# Patient Record
Sex: Female | Born: 1955 | Race: Black or African American | Hispanic: No | Marital: Married | Smoking: Never smoker
Health system: Southern US, Community
[De-identification: ages and names within clinical notes are randomized; demographics above are authoritative.]

## PROBLEM LIST (undated history)

## (undated) DIAGNOSIS — Z923 Personal history of irradiation: Secondary | ICD-10-CM

## (undated) DIAGNOSIS — C069 Malignant neoplasm of mouth, unspecified: Secondary | ICD-10-CM

## (undated) DIAGNOSIS — D649 Anemia, unspecified: Secondary | ICD-10-CM

## (undated) DIAGNOSIS — D573 Sickle-cell trait: Secondary | ICD-10-CM

---

## 2000-04-30 ENCOUNTER — Other Ambulatory Visit: Admission: RE | Admit: 2000-04-30 | Discharge: 2000-04-30 | Payer: Self-pay | Admitting: Obstetrics and Gynecology

## 2001-04-29 ENCOUNTER — Other Ambulatory Visit: Admission: RE | Admit: 2001-04-29 | Discharge: 2001-04-29 | Payer: Self-pay | Admitting: Obstetrics and Gynecology

## 2001-05-14 ENCOUNTER — Ambulatory Visit (HOSPITAL_COMMUNITY): Admission: RE | Admit: 2001-05-14 | Discharge: 2001-05-14 | Payer: Self-pay | Admitting: Obstetrics and Gynecology

## 2001-05-14 ENCOUNTER — Encounter: Payer: Self-pay | Admitting: Obstetrics and Gynecology

## 2004-03-02 ENCOUNTER — Ambulatory Visit (HOSPITAL_COMMUNITY): Admission: RE | Admit: 2004-03-02 | Discharge: 2004-03-02 | Payer: Self-pay | Admitting: Orthopaedic Surgery

## 2004-06-03 ENCOUNTER — Ambulatory Visit (HOSPITAL_COMMUNITY): Admission: RE | Admit: 2004-06-03 | Discharge: 2004-06-03 | Payer: Self-pay | Admitting: Obstetrics and Gynecology

## 2005-07-04 ENCOUNTER — Inpatient Hospital Stay (HOSPITAL_COMMUNITY): Admission: RE | Admit: 2005-07-04 | Discharge: 2005-07-05 | Payer: Self-pay | Admitting: Obstetrics and Gynecology

## 2013-11-12 ENCOUNTER — Telehealth: Payer: Self-pay | Admitting: Internal Medicine

## 2013-11-12 NOTE — Telephone Encounter (Signed)
LEFT MESSAGE FOR PATIENT TO RETURN CALL TO SCHEDULE NP APPT.  °

## 2013-11-18 ENCOUNTER — Telehealth: Payer: Self-pay | Admitting: Internal Medicine

## 2013-11-18 NOTE — Telephone Encounter (Signed)
LEFT MESSAGE FOR PATIENT TO RETURN CALL TO SCHEDULE NP APPT.  °

## 2015-03-22 ENCOUNTER — Other Ambulatory Visit: Payer: Self-pay | Admitting: Family Medicine

## 2015-03-22 DIAGNOSIS — R936 Abnormal findings on diagnostic imaging of limbs: Secondary | ICD-10-CM

## 2015-03-22 DIAGNOSIS — M1712 Unilateral primary osteoarthritis, left knee: Secondary | ICD-10-CM

## 2015-03-24 ENCOUNTER — Inpatient Hospital Stay: Admission: RE | Admit: 2015-03-24 | Payer: Self-pay | Source: Ambulatory Visit

## 2016-05-31 ENCOUNTER — Inpatient Hospital Stay (HOSPITAL_COMMUNITY)
Admission: EM | Admit: 2016-05-31 | Discharge: 2016-06-05 | DRG: 550 | Disposition: A | Discharge status: Home Health | Payer: BLUE CROSS/BLUE SHIELD | Attending: Internal Medicine | Admitting: Internal Medicine

## 2016-05-31 ENCOUNTER — Emergency Department (HOSPITAL_COMMUNITY): Payer: BLUE CROSS/BLUE SHIELD

## 2016-05-31 ENCOUNTER — Encounter (HOSPITAL_COMMUNITY): Payer: Self-pay | Admitting: Emergency Medicine

## 2016-05-31 DIAGNOSIS — Z832 Family history of diseases of the blood and blood-forming organs and certain disorders involving the immune mechanism: Secondary | ICD-10-CM

## 2016-05-31 DIAGNOSIS — R519 Headache, unspecified: Secondary | ICD-10-CM

## 2016-05-31 DIAGNOSIS — M79671 Pain in right foot: Secondary | ICD-10-CM

## 2016-05-31 DIAGNOSIS — D649 Anemia, unspecified: Secondary | ICD-10-CM | POA: Diagnosis present

## 2016-05-31 DIAGNOSIS — M25571 Pain in right ankle and joints of right foot: Secondary | ICD-10-CM | POA: Diagnosis present

## 2016-05-31 DIAGNOSIS — A4189 Other specified sepsis: Secondary | ICD-10-CM | POA: Diagnosis present

## 2016-05-31 DIAGNOSIS — D573 Sickle-cell trait: Secondary | ICD-10-CM | POA: Diagnosis present

## 2016-05-31 DIAGNOSIS — M009 Pyogenic arthritis, unspecified: Secondary | ICD-10-CM | POA: Diagnosis present

## 2016-05-31 DIAGNOSIS — B999 Unspecified infectious disease: Secondary | ICD-10-CM

## 2016-05-31 DIAGNOSIS — D509 Iron deficiency anemia, unspecified: Secondary | ICD-10-CM | POA: Diagnosis present

## 2016-05-31 DIAGNOSIS — R51 Headache: Secondary | ICD-10-CM

## 2016-05-31 DIAGNOSIS — M00262 Other streptococcal arthritis, left knee: Secondary | ICD-10-CM | POA: Diagnosis not present

## 2016-05-31 DIAGNOSIS — R509 Fever, unspecified: Secondary | ICD-10-CM

## 2016-05-31 DIAGNOSIS — B954 Other streptococcus as the cause of diseases classified elsewhere: Secondary | ICD-10-CM | POA: Diagnosis present

## 2016-05-31 DIAGNOSIS — M00062 Staphylococcal arthritis, left knee: Secondary | ICD-10-CM

## 2016-05-31 DIAGNOSIS — I00 Rheumatic fever without heart involvement: Secondary | ICD-10-CM | POA: Diagnosis present

## 2016-05-31 DIAGNOSIS — A419 Sepsis, unspecified organism: Secondary | ICD-10-CM

## 2016-05-31 HISTORY — DX: Anemia, unspecified: D64.9

## 2016-05-31 LAB — LIPASE, BLOOD: LIPASE: 19 U/L (ref 11–51)

## 2016-05-31 LAB — COMPREHENSIVE METABOLIC PANEL
ALT: 27 U/L (ref 14–54)
ANION GAP: 9 (ref 5–15)
AST: 39 U/L (ref 15–41)
Albumin: 3.9 g/dL (ref 3.5–5.0)
Alkaline Phosphatase: 78 U/L (ref 38–126)
BUN: 15 mg/dL (ref 6–20)
CHLORIDE: 110 mmol/L (ref 101–111)
CO2: 23 mmol/L (ref 22–32)
Calcium: 8.9 mg/dL (ref 8.9–10.3)
Creatinine, Ser: 0.67 mg/dL (ref 0.44–1.00)
Glucose, Bld: 186 mg/dL — ABNORMAL HIGH (ref 65–99)
POTASSIUM: 2.8 mmol/L — AB (ref 3.5–5.1)
Sodium: 142 mmol/L (ref 135–145)
TOTAL PROTEIN: 7.4 g/dL (ref 6.5–8.1)
Total Bilirubin: 4.9 mg/dL — ABNORMAL HIGH (ref 0.3–1.2)

## 2016-05-31 LAB — CBC
HEMATOCRIT: 21.9 % — AB (ref 36.0–46.0)
Hemoglobin: 7.4 g/dL — ABNORMAL LOW (ref 12.0–15.0)
MCH: 22.9 pg — ABNORMAL LOW (ref 26.0–34.0)
MCHC: 33.8 g/dL (ref 30.0–36.0)
MCV: 67.8 fL — AB (ref 78.0–100.0)
Platelets: 256 10*3/uL (ref 150–400)
RBC: 3.23 MIL/uL — AB (ref 3.87–5.11)
RDW: 17.4 % — ABNORMAL HIGH (ref 11.5–15.5)
WBC: 34.1 10*3/uL — AB (ref 4.0–10.5)

## 2016-05-31 LAB — MAGNESIUM: Magnesium: 2.1 mg/dL (ref 1.7–2.4)

## 2016-05-31 LAB — CK: CK TOTAL: 112 U/L (ref 38–234)

## 2016-05-31 MED ORDER — HYDROMORPHONE HCL 2 MG/ML IJ SOLN
1.0000 mg | Freq: Once | INTRAMUSCULAR | Status: AC
  Administered 2016-05-31: 1 mg via INTRAVENOUS
  Filled 2016-05-31: qty 1

## 2016-05-31 MED ORDER — SODIUM CHLORIDE 0.9 % IV BOLUS (SEPSIS)
1000.0000 mL | Freq: Once | INTRAVENOUS | Status: AC
  Administered 2016-05-31: 1000 mL via INTRAVENOUS

## 2016-05-31 MED ORDER — LIDOCAINE-EPINEPHRINE (PF) 2 %-1:200000 IJ SOLN
20.0000 mL | Freq: Once | INTRAMUSCULAR | Status: AC
  Administered 2016-05-31: 20 mL via INTRADERMAL
  Filled 2016-05-31: qty 20

## 2016-05-31 MED ORDER — ACETAMINOPHEN 500 MG PO TABS
1000.0000 mg | ORAL_TABLET | Freq: Once | ORAL | Status: AC
  Administered 2016-05-31: 1000 mg via ORAL
  Filled 2016-05-31: qty 2

## 2016-05-31 MED ORDER — POTASSIUM CHLORIDE CRYS ER 20 MEQ PO TBCR
40.0000 meq | EXTENDED_RELEASE_TABLET | Freq: Once | ORAL | Status: AC
  Administered 2016-05-31: 40 meq via ORAL
  Filled 2016-05-31: qty 2

## 2016-05-31 NOTE — ED Triage Notes (Signed)
Pt from home with complaints of fever (104 for EMS, 101.5 at time of assessment) EMS administered 1g tylenol in route as well as 4 mg of zofran. Pt is tachycardic and is having a difficult time focusing on triage questions. Pt  States symptoms began yesterday when she began feeling nauseous and experienced 2 episodes of emesis. Pt denies emesis today.   CBG 299

## 2016-05-31 NOTE — ED Notes (Signed)
Attempted lab draws x 2 but unsuccessful.RN,Abbie made aware.

## 2016-05-31 NOTE — ED Provider Notes (Signed)
[facility] DEPT Provider Note   CSN: [id number] Arrival date & time: 05/31/16  1810     History   Chief Complaint Chief Complaint  Patient presents with  . Fever  . Generalized Body Aches  . Nausea    HPI Angie Wright is a 60 y.o. female.  HPI  60 year old female presents with bilateral thigh pain and weakness. She states that yesterday she developed vomiting in the morning that has since resolved. She has felt intermittently nauseated. She was found to have a fever here of 101.5 but did not have a fever at home. She states her bilateral anterior thighs have been sore and hurting. She has also had recurrent left knee swelling starting yesterday without redness or warmth. She has previously had this drained. Also complaining of atraumatic right foot pain. Denies headaches, cough, chest pain, abdominal pain, or urinary symptoms. Feels generally weak. No trauma to left knee.  Past Medical History:  Diagnosis Date  . Anemia    states "sickle cell trait, HGB always runs between 7 and 8"    Patient Active Problem List   Diagnosis Date Noted  . Septic arthritis of knee, left ([place]) 06/01/2016  . Sepsis, Gram positive ([place]) 06/01/2016  . Anemia 06/01/2016  . Sickle cell trait ([place]) 06/01/2016    History reviewed. No pertinent surgical history.  OB History    No data available       Home Medications    Prior to Admission medications   Medication Sig Start Date End Date Taking? Authorizing Provider  calcium carbonate (OS-CAL - DOSED IN MG OF ELEMENTAL CALCIUM) 1250 (500 Ca) MG tablet Take 1 tablet by mouth daily with breakfast.   Yes Historical Provider, MD  cholecalciferol (VITAMIN D) 1000 units tablet Take 1,000 Units by mouth daily.   Yes Historical Provider, MD  Multiple Vitamin (MULTIVITAMIN WITH MINERALS) TABS tablet Take 1 tablet by mouth daily.   Yes Historical Provider, MD    Family History Family History  Problem Relation Age of Onset  . Sickle cell  anemia Brother     2 brothers passed with sickle cell anemia    Social History Social History  Substance Use Topics  . Smoking status: Never Smoker  . Smokeless tobacco: Never Used  . Alcohol use No     Allergies   Patient has no known allergies.   Review of Systems Review of Systems  Constitutional: Negative for fever.  Respiratory: Negative for cough and shortness of breath.   Cardiovascular: Negative for chest pain.  Gastrointestinal: Positive for nausea and vomiting. Negative for abdominal pain.  Genitourinary: Negative for dysuria.  Musculoskeletal: Positive for arthralgias, joint swelling and myalgias.  Neurological: Positive for weakness.  All other systems reviewed and are negative.    Physical Exam Updated Vital Signs BP 154/76   Pulse 114   Temp 99.2 F (37.3 C) (Oral)   Resp 18   Ht 5\' 6"  (1.676 m)   Wt 172 lb (78 kg)   SpO2 100%   BMI 27.76 kg/m   Physical Exam  Constitutional: She is oriented to person, place, and time. She appears well-developed and well-nourished. No distress.  HENT:  Head: Normocephalic and atraumatic.  Right Ear: External ear normal.  Left Ear: External ear normal.  Nose: Nose normal.  Eyes: Right eye exhibits no discharge. Left eye exhibits no discharge.  Cardiovascular: Regular rhythm and normal heart sounds.  Tachycardia present.   Pulmonary/Chest: Effort normal and breath sounds normal.  Abdominal: Soft.  There is no tenderness.  Musculoskeletal:       Left knee: She exhibits decreased range of motion, swelling and effusion. She exhibits no erythema.       Right upper leg: She exhibits no tenderness.       Left upper leg: She exhibits no tenderness.       Right foot: There is tenderness.       Feet:  Left knee with decreased ROM and effusion. No warmth or erythema.  Neurological: She is alert and oriented to person, place, and time.  Skin: Skin is warm and dry. She is not diaphoretic.  Nursing note and vitals  reviewed.    ED Treatments / Results  Labs (all labs ordered are listed, but only abnormal results are displayed) Labs Reviewed  COMPREHENSIVE METABOLIC PANEL - Abnormal; Notable for the following:       Result Value   Potassium 2.8 (*)    Glucose, Bld 186 (*)    Total Bilirubin 4.9 (*)    All other components within normal limits  CBC - Abnormal; Notable for the following:    WBC 34.1 (*)    RBC 3.23 (*)    Hemoglobin 7.4 (*)    HCT 21.9 (*)    MCV 67.8 (*)    MCH 22.9 (*)    RDW 17.4 (*)    All other components within normal limits  URINALYSIS, ROUTINE W REFLEX MICROSCOPIC - Abnormal; Notable for the following:    APPearance HAZY (*)    Glucose, UA 50 (*)    Hgb urine dipstick MODERATE (*)    Protein, ur >=300 (*)    Leukocytes, UA MODERATE (*)    Bacteria, UA RARE (*)    Squamous Epithelial / LPF 0-5 (*)    All other components within normal limits  SYNOVIAL CELL COUNT + DIFF, W/ CRYSTALS - Abnormal; Notable for the following:    Appearance-Synovial TURBID (*)    WBC, Synovial 79,200 (*)    Neutrophil, Synovial 82 (*)    Monocyte-Macrophage-Synovial Fluid 16 (*)    All other components within normal limits  DIFFERENTIAL - Abnormal; Notable for the following:    Neutro Abs 33.1 (*)    Lymphs Abs 0.3 (*)    All other components within normal limits  BODY FLUID CULTURE  CULTURE, BLOOD (ROUTINE X 2)  CULTURE, BLOOD (ROUTINE X 2)  LIPASE, BLOOD  MAGNESIUM  CK  GLUCOSE, SYNOVIAL FLUID  PROTEIN, SYNOVIAL FLUID  PATHOLOGIST SMEAR REVIEW  CBC  BASIC METABOLIC PANEL  VITAMIN 123456  FOLATE  IRON AND TIBC  FERRITIN  RETICULOCYTES  HEMOGLOBINOPATHY EVALUATION  I-STAT CG4 LACTIC ACID, ED  TYPE AND SCREEN    EKG  EKG Interpretation None       Radiology Dg Chest 2 View  Result Date: 06/01/2016 CLINICAL DATA:  Acute onset of fever and disorientation. Initial encounter. EXAM: CHEST  2 VIEW COMPARISON:  None. FINDINGS: The lungs are well-aerated. Vascular  congestion is noted. Minimal bilateral opacities may reflect atelectasis or possibly mild infection. There is no evidence of pleural effusion or pneumothorax. The heart is enlarged. No acute osseous abnormalities are seen. Chronic sclerosis is seen at the humeral heads bilaterally. IMPRESSION: Vascular congestion and cardiomegaly noted. Minimal bilateral opacities may reflect atelectasis or possibly mild infection. Electronically Signed   By: Garald Balding M.D.   On: 06/01/2016 00:03   Dg Foot Complete Right  Result Date: 06/01/2016 CLINICAL DATA:  Acute onset of right dorsal foot pain  and swelling. Initial encounter. EXAM: RIGHT FOOT COMPLETE - 3+ VIEW COMPARISON:  None. FINDINGS: There is no evidence of fracture or dislocation. Degenerative change is noted at the medial aspect of the midfoot. There is no evidence of talar subluxation; the subtalar joint is unremarkable in appearance. A plantar calcaneal spur is seen. No significant soft tissue abnormalities are seen. IMPRESSION: 1. No evidence of fracture or dislocation. 2. Degenerative change at the medial aspect of the midfoot. Electronically Signed   By: Garald Balding M.D.   On: 06/01/2016 00:04   US Abdomen Limited Ruq  Result Date: 06/01/2016 CLINICAL DATA:  Acute onset of fever.  Initial encounter. EXAM: US ABDOMEN LIMITED - RIGHT UPPER QUADRANT COMPARISON:  None. FINDINGS: Gallbladder: Multiple layering stones are seen dependently within the gallbladder, measuring up to 9 mm in size. No gallbladder wall thickening or pericholecystic fluid is seen. No ultrasonographic Murphy's sign is elicited. Common bile duct: Diameter: 0.4 cm, within normal limits in caliber. Liver: No focal lesion identified. Within normal limits in parenchymal echogenicity. IMPRESSION: Cholelithiasis. Gallbladder otherwise unremarkable. No evidence for cholecystitis or obstruction. Electronically Signed   By: Garald Balding M.D.   On: 06/01/2016 01:19     Procedures .Joint Aspiration/Arthrocentesis Date/Time: 06/01/2016 12:42 AM Performed by: Sherwood Gambler Authorized by: Sherwood Gambler   Consent:    Consent obtained:  Verbal and written   Consent given by:  Patient   Risks discussed:  Bleeding, incomplete drainage, nerve damage, infection, pain and poor cosmetic result   Alternatives discussed:  No treatment Location:    Location:  Knee   Knee:  L knee Anesthesia (see MAR for exact dosages):    Anesthesia method:  Local infiltration   Local anesthetic:  Lidocaine 2% WITH epi Procedure details:    Preparation: Patient was prepped and draped in usual sterile fashion     Needle gauge:  18 G   Ultrasound guidance: no     Approach:  Lateral   Aspirate amount:  25 cc   Aspirate characteristics:  Cloudy and blood-tinged   Steroid injected: no   Post-procedure details:    Dressing:  Adhesive bandage   Patient tolerance of procedure:  Tolerated well, no immediate complications   (including critical care time)  CRITICAL CARE Performed by: Sherwood Gambler T   Total critical care time: 30 minutes  Critical care time was exclusive of separately billable procedures and treating other patients.  Critical care was necessary to treat or prevent imminent or life-threatening deterioration.  Critical care was time spent personally by me on the following activities: development of treatment plan with patient and/or surrogate as well as nursing, discussions with consultants, evaluation of patient's response to treatment, examination of patient, obtaining history from patient or surrogate, ordering and performing treatments and interventions, ordering and review of laboratory studies, ordering and review of radiographic studies, pulse oximetry and re-evaluation of patient's condition.   Medications Ordered in ED Medications  vancomycin (VANCOCIN) IVPB 1000 mg/200 mL premix (1,000 mg Intravenous New Bag/Given 06/01/16 0311)  0.9 %   sodium chloride infusion (not administered)  HYDROmorphone (DILAUDID) injection 0.5-1 mg (not administered)  ondansetron (ZOFRAN) injection 4 mg (4 mg Intravenous Given 06/01/16 0238)  sodium chloride flush (NS) 0.9 % injection 3 mL (not administered)  acetaminophen (TYLENOL) tablet 1,000 mg (1,000 mg Oral Given 05/31/16 2349)  sodium chloride 0.9 % bolus 1,000 mL (1,000 mLs Intravenous New Bag/Given 05/31/16 2352)  HYDROmorphone (DILAUDID) injection 1 mg (1 mg Intravenous Given 05/31/16 2352)  lidocaine-EPINEPHrine (XYLOCAINE W/EPI) 2 %-1:200000 (PF) injection 20 mL (20 mLs Intradermal Given 05/31/16 2352)  potassium chloride SA (K-DUR,KLOR-CON) CR tablet 40 mEq (40 mEq Oral Given 05/31/16 2349)  sodium chloride 0.9 % bolus 1,000 mL (1,000 mLs Intravenous New Bag/Given 06/01/16 0311)     Initial Impression / Assessment and Plan / ED Course  I have reviewed the triage vital signs and the nursing notes.  Pertinent labs & imaging results that were available during my care of the patient were reviewed by me and considered in my medical decision making (see chart for details).  Clinical Course as of Jun 02 343  Wed May 31, 2016  2316 There is no obvious cause of her fever. She has a left knee effusion but without warmth or redness. However this will need to be tapped. Some right flank pain, given bilirubin will get RUQ u/s. Urine, CXR, labs, fluids, lactate, dilaudid for pain and tylenol for fever  [SG]  Thu Jun 01, 2016  0041 Will start on antibiotics for possible septic arthritis, continue fluids, npo  [SG]  0210 Patient's synovial fluid consistent with septic joint. Gram stain shows gram-positive cocci. Consult orthopedics.  [SG]  [id number] Dr Rolena Infante will take to OR around 5 or 6 AM at [facility]. Requests 5N bed and for him to be called when she gets to her room. NPO. Hospitalist to admit.  [SG]    Clinical Course User Index [SG] Sherwood Gambler, MD    Patient appears to have left septic knee  arthritis that is probably causing her fever and other acute symptoms. Due to this she was originally given vancomycin and Rocephin because of ill-appearing fluid on the tap. Gram stain has come back with gram-positive cocci so the Rocephin will be discontinued. No prior knee surgeries or hardware. No injuries. Patient meets sepsis criteria with high white count, fever, and tachycardia. However she has not been hypotensive or having evidence of poor perfusion. Unable to get lactic acid as nurses and techs are having a hard time getting repeat blood work after the original stick from triage. Also unable to get blood cultures at this time. She does have IV access and thus was already given antibiotics prior to blood cultures. She will be admitted to [facility] and have a washout later this morning around 5-6 AM.  Final Clinical Impressions(s) / ED Diagnoses   Final diagnoses:  Fever in adult  Staphylococcal arthritis of left knee ([place])  Sepsis, due to unspecified organism [facility])    New Prescriptions New Prescriptions   No medications on file     Sherwood Gambler, MD 06/01/16 0405

## 2016-06-01 ENCOUNTER — Inpatient Hospital Stay (HOSPITAL_COMMUNITY): Payer: BLUE CROSS/BLUE SHIELD

## 2016-06-01 ENCOUNTER — Inpatient Hospital Stay (HOSPITAL_COMMUNITY): Payer: BLUE CROSS/BLUE SHIELD | Admitting: Certified Registered"

## 2016-06-01 ENCOUNTER — Encounter (HOSPITAL_COMMUNITY): Payer: Self-pay | Admitting: Internal Medicine

## 2016-06-01 ENCOUNTER — Encounter (HOSPITAL_COMMUNITY)
Admission: EM | Disposition: A | Discharge status: Home Health | Payer: Self-pay | Source: Home / Self Care | Attending: Internal Medicine

## 2016-06-01 ENCOUNTER — Emergency Department (HOSPITAL_COMMUNITY): Payer: BLUE CROSS/BLUE SHIELD

## 2016-06-01 DIAGNOSIS — M7989 Other specified soft tissue disorders: Secondary | ICD-10-CM | POA: Diagnosis not present

## 2016-06-01 DIAGNOSIS — Z832 Family history of diseases of the blood and blood-forming organs and certain disorders involving the immune mechanism: Secondary | ICD-10-CM | POA: Diagnosis not present

## 2016-06-01 DIAGNOSIS — D573 Sickle-cell trait: Secondary | ICD-10-CM | POA: Diagnosis present

## 2016-06-01 DIAGNOSIS — B954 Other streptococcus as the cause of diseases classified elsewhere: Secondary | ICD-10-CM | POA: Diagnosis present

## 2016-06-01 DIAGNOSIS — B9689 Other specified bacterial agents as the cause of diseases classified elsewhere: Secondary | ICD-10-CM | POA: Diagnosis not present

## 2016-06-01 DIAGNOSIS — M00862 Arthritis due to other bacteria, left knee: Secondary | ICD-10-CM | POA: Diagnosis not present

## 2016-06-01 DIAGNOSIS — D644 Congenital dyserythropoietic anemia: Secondary | ICD-10-CM

## 2016-06-01 DIAGNOSIS — M25571 Pain in right ankle and joints of right foot: Secondary | ICD-10-CM | POA: Diagnosis present

## 2016-06-01 DIAGNOSIS — Z9889 Other specified postprocedural states: Secondary | ICD-10-CM | POA: Diagnosis not present

## 2016-06-01 DIAGNOSIS — I00 Rheumatic fever without heart involvement: Secondary | ICD-10-CM | POA: Diagnosis present

## 2016-06-01 DIAGNOSIS — M79671 Pain in right foot: Secondary | ICD-10-CM | POA: Diagnosis not present

## 2016-06-01 DIAGNOSIS — D509 Iron deficiency anemia, unspecified: Secondary | ICD-10-CM | POA: Diagnosis present

## 2016-06-01 DIAGNOSIS — D649 Anemia, unspecified: Secondary | ICD-10-CM | POA: Diagnosis present

## 2016-06-01 DIAGNOSIS — B953 Streptococcus pneumoniae as the cause of diseases classified elsewhere: Secondary | ICD-10-CM | POA: Diagnosis not present

## 2016-06-01 DIAGNOSIS — R0600 Dyspnea, unspecified: Secondary | ICD-10-CM | POA: Diagnosis not present

## 2016-06-01 DIAGNOSIS — A4189 Other specified sepsis: Secondary | ICD-10-CM | POA: Diagnosis not present

## 2016-06-01 DIAGNOSIS — R509 Fever, unspecified: Secondary | ICD-10-CM | POA: Diagnosis present

## 2016-06-01 DIAGNOSIS — M009 Pyogenic arthritis, unspecified: Secondary | ICD-10-CM

## 2016-06-01 DIAGNOSIS — M00262 Other streptococcal arthritis, left knee: Secondary | ICD-10-CM | POA: Diagnosis present

## 2016-06-01 HISTORY — PX: KNEE ARTHROSCOPY: SHX127

## 2016-06-01 LAB — DIFFERENTIAL
BAND NEUTROPHILS: 0 %
BASOS ABS: 0 10*3/uL (ref 0.0–0.1)
Basophils Relative: 0 %
Blasts: 0 %
EOS ABS: 0 10*3/uL (ref 0.0–0.7)
Eosinophils Relative: 0 %
LYMPHS PCT: 1 %
Lymphs Abs: 0.3 10*3/uL — ABNORMAL LOW (ref 0.7–4.0)
Metamyelocytes Relative: 0 %
Monocytes Absolute: 0.7 10*3/uL (ref 0.1–1.0)
Monocytes Relative: 2 %
Myelocytes: 0 %
NEUTROS ABS: 33.1 10*3/uL — AB (ref 1.7–7.7)
NEUTROS PCT: 97 %
Other: 0 %
PROMYELOCYTES ABS: 0 %
nRBC: 0 /100 WBC

## 2016-06-01 LAB — CBC
HCT: 18.4 % — ABNORMAL LOW (ref 36.0–46.0)
HEMOGLOBIN: 6.2 g/dL — AB (ref 12.0–15.0)
MCH: 23.1 pg — AB (ref 26.0–34.0)
MCHC: 33.7 g/dL (ref 30.0–36.0)
MCV: 68.7 fL — AB (ref 78.0–100.0)
Platelets: 205 10*3/uL (ref 150–400)
RBC: 2.68 MIL/uL — AB (ref 3.87–5.11)
RDW: 16.4 % — ABNORMAL HIGH (ref 11.5–15.5)
WBC: 34.7 10*3/uL — ABNORMAL HIGH (ref 4.0–10.5)

## 2016-06-01 LAB — GLUCOSE, SYNOVIAL FLUID: Glucose, Synovial Fluid: <10 mg/dL

## 2016-06-01 LAB — BASIC METABOLIC PANEL
ANION GAP: 5 (ref 5–15)
BUN: 12 mg/dL (ref 6–20)
CHLORIDE: 116 mmol/L — AB (ref 101–111)
CO2: 24 mmol/L (ref 22–32)
Calcium: 8.3 mg/dL — ABNORMAL LOW (ref 8.9–10.3)
Creatinine, Ser: 0.63 mg/dL (ref 0.44–1.00)
GFR calc non Af Amer: >60 mL/min (ref >60)
Glucose, Bld: 154 mg/dL — ABNORMAL HIGH (ref 65–99)
POTASSIUM: 3.3 mmol/L — AB (ref 3.5–5.1)
SODIUM: 145 mmol/L (ref 135–145)

## 2016-06-01 LAB — RETICULOCYTES
RBC.: 2.67 MIL/uL — AB (ref 3.87–5.11)
Retic Count, Absolute: 128.2 10*3/uL (ref 19.0–186.0)
Retic Ct Pct: 4.8 % — ABNORMAL HIGH (ref 0.4–3.1)

## 2016-06-01 LAB — IRON AND TIBC
IRON: 8 ug/dL — AB (ref 28–170)
Saturation Ratios: 4 % — ABNORMAL LOW (ref 10.4–31.8)
TIBC: 220 ug/dL — AB (ref 250–450)
UIBC: 212 ug/dL

## 2016-06-01 LAB — URINALYSIS, ROUTINE W REFLEX MICROSCOPIC
Bilirubin Urine: NEGATIVE
GLUCOSE, UA: 50 mg/dL — AB
KETONES UR: NEGATIVE mg/dL
NITRITE: NEGATIVE
Protein, ur: >=300 mg/dL — AB
Specific Gravity, Urine: 1.012 (ref 1.005–1.030)
pH: 5 (ref 5.0–8.0)

## 2016-06-01 LAB — SURGICAL PCR SCREEN
MRSA, PCR: NEGATIVE
STAPHYLOCOCCUS AUREUS: NEGATIVE

## 2016-06-01 LAB — SYNOVIAL CELL COUNT + DIFF, W/ CRYSTALS
CRYSTALS FLUID: NONE SEEN
Lymphocytes-Synovial Fld: 2 % (ref 0–20)
Monocyte-Macrophage-Synovial Fluid: 16 % — ABNORMAL LOW (ref 50–90)
Neutrophil, Synovial: 82 % — ABNORMAL HIGH (ref 0–25)
WBC, SYNOVIAL: 79200 /mm3 — AB (ref 0–200)

## 2016-06-01 LAB — CREATININE, SERUM
CREATININE: 0.68 mg/dL (ref 0.44–1.00)
GFR calc Af Amer: >60 mL/min (ref >60)
GFR calc non Af Amer: >60 mL/min (ref >60)

## 2016-06-01 LAB — VITAMIN B12: VITAMIN B 12: 1670 pg/mL — AB (ref 180–914)

## 2016-06-01 LAB — ABO/RH: ABO/RH(D): O POS

## 2016-06-01 LAB — FOLATE: FOLATE: 15.8 ng/mL (ref >5.9)

## 2016-06-01 LAB — PROTEIN, SYNOVIAL FLUID: PROTEIN, SYNOVIAL FLUID: 1.5 g/dL (ref 1.0–3.0)

## 2016-06-01 LAB — FERRITIN: Ferritin: 321 ng/mL — ABNORMAL HIGH (ref 11–307)

## 2016-06-01 LAB — PREPARE RBC (CROSSMATCH)

## 2016-06-01 SURGERY — ARTHROSCOPY, KNEE
Anesthesia: General | Site: Knee | Laterality: Left

## 2016-06-01 MED ORDER — LACTATED RINGERS IV SOLN
INTRAVENOUS | Status: DC | PRN
  Administered 2016-06-01: 06:00:00 via INTRAVENOUS

## 2016-06-01 MED ORDER — ONDANSETRON HCL 4 MG/2ML IJ SOLN
INTRAMUSCULAR | Status: DC | PRN
  Administered 2016-06-01: 4 mg via INTRAVENOUS

## 2016-06-01 MED ORDER — SODIUM CHLORIDE 0.9 % IR SOLN
Status: DC | PRN
  Administered 2016-06-01 (×3): 3000 mL

## 2016-06-01 MED ORDER — SUFENTANIL CITRATE 50 MCG/ML IV SOLN
INTRAVENOUS | Status: DC | PRN
  Administered 2016-06-01: 10 ug via INTRAVENOUS

## 2016-06-01 MED ORDER — HYDROCODONE-ACETAMINOPHEN 7.5-325 MG PO TABS
1.0000 | ORAL_TABLET | Freq: Four times a day (QID) | ORAL | Status: DC | PRN
  Administered 2016-06-01 – 2016-06-05 (×8): 1 via ORAL
  Filled 2016-06-01 (×10): qty 1

## 2016-06-01 MED ORDER — DEXTROSE 5 % IV SOLN: 2.0000 g | Freq: Every day | INTRAVENOUS | Status: DC

## 2016-06-01 MED ORDER — METHOCARBAMOL 500 MG PO TABS
500.0000 mg | ORAL_TABLET | Freq: Four times a day (QID) | ORAL | Status: DC | PRN
  Administered 2016-06-02 – 2016-06-05 (×8): 500 mg via ORAL
  Filled 2016-06-01 (×8): qty 1

## 2016-06-01 MED ORDER — ENOXAPARIN SODIUM 40 MG/0.4ML ~~LOC~~ SOLN
40.0000 mg | Freq: Every day | SUBCUTANEOUS | Status: DC
  Administered 2016-06-01 – 2016-06-05 (×5): 40 mg via SUBCUTANEOUS
  Filled 2016-06-01 (×6): qty 0.4

## 2016-06-01 MED ORDER — DEXTROSE 5 % IV SOLN
2.0000 g | INTRAVENOUS | Status: DC
  Administered 2016-06-01: 2 g via INTRAVENOUS
  Filled 2016-06-01: qty 2

## 2016-06-01 MED ORDER — SUCCINYLCHOLINE CHLORIDE 200 MG/10ML IV SOSY
PREFILLED_SYRINGE | INTRAVENOUS | Status: DC | PRN
  Administered 2016-06-01: 120 mg via INTRAVENOUS

## 2016-06-01 MED ORDER — ACETAMINOPHEN 650 MG RE SUPP: 650.0000 mg | Freq: Four times a day (QID) | RECTAL | Status: DC | PRN

## 2016-06-01 MED ORDER — SODIUM CHLORIDE 0.9 % IV SOLN
INTRAVENOUS | Status: DC
  Administered 2016-06-01 – 2016-06-02 (×2): via INTRAVENOUS

## 2016-06-01 MED ORDER — SODIUM CHLORIDE 0.9% FLUSH: 3.0000 mL | Freq: Two times a day (BID) | INTRAVENOUS | Status: DC

## 2016-06-01 MED ORDER — PROPOFOL 10 MG/ML IV BOLUS
INTRAVENOUS | Status: AC
  Filled 2016-06-01: qty 20

## 2016-06-01 MED ORDER — VANCOMYCIN HCL IN DEXTROSE 1-5 GM/200ML-% IV SOLN
1000.0000 mg | Freq: Two times a day (BID) | INTRAVENOUS | Status: DC
  Administered 2016-06-01: 1000 mg via INTRAVENOUS
  Filled 2016-06-01 (×2): qty 200

## 2016-06-01 MED ORDER — DEXTROSE 5 % IV SOLN
2.0000 g | INTRAVENOUS | Status: DC
  Administered 2016-06-02 – 2016-06-05 (×4): 2 g via INTRAVENOUS
  Filled 2016-06-01 (×4): qty 2

## 2016-06-01 MED ORDER — METOCLOPRAMIDE HCL 5 MG/ML IJ SOLN: 5.0000 mg | Freq: Three times a day (TID) | INTRAMUSCULAR | Status: DC | PRN

## 2016-06-01 MED ORDER — ONDANSETRON HCL 4 MG/2ML IJ SOLN
4.0000 mg | Freq: Four times a day (QID) | INTRAMUSCULAR | Status: DC | PRN
  Administered 2016-06-01 – 2016-06-02 (×4): 4 mg via INTRAVENOUS
  Filled 2016-06-01 (×5): qty 2

## 2016-06-01 MED ORDER — SODIUM CHLORIDE 0.9 % IV BOLUS (SEPSIS)
1000.0000 mL | Freq: Once | INTRAVENOUS | Status: AC
  Administered 2016-06-01: 1000 mL via INTRAVENOUS

## 2016-06-01 MED ORDER — SODIUM CHLORIDE 0.9 % IV SOLN
Freq: Once | INTRAVENOUS | Status: AC
  Administered 2016-06-01: 13:00:00 via INTRAVENOUS

## 2016-06-01 MED ORDER — ONDANSETRON HCL 4 MG PO TABS: 4.0000 mg | ORAL_TABLET | Freq: Four times a day (QID) | ORAL | Status: DC | PRN

## 2016-06-01 MED ORDER — MIDAZOLAM HCL 2 MG/2ML IJ SOLN
INTRAMUSCULAR | Status: AC
  Filled 2016-06-01: qty 2

## 2016-06-01 MED ORDER — ONDANSETRON HCL 4 MG/2ML IJ SOLN
4.0000 mg | Freq: Four times a day (QID) | INTRAMUSCULAR | Status: DC | PRN
  Administered 2016-06-01: 4 mg via INTRAVENOUS
  Filled 2016-06-01: qty 2

## 2016-06-01 MED ORDER — FERROUS SULFATE 325 (65 FE) MG PO TABS
325.0000 mg | ORAL_TABLET | Freq: Three times a day (TID) | ORAL | Status: DC
  Administered 2016-06-02 – 2016-06-05 (×11): 325 mg via ORAL
  Filled 2016-06-01 (×11): qty 1

## 2016-06-01 MED ORDER — ACETAMINOPHEN 325 MG PO TABS
650.0000 mg | ORAL_TABLET | Freq: Four times a day (QID) | ORAL | Status: DC | PRN
  Administered 2016-06-01 – 2016-06-03 (×4): 650 mg via ORAL
  Filled 2016-06-01 (×4): qty 2

## 2016-06-01 MED ORDER — FENTANYL CITRATE (PF) 100 MCG/2ML IJ SOLN: 25.0000 ug | INTRAMUSCULAR | Status: DC | PRN

## 2016-06-01 MED ORDER — MORPHINE SULFATE (PF) 2 MG/ML IV SOLN: 2.0000 mg | INTRAVENOUS | Status: DC | PRN

## 2016-06-01 MED ORDER — LACTATED RINGERS IV SOLN
INTRAVENOUS | Status: DC
  Administered 2016-06-01: 09:00:00 via INTRAVENOUS

## 2016-06-01 MED ORDER — PROMETHAZINE HCL 25 MG/ML IJ SOLN: 6.2500 mg | INTRAMUSCULAR | Status: DC | PRN

## 2016-06-01 MED ORDER — VANCOMYCIN HCL IN DEXTROSE 1-5 GM/200ML-% IV SOLN
1000.0000 mg | Freq: Two times a day (BID) | INTRAVENOUS | Status: AC
  Administered 2016-06-01: 1000 mg via INTRAVENOUS
  Filled 2016-06-01 (×2): qty 200

## 2016-06-01 MED ORDER — SUFENTANIL CITRATE 50 MCG/ML IV SOLN
INTRAVENOUS | Status: AC
  Filled 2016-06-01: qty 1

## 2016-06-01 MED ORDER — METHOCARBAMOL 1000 MG/10ML IJ SOLN
500.0000 mg | Freq: Four times a day (QID) | INTRAMUSCULAR | Status: DC | PRN
  Filled 2016-06-01: qty 5

## 2016-06-01 MED ORDER — PROPOFOL 10 MG/ML IV BOLUS
INTRAVENOUS | Status: DC | PRN
  Administered 2016-06-01: 120 mg via INTRAVENOUS

## 2016-06-01 MED ORDER — KETOROLAC TROMETHAMINE 15 MG/ML IJ SOLN
15.0000 mg | Freq: Four times a day (QID) | INTRAMUSCULAR | Status: DC
  Administered 2016-06-01 (×2): 15 mg via INTRAVENOUS
  Filled 2016-06-01 (×2): qty 1

## 2016-06-01 MED ORDER — LIDOCAINE 2% (20 MG/ML) 5 ML SYRINGE
INTRAMUSCULAR | Status: DC | PRN
  Administered 2016-06-01: 100 mg via INTRAVENOUS

## 2016-06-01 MED ORDER — KETOROLAC TROMETHAMINE 15 MG/ML IJ SOLN
INTRAMUSCULAR | Status: AC
  Filled 2016-06-01: qty 1

## 2016-06-01 MED ORDER — VANCOMYCIN HCL IN DEXTROSE 1-5 GM/200ML-% IV SOLN
1000.0000 mg | Freq: Two times a day (BID) | INTRAVENOUS | Status: DC
  Administered 2016-06-02 – 2016-06-04 (×6): 1000 mg via INTRAVENOUS
  Filled 2016-06-01 (×6): qty 200

## 2016-06-01 MED ORDER — FUROSEMIDE 10 MG/ML IJ SOLN
20.0000 mg | Freq: Once | INTRAMUSCULAR | Status: AC
  Administered 2016-06-01: 20 mg via INTRAVENOUS
  Filled 2016-06-01: qty 2

## 2016-06-01 MED ORDER — METOCLOPRAMIDE HCL 5 MG PO TABS: 5.0000 mg | ORAL_TABLET | Freq: Three times a day (TID) | ORAL | Status: DC | PRN

## 2016-06-01 MED ORDER — HYDROCODONE-ACETAMINOPHEN 7.5-325 MG PO TABS: 1.0000 | ORAL_TABLET | Freq: Four times a day (QID) | ORAL | Status: DC

## 2016-06-01 MED ORDER — HYDROMORPHONE HCL 2 MG/ML IJ SOLN
0.5000 mg | INTRAMUSCULAR | Status: DC | PRN
  Administered 2016-06-01: 1 mg via INTRAVENOUS
  Filled 2016-06-01: qty 1

## 2016-06-01 MED ORDER — DIPHENHYDRAMINE HCL 25 MG PO CAPS
25.0000 mg | ORAL_CAPSULE | Freq: Once | ORAL | Status: AC
  Administered 2016-06-01: 25 mg via ORAL
  Filled 2016-06-01: qty 1

## 2016-06-01 MED ORDER — ONDANSETRON HCL 4 MG/2ML IJ SOLN
INTRAMUSCULAR | Status: AC
  Filled 2016-06-01: qty 2

## 2016-06-01 MED ORDER — MIDAZOLAM HCL 5 MG/5ML IJ SOLN
INTRAMUSCULAR | Status: DC | PRN
  Administered 2016-06-01: 2 mg via INTRAVENOUS

## 2016-06-01 MED ORDER — SODIUM CHLORIDE 0.9 % IV SOLN: INTRAVENOUS | Status: DC

## 2016-06-01 SURGICAL SUPPLY — 50 items
BANDAGE ELASTIC 4 VELCRO ST LF (GAUZE/BANDAGES/DRESSINGS) ×3 IMPLANT
BLADE CUDA 5.5 (BLADE) IMPLANT
BLADE GREAT WHITE 4.2 (BLADE) IMPLANT
BLADE GREAT WHITE 4.2MM (BLADE)
BNDG COHESIVE 6X5 TAN STRL LF (GAUZE/BANDAGES/DRESSINGS) ×3 IMPLANT
BUR OVAL 6.0 (BURR) IMPLANT
COVER SURGICAL LIGHT HANDLE (MISCELLANEOUS) ×3 IMPLANT
CUFF TOURNIQUET SINGLE 34IN LL (TOURNIQUET CUFF) IMPLANT
CUFF TOURNIQUET SINGLE 44IN (TOURNIQUET CUFF) IMPLANT
DRAPE ARTHROSCOPY W/POUCH 114 (DRAPES) ×3 IMPLANT
DRAPE U-SHAPE 47X51 STRL (DRAPES) ×3 IMPLANT
DRSG ADAPTIC 3X8 NADH LF (GAUZE/BANDAGES/DRESSINGS) ×3 IMPLANT
DRSG EMULSION OIL 3X3 NADH (GAUZE/BANDAGES/DRESSINGS) ×3 IMPLANT
DRSG MEPILEX BORDER 4X8 (GAUZE/BANDAGES/DRESSINGS) ×3 IMPLANT
DRSG PAD ABDOMINAL 8X10 ST (GAUZE/BANDAGES/DRESSINGS) ×3 IMPLANT
DURAPREP 26ML APPLICATOR (WOUND CARE) ×6 IMPLANT
ELECT PENCIL ROCKER SW 15FT (MISCELLANEOUS) ×3 IMPLANT
GAUZE SPONGE 4X4 12PLY STRL (GAUZE/BANDAGES/DRESSINGS) ×3 IMPLANT
GLOVE BIO SURGEON STRL SZ 6.5 (GLOVE) ×2 IMPLANT
GLOVE BIO SURGEONS STRL SZ 6.5 (GLOVE) ×1
GLOVE BIOGEL PI IND STRL 6.5 (GLOVE) ×1 IMPLANT
GLOVE BIOGEL PI IND STRL 8.5 (GLOVE) ×1 IMPLANT
GLOVE BIOGEL PI INDICATOR 6.5 (GLOVE) ×2
GLOVE BIOGEL PI INDICATOR 8.5 (GLOVE) ×2
GLOVE SS BIOGEL STRL SZ 8.5 (GLOVE) ×1 IMPLANT
GLOVE SUPERSENSE BIOGEL SZ 8.5 (GLOVE) ×2
GOWN STRL REUS W/ TWL LRG LVL3 (GOWN DISPOSABLE) ×1 IMPLANT
GOWN STRL REUS W/TWL 2XL LVL3 (GOWN DISPOSABLE) ×6 IMPLANT
GOWN STRL REUS W/TWL LRG LVL3 (GOWN DISPOSABLE) ×2
KIT BASIN OR (CUSTOM PROCEDURE TRAY) ×3 IMPLANT
KIT ROOM TURNOVER OR (KITS) ×3 IMPLANT
MANIFOLD NEPTUNE II (INSTRUMENTS) ×3 IMPLANT
NEEDLE 18GX1X1/2 (RX/OR ONLY) (NEEDLE) ×3 IMPLANT
PACK ARTHROSCOPY DSU (CUSTOM PROCEDURE TRAY) ×3 IMPLANT
PAD ARMBOARD 7.5X6 YLW CONV (MISCELLANEOUS) ×6 IMPLANT
PADDING CAST COTTON 6X4 STRL (CAST SUPPLIES) ×3 IMPLANT
PIN SAFETY STERILE (MISCELLANEOUS) ×3 IMPLANT
SET ARTHROSCOPY TUBING (MISCELLANEOUS) ×2
SET ARTHROSCOPY TUBING LN (MISCELLANEOUS) ×1 IMPLANT
SURGIFLO W/THROMBIN 8M KIT (HEMOSTASIS) IMPLANT
SUT BONE WAX W31G (SUTURE) ×3 IMPLANT
SUT ETHILON 2 0 FS 18 (SUTURE) ×3 IMPLANT
SUT ETHILON 4 0 PS 2 18 (SUTURE) ×3 IMPLANT
SUT VIC AB 2-0 CT1 18 (SUTURE) ×3 IMPLANT
SYR 20CC LL (SYRINGE) ×3 IMPLANT
TOWEL OR 17X24 6PK STRL BLUE (TOWEL DISPOSABLE) ×6 IMPLANT
TUBE CONNECTING 12'X1/4 (SUCTIONS) ×1
TUBE CONNECTING 12X1/4 (SUCTIONS) ×2 IMPLANT
WAND HAND CNTRL MULTIVAC 90 (MISCELLANEOUS) IMPLANT
WATER STERILE IRR 1000ML POUR (IV SOLUTION) ×3 IMPLANT

## 2016-06-01 NOTE — Anesthesia Procedure Notes (Signed)
Procedure Name: Intubation Date/Time: 06/01/2016 6:10 AM Performed by: Garrison [place] T Pre-anesthesia Checklist: Patient identified, Emergency Drugs available, Suction available and Patient being monitored Patient Re-evaluated:Patient Re-evaluated prior to inductionOxygen Delivery Method: Circle System Utilized Preoxygenation: Pre-oxygenation with 100% oxygen Intubation Type: IV induction Ventilation: Mask ventilation without difficulty Laryngoscope Size: Mac and 3 Grade View: Grade I Tube type: Oral Tube size: 8.0 mm Number of attempts: 1 Airway Equipment and Method: Stylet and Oral airway Placement Confirmation: ETT inserted through vocal cords under direct vision,  positive ETCO2 and breath sounds checked- equal and bilateral Secured at: 22 cm Tube secured with: Tape Dental Injury: Teeth and Oropharynx as per pre-operative assessment  Comments: Intubation by Valda Lamb, CRNA

## 2016-06-01 NOTE — Care Management Note (Signed)
Case Management Note  Patient Details  Name: Angie Wright MRN: [id number] Date of Birth: [birth year]  Subjective/Objective:                    Action/Plan:  Discussed home health care with patient and family at bedside.  Confirmed face sheet information. Explained HHRN will teach patient and family how to administer IV ABX at home , because [facility] will not be there everytime a dose is due. Patient and family voiced understanding.  Once  It is determined which antibiotic will be prescribed at home , will need [facility] order and face to face and prescription .   Thanks Magdalen Spatz RN [phone]  Expected Discharge Date:                  Expected Discharge Plan:  [place]  In-House Referral:     Discharge planning Services  CM Consult  Post Acute Care Choice:  Home Health Choice offered to:  Patient  DME Arranged:    DME Agency:     HH Arranged:  RN [place] Agency:  [facility]  Status of Service:  In process, will continue to follow  If discussed at Long Length of Stay Meetings, dates discussed:    Additional Comments:  Marilu Favre, RN 06/01/2016, 10:17 AM

## 2016-06-01 NOTE — Progress Notes (Signed)
15 min blood vitals obtained. Pt laying SF in bed. Pt repts that she "feels better in bed". Pt lethargic and tired but very arousable from sitting in chair and transfer back to bed. BP 138/47 P 93 100% RA Temp 101.2 Ax. Blood stopped. MD paged. Pt received Tylenol PO per MD order at 1707. Rept to Rapid Response RN. Continuing to monitor.

## 2016-06-01 NOTE — Progress Notes (Signed)
Physical Therapy Evaluation Patient Details Name: Angie Wright MRN: [id number] DOB: 11/30/1955 Today's Date: 06/01/2016   History of Present Illness  Pt is a 60 y/o female who had 24-48 hr history of progressive knee pain, swelling, difficulty weight bearing and moving the knee. She ultimately came to the emergency room and was diagnosed with a septic knee. She has felt intermittently nauseated. Pt with PMH of anemia. Pt has sickle cell trait and notes that her HgB tends to stay at around 7-8  Clinical Impression  PTA, pt was independent with all ADLs and community mobility and worked full time at a desk job. Pt currently lives with husband who will be available 24/7 as needed. Pt's daughter will also be available prn. Pt able to perform bed mobility and stand pivot with +2 mod assist today. Pt with significant posterior lean upon standing which was corrected with increased time and verbal cues to upright trunk. Pt fatigued after pivot to chair and required immediate sit down. Pt c/o dizziness/nausea with mobility. Vitals stable throughout session. Pt will benefit from HHPT to address deficits in strength and mobility before d/c to next venue. PT will continue to follow acutely.    Follow Up Recommendations Home health PT;Supervision/Assistance - 24 hour    Equipment Recommendations  Rolling walker with 5" wheels;3in1 (PT)    Recommendations for Other Services       Precautions / Restrictions Precautions Precautions: Fall Restrictions Weight Bearing Restrictions: Yes LLE Weight Bearing: Weight bearing as tolerated      Mobility  Bed Mobility Overal bed mobility: Needs Assistance Bed Mobility: Supine to Sit     Supine to sit: Mod assist;+2 for physical assistance     General bed mobility comments: Pt required assist +2 for B LE mobility and to scoot pelvis towards EOB. Pt dizzy upon sitting EOB.   Transfers Overall transfer level: Needs assistance Equipment used: Rolling  walker (2 wheeled) Transfers: Stand Pivot Transfers   Stand pivot transfers: Mod assist;+2 physical assistance       General transfer comment: Pt required mod assist +2 to power to stand. Difficulty standing to upright posture. Verbal cues to correct posterior lean and hand placement. Once standing, pt able to maintain upright posture and pivot towards chair. Pt reported dizziness and nausea during/after transfer. Vitals stable throughout  Ambulation/Gait                Stairs            Wheelchair Mobility    Modified Rankin (Stroke Patients Only)       Balance Overall balance assessment: Needs assistance Sitting-balance support: Bilateral upper extremity supported;Feet supported Sitting balance-Leahy Scale: Poor   Postural control: Posterior lean Standing balance support: Bilateral upper extremity supported;During functional activity Standing balance-Leahy Scale: Poor Standing balance comment: Pt reliant on BUE support to maintain static standing balance                             Pertinent Vitals/Pain Pain Assessment: 0-10 Pain Score: 5  Pain Location: B feet Pain Descriptors / Indicators: Aching;Pressure;Sharp Pain Intervention(s): Limited activity within patient's tolerance;Monitored during session;RN gave pain meds during session;Repositioned  At EOB: BP 126/55. SpO2 100% HR 92 In Chair: BP 123/68. SpO2 97% HR 88  Home Living Family/patient expects to be discharged to:: Private residence Living Arrangements: Spouse/significant other Available Help at Discharge: Family (husband and daughter) Type of Home: House Home Access: Stairs to  enter Entrance Stairs-Rails: Right Entrance Stairs-Number of Steps: 3 Home Layout: Multi-level Home Equipment: Cane - single point      Prior Function Level of Independence: Independent               Hand Dominance   Dominant Hand: Right    Extremity/Trunk Assessment   Upper Extremity  Assessment Upper Extremity Assessment: Overall WFL for tasks assessed    Lower Extremity Assessment Lower Extremity Assessment: Generalized weakness;LLE deficits/detail;RLE deficits/detail RLE Deficits / Details: painful to touch. painful with WB. LLE Deficits / Details: limited strength and ROM as expected post-operatively. Painful with weightbearing       Communication   Communication: No difficulties  Cognition Arousal/Alertness: Awake/alert Behavior During Therapy: WFL for tasks assessed/performed Overall Cognitive Status: Within Functional Limits for tasks assessed                      General Comments General comments (skin integrity, edema, etc.): Vitals stable throughout session. R foot swollen and painful as noted upon assisting pt to lift RLE OOB and while pt was weight bearing on RLE     Exercises General Exercises - Lower Extremity Ankle Circles/Pumps: AROM;10 reps;Both;Supine Quad Sets: Left;5 reps;Seated;AROM Heel Slides: AROM;5 reps;Left;Seated   Assessment/Plan    PT Assessment Patient needs continued PT services  PT Problem List Decreased strength;Decreased range of motion;Decreased activity tolerance;Decreased balance;Decreased mobility;Decreased knowledge of use of DME;Decreased safety awareness;Decreased knowledge of precautions;Pain          PT Treatment Interventions DME instruction;Gait training;Stair training;Therapeutic activities;Therapeutic exercise;Balance training;Patient/family education;Functional mobility training    PT Goals (Current goals can be found in the Care Plan section)  Acute Rehab PT Goals Patient Stated Goal: to be able to walk PT Goal Formulation: With patient Time For Goal Achievement: 06/15/16 Potential to Achieve Goals: Good    Frequency Min 5X/week   Barriers to discharge        Co-evaluation               End of Session Equipment Utilized During Treatment: Gait belt Activity Tolerance: Patient  limited by fatigue;Patient limited by pain Patient left: in chair;with call bell/phone within reach;with family/visitor present;with nursing/sitter in room Nurse Communication: Mobility status         Time: 1128-1202 PT Time Calculation (min) (ACUTE ONLY): 34 min   Charges:   PT Evaluation $PT Eval Moderate Complexity: 1 Procedure PT Treatments $Therapeutic Activity: 8-22 mins   PT G Codes:        Tonia Brooms 2016/06/09, 12:35 PM Tonia Brooms, SPT [phone]

## 2016-06-01 NOTE — Transfer of Care (Signed)
Immediate Anesthesia Transfer of Care Note  Patient: Angie Wright  Procedure(s) Performed: Procedure(s): ARTHROSCOPIC WASHOUT LEFT KNEE (Left)  Patient Location: PACU  Anesthesia Type:General  Level of Consciousness: awake and alert   Airway & Oxygen Therapy: Patient Spontanous Breathing  Post-op Assessment: Report given to RN, Post -op Vital signs reviewed and stable and Patient moving all extremities X 4  Post vital signs: Reviewed and stable  Last Vitals:  Vitals:   06/01/16 0442 06/01/16 0705  BP: (!) 122/51 137/65  Pulse: (!) 103   Resp: 18   Temp: 36.9 C 36.5 C    Last Pain:  Vitals:   06/01/16 0442  TempSrc: Oral  PainSc:          Complications: No apparent anesthesia complications

## 2016-06-01 NOTE — Progress Notes (Signed)
Rept to Dr. Broadus John regarding pt back from surgery. Clarification with MD regarding labs that were not drawn at [facility]. (ie bld cultures, lactic acid). Per Dr. Delene Loll order will obtain bld cultures but will d/c order for lactic acid at this time. Rept to Dr. Broadus John that pt's hgb this AM was 6.2. Rept to MD that per pt, her baseline hgb is 7-8. Orders received for 2 units PRBC. Rept to MD earlier that pt's WBC has been 34. Rept to MD that pt is having nausea and abdominal tenderness. Will continue to monitor.

## 2016-06-01 NOTE — Progress Notes (Addendum)
First unit of blood completed. Pt tolerated first unit of blood without difficulty. VS obtained. Pt BP 133/59 P 97 100% RA. Resps 16. Temp Axillary 99.1 Pt repts feeling very "achy" all over. Will give Tylenol. Pt is sitting in recliner and repts being "very uncomfortable" in chair. Pt nauseated and vomiting has waxed and waned throughout the day. Will assist pt back to bed after starting 2nd unit of blood. Will administer IV Lasix per MD order prior to starting 2nd unit of blood. Will continue to monitor.

## 2016-06-01 NOTE — Op Note (Signed)
NAMEEUDELIA, Angie Wright NO.:  [id number]  MEDICAL RECORD NO.:  [id number]  LOCATION:  WA01                         FACILITY:  [facility]  PHYSICIAN:  Nadia Viar D. Rolena Infante, M.D. DATE OF BIRTH:  07-18-1955  DATE OF PROCEDURE:  06/01/2016 DATE OF DISCHARGE:                              OPERATIVE REPORT   PREOPERATIVE DIAGNOSIS:  Septic left knee.  POSTOPERATIVE DIAGNOSIS:  Septic left knee.  OPERATIVE PROCEDURE:  Arthroscopic I and D of left knee.  COMPLICATIONS:  None.  CONDITION:  Stable.  HISTORY:  This is a pleasant 60 year old woman who had a 24-48 hour history of progressive knee pain, swelling, difficulty weightbearing and moving the knee.  She ultimately came to the emergency room and was diagnosed with a septic knee after aspirate revealed purulent material, Gram stain demonstrated gram-positive cocci with positive white blood cells.  As a result, I elected to take her to the operating room for a formal I and D of the knee.  All appropriate risks, benefits, and alternatives were discussed with the patient and her husband and consent was obtained.  OPERATIVE NOTE:  The patient was brought to the operating room and placed supine on the operating room table.  After successful induction of general anesthesia and endotracheal intubation, the left lower extremity which was marked in the holding area was prepped and draped in a standard fashion.  Time-out was taken confirming patient, procedure, and all other pertinent data was confirmed.  Once this was completed, I then made a small superior lateral incision with an 11 blade scalpel and placed my inflow cannula.  I then flexed the knee and placed the inferior medial incision and established my outflow portal.  At this point, once both were inside the knee, I noted immediate further purulent drainage, which I sent for repeat Gram stain and cultures.  I then irrigated with 9 L of fluid through the knee.  At the  conclusion of the case, the fluid coming out was clear.  There was no further purulent material and the swelling had decreased.  A drain was then placed through the superior lateral trocar.  A bulky dressing was applied as was an Ace wrap and the patient was ultimately extubated, transferred to the PACU without incident.  At the end of the case, all needle and sponge counts were correct.  There were no adverse intraoperative events.     Ronaldo Crilly D. Rolena Infante, M.D.    DDB/MEDQ  D:  06/01/2016  T:  06/01/2016  Job:  [id number]

## 2016-06-01 NOTE — ED Notes (Signed)
Main lab called to draw labs and blood cultures.

## 2016-06-01 NOTE — Progress Notes (Signed)
Rept to Dr. Broadus John. Pt's vital signs after Benadryl and Tylenol- 98% RA, P 91, Resp 16, 138/53 T 99.5 oral. Pt is lethargic but arousable. Family supportive and at bedside. Per MD order, will restart blood. Continuing to monitor.

## 2016-06-01 NOTE — ED Notes (Signed)
RN unsuccessful at lab collection.

## 2016-06-01 NOTE — Progress Notes (Signed)
Pharmacy Antibiotic Note  Angie Wright is a 60 y.o. female admitted on 05/31/2016 with fever, body aches, L-knee pain/swelling concerning for septic arthritis. Pharmacy was originally consulted for Vanc + Rocephin dosing however these were discontinued in preparation for I&D by ortho - done this morning. ID has also been consulted.  Pharmacy has been consulted to resume Vancomycin dosing.  The patient has received Vancomycin 1g at 0311 and 1030 today along with Rocephin 2g at 0146. Given the double doses of Vancomycin this morning - will delay the evening dose slightly to off-set this.   Plan: 1. Resume Vancomycin 1g IV every 12 hours (starting on 12/29 at 0200) 2. Continue Rocephin 2g IV every 24 hours per MD.  3. Will continue to follow renal function, culture results, LOT, and antibiotic de-escalation plans   Height: 5\' 6"  (167.6 cm) Weight: 172 lb (78 kg) IBW/kg (Calculated) : 59.3  Temp (24hrs), Avg:98.9 F (37.2 C), Min:97.7 F (36.5 C), Max:101.5 F (38.6 C)   Recent Labs Lab 05/31/16 1926 06/01/16 0829 06/01/16 0900  WBC 34.1* 34.7*  --   CREATININE 0.67 0.68 0.63    Estimated Creatinine Clearance: 77.9 mL/min (by C-G formula based on SCr of 0.63 mg/dL).    No Known Allergies  Antimicrobials this admission: CTX 12/28 >> Vanc 12/28 >>  Dose adjustments this admission: N/a   Microbiology results: 12/27 L-knee aspiration >> 12/28 MRSA PCR >> neg 12/28 BCx >> 12/28 L-knee abscess >> GS showing GPC in pairs + GNR  Thank you for allowing pharmacy to be a part of this patient's care.  Alycia Rossetti, PharmD, BCPS Clinical Pharmacist Pager: [phone] 06/01/2016 4:08 PM

## 2016-06-01 NOTE — Progress Notes (Addendum)
Rept to Dr. Broadus John that pt's R foot is swollen and painful. No new orders at this time. Will continue to monitor.

## 2016-06-01 NOTE — Progress Notes (Signed)
Rept to Dr. Broadus John that pt is still experiencing nausea and vomiting even after IV Zofran per order. Pt is very sensitive to medications and becomes very sleepy with meds. Pt has been on continuous pulse ox since admission to unit from surgery. Pt sats 98 to 100% on RA. Pt is sleepy but lethargic. Per MD order, will wait to administer another dose of Zofran IV when it is time. Per her order, will hold off on further antiemetics due to the patient's sensitivity to meds. Dr. Broadus John has reviewed pt's abdominal ultrasound rept. Will continue to monitor.

## 2016-06-01 NOTE — Progress Notes (Signed)
Pharmacy Antibiotic Note  Angie Wright is a 59 y.o. female c/o fever admitted on 05/31/2016 with sepsis/septic arthritis.  Pharmacy has been consulted for rocephin and vancomycin dosing.  Plan: Rocephin 2Gm IV q24h Vancomycin 1Gm IV q12h  VT=15-20 mg/L  Height: 5\' 6"  (167.6 cm) Weight: 172 lb (78 kg) IBW/kg (Calculated) : 59.3  Temp (24hrs), Avg:100.4 F (38 C), Min:99.2 F (37.3 C), Max:101.5 F (38.6 C)   Recent Labs Lab 05/31/16 1926  WBC 34.1*  CREATININE 0.67    Estimated Creatinine Clearance: 77.9 mL/min (by C-G formula based on SCr of 0.67 mg/dL).    No Known Allergies  Antimicrobials this admission: 12/28 rocephin >>  12/28 vancomycin >>   Dose adjustments this admission:   Microbiology results:  BCx:   UCx:    Sputum:    MRSA PCR:   Thank you for allowing pharmacy to be a part of this patient's care.  Dorrene German 06/01/2016 1:18 AM

## 2016-06-01 NOTE — Progress Notes (Signed)
Patient lying in bed, she states that she feels very weak and wiped out. She is oriented but she is tired.  Currently her temp is 99.3,  BP 140/59  HR 99 RR 16 O2 sat 99% on RA.  She looks very weak and "sickly".  Lung sounds decreased in bases, heart tones regular.   2nd unit PRBC infusing post medication.  Will continue to monitor patient, night RR RN at bedside to assess patient as well.  Rn to call if patient worsens.

## 2016-06-01 NOTE — Anesthesia Postprocedure Evaluation (Signed)
Anesthesia Post Note  Patient: Angie Wright  Procedure(s) Performed: Procedure(s) (LRB): ARTHROSCOPIC WASHOUT LEFT KNEE (Left)  Patient location during evaluation: PACU Anesthesia Type: General Level of consciousness: awake and alert Pain management: pain level controlled Vital Signs Assessment: post-procedure vital signs reviewed and stable Respiratory status: spontaneous breathing, nonlabored ventilation, respiratory function stable and patient connected to nasal cannula oxygen Cardiovascular status: blood pressure returned to baseline and stable Postop Assessment: no signs of nausea or vomiting Anesthetic complications: no       Last Vitals:  Vitals:   06/01/16 1345 06/01/16 1656  BP: (!) 117/44 (!) 133/59  Pulse: 85 97  Resp: 16 13  Temp: 36.9 C 37.2 C    Last Pain:  Vitals:   06/01/16 1656  TempSrc: Axillary  PainSc:                  Tiajuana Amass

## 2016-06-01 NOTE — Consult Note (Signed)
No PCP Per Patient Chief Complaint: Left knee pain x 48 hrs History:  Angie Wright is a 60 y.o. female with medical history significant of Anemia (baseline HGB is "between 7 and 8 always at the doctors office" she says), this due to "sickle cell trait" she states.  Patient presents to the ED with c/o fever, bodyaches, L knee pain and swelling.  Symptoms onset yesterday morning with vomiting.  Intermittent nausea since then.  Fever 101.5 here in ED but no fever at home.  Past Medical History:  Diagnosis Date  . Anemia    states "sickle cell trait, HGB always runs between 7 and 8"    No Known Allergies  No current facility-administered medications on file prior to encounter.    No current outpatient prescriptions on file prior to encounter.    Physical Exam: Vitals:   06/01/16 0300 06/01/16 0442  BP: 154/76 (!) 122/51  Pulse: 114 (!) 103  Resp: 18 18  Temp:  98.5 F (36.9 C)  A+O X3 Compartments soft/NT EHL/GA/TA intact 2+ DP/PT pulses Left knee: swollen, tender.  Unable to move secondary to extreme pain No hip/ankle pain. Right LE - asymptomatic No SOB/CP Abd soft/NT  Image: Dg Chest 2 View  Result Date: 06/01/2016 CLINICAL DATA:  Acute onset of fever and disorientation. Initial encounter. EXAM: CHEST  2 VIEW COMPARISON:  None. FINDINGS: The lungs are well-aerated. Vascular congestion is noted. Minimal bilateral opacities may reflect atelectasis or possibly mild infection. There is no evidence of pleural effusion or pneumothorax. The heart is enlarged. No acute osseous abnormalities are seen. Chronic sclerosis is seen at the humeral heads bilaterally. IMPRESSION: Vascular congestion and cardiomegaly noted. Minimal bilateral opacities may reflect atelectasis or possibly mild infection. Electronically Signed   By: Garald Balding M.D.   On: 06/01/2016 00:03   Dg Foot Complete Right  Result Date: 06/01/2016 CLINICAL DATA:  Acute onset of right dorsal foot pain and swelling.  Initial encounter. EXAM: RIGHT FOOT COMPLETE - 3+ VIEW COMPARISON:  None. FINDINGS: There is no evidence of fracture or dislocation. Degenerative change is noted at the medial aspect of the midfoot. There is no evidence of talar subluxation; the subtalar joint is unremarkable in appearance. A plantar calcaneal spur is seen. No significant soft tissue abnormalities are seen. IMPRESSION: 1. No evidence of fracture or dislocation. 2. Degenerative change at the medial aspect of the midfoot. Electronically Signed   By: Garald Balding M.D.   On: 06/01/2016 00:04   US Abdomen Limited Ruq  Result Date: 06/01/2016 CLINICAL DATA:  Acute onset of fever.  Initial encounter. EXAM: US ABDOMEN LIMITED - RIGHT UPPER QUADRANT COMPARISON:  None. FINDINGS: Gallbladder: Multiple layering stones are seen dependently within the gallbladder, measuring up to 9 mm in size. No gallbladder wall thickening or pericholecystic fluid is seen. No ultrasonographic Murphy's sign is elicited. Common bile duct: Diameter: 0.4 cm, within normal limits in caliber. Liver: No focal lesion identified. Within normal limits in parenchymal echogenicity. IMPRESSION: Cholelithiasis. Gallbladder otherwise unremarkable. No evidence for cholecystitis or obstruction. Electronically Signed   By: Garald Balding M.D.   On: 06/01/2016 01:19   Gram stain: gram + cocci in pairs.  WBC present.  A/P: Patient with 48 hrs of progressive left knee swelling and pain. No recent travel, infection, or trauma. Knee aspirated in the ER and noted to be purulent. IV antibiotics given and Ortho called for septic left knee. Agree with diagnosis - plan on I&D this AM  Will also  consult ID team for long term abx management  Also order PICC line for later today. Reviewed risks and benefits with patient and husband - all questions addressed Risks: infection, bleeding, blood clots, on going or worse pain, need for additional surgery, death, stoke, paralysis.

## 2016-06-01 NOTE — Progress Notes (Signed)
Vital signs retaken. BP 140/59, T 99.3 Oral, pulse 99, 99% RA. Pt remains lethargic but arousable. Rapid response in. Rept given to Bartolo Darter. Will continue to monitor.

## 2016-06-01 NOTE — Progress Notes (Signed)
PROGRESS NOTE    Angie Wright  [id number] DOB: [birth year] DOA: 05/31/2016 PCP: No PCP Per Patient  Brief Narrative: Angie Wright is a 60 y.o. female with medical history significant of Anemia (baseline HGB is "between 7 and 8 always at the doctors office" she says), this due to "sickle cell trait" she states.  Patient presents to the ED with c/o fever, bodyaches, L knee pain and swelling  Assessment & Plan:   Principal Problem:   Septic arthritis of knee, left (HCC) -S/p Arthroscopic I and D of left knee.12/28 by Dr.Brooks -continue IV Vanc -Cx with GPC in pairs and rare GNR -FU blood Cx -will consult ID tomorrow    Severe microcytic anemia -chronic, with baseline of 7-8gm/dl -Hb down to 6.2, will transfuse 2units PRBC now -check anemia panel and FU Hb electrophoresis, h/o Mifflin trait  DVT prophylaxis:Lovenox Code Status:Full COde Family Communication:family at bedside Disposition Plan:Home in few days   Consultants:   Ortho   Procedures:Arthroscopic I and D of left knee.12/28  Antimicrobials: Vancomycin 12/27 Subjective: Some L knee pain  Objective: Vitals:   06/01/16 0810 06/01/16 1130 06/01/16 1135 06/01/16 1311  BP: 131/67 (!) 126/55 123/65 (!) 131/46  Pulse: 87 92 88 86  Resp: 19   16  Temp: 98.2 F (36.8 C)   98.6 F (37 C)  TempSrc: Oral   Oral  SpO2: 97% 97% 97% 99%  Weight:      Height:        Intake/Output Summary (Last 24 hours) at 06/01/16 1314 Last data filed at 06/01/16 1311  Gross per 24 hour  Intake             4800 ml  Output               21 ml  Net             4779 ml   Filed Weights   06/01/16 0108  Weight: 78 kg (172 lb)    Examination:  General exam: Appears calm and comfortable, AAOx3 Respiratory system: Clear to auscultation. Respiratory effort normal. Cardiovascular system: S1 & S2 heard, RRR. No JVD, murmurs, rubs, gallops or clicks.  Gastrointestinal system: Abdomen is nondistended, soft and nontender. Normal bowel  sounds heard. Central nervous system: Alert and oriented. No focal neurological deficits. Extremities: L knee with dressing Skin: No rashes, lesions or ulcers Psychiatry: Judgement and insight appear normal. Mood & affect appropriate.     Data Reviewed: I have personally reviewed following labs and imaging studies  CBC:  Recent Labs Lab 05/31/16 1926 06/01/16 0829  WBC 34.1* 34.7*  NEUTROABS 33.1*  --   HGB 7.4* 6.2*  HCT 21.9* 18.4*  MCV 67.8* 68.7*  PLT 256 [id number]   Basic Metabolic Panel:  Recent Labs Lab 05/31/16 1926 06/01/16 0829 06/01/16 0900  NA 142  --  145  K 2.8*  --  3.3*  CL 110  --  116*  CO2 23  --  24  GLUCOSE 186*  --  154*  BUN 15  --  12  CREATININE 0.67 0.68 0.63  CALCIUM 8.9  --  8.3*  MG 2.1  --   --    GFR: Estimated Creatinine Clearance: 77.9 mL/min (by C-G formula based on SCr of 0.63 mg/dL). Liver Function Tests:  Recent Labs Lab 05/31/16 1926  AST 39  ALT 27  ALKPHOS 78  BILITOT 4.9*  PROT 7.4  ALBUMIN 3.9    Recent Labs Lab 05/31/16  1926  LIPASE 19   No results for input(s): AMMONIA in the last 168 hours. Coagulation Profile: No results for input(s): INR, PROTIME in the last 168 hours. Cardiac Enzymes:  Recent Labs Lab 05/31/16 1926  CKTOTAL 112   BNP (last 3 results) No results for input(s): PROBNP in the last 8760 hours. HbA1C: No results for input(s): HGBA1C in the last 72 hours. CBG: No results for input(s): GLUCAP in the last 168 hours. Lipid Profile: No results for input(s): CHOL, HDL, LDLCALC, TRIG, CHOLHDL, LDLDIRECT in the last 72 hours. Thyroid Function Tests: No results for input(s): TSH, T4TOTAL, FREET4, T3FREE, THYROIDAB in the last 72 hours. Anemia Panel:  Recent Labs  06/01/16 0900 06/01/16 0930  FOLATE  --  15.8  RETICCTPCT 4.8*  --    Urine analysis:    Component Value Date/Time   COLORURINE YELLOW 06/01/2016 0257   APPEARANCEUR HAZY (A) 06/01/2016 0257   LABSPEC 1.012 06/01/2016  0257   PHURINE 5.0 06/01/2016 0257   GLUCOSEU 50 (A) 06/01/2016 0257   HGBUR MODERATE (A) 06/01/2016 0257   BILIRUBINUR NEGATIVE 06/01/2016 0257   KETONESUR NEGATIVE 06/01/2016 0257   PROTEINUR >=300 (A) 06/01/2016 0257   NITRITE NEGATIVE 06/01/2016 0257   LEUKOCYTESUR MODERATE (A) 06/01/2016 0257   Sepsis Labs: @LABRCNTIP (procalcitonin:4,lacticidven:4)  ) Recent Results (from the past 240 hour(s))  Body fluid culture     Status: None (Preliminary result)   Collection Time: 05/31/16 12:43 AM  Result Value Ref Range Status   Specimen Description KNEE LEFT  Final   Special Requests NONE  Final   Gram Stain   Final    WBC PRESENT, PREDOMINANTLY PMN GRAM POSITIVE COCCI IN PAIRS CYTOSPIN SMEAR Gram Stain Report Called to,Read Back By and Verified With: L.ADKINS AT 0201 ON 06/01/16 BY W.SHEA    Culture PENDING  Incomplete   Report Status PENDING  Incomplete  Surgical pcr screen     Status: None   Collection Time: 06/01/16  5:16 AM  Result Value Ref Range Status   MRSA, PCR NEGATIVE NEGATIVE Final   Staphylococcus aureus NEGATIVE NEGATIVE Final    Comment:        The Xpert SA Assay (FDA approved for NASAL specimens in patients over 38 years of age), is one component of a comprehensive surveillance program.  Test performance has been validated by [facility] for patients greater than or equal to 84 year old. It is not intended to diagnose infection nor to guide or monitor treatment.   Aerobic/Anaerobic Culture (surgical/deep wound)     Status: None (Preliminary result)   Collection Time: 06/01/16  7:06 AM  Result Value Ref Range Status   Specimen Description ABSCESS LEFT KNEE  Final   Special Requests NONE  Final   Gram Stain   Final    ABUNDANT WBC PRESENT, PREDOMINANTLY PMN FEW GRAM POSITIVE COCCI IN PAIRS RARE GRAM NEGATIVE RODS    Culture PENDING  Incomplete   Report Status PENDING  Incomplete         Radiology Studies: Dg Chest 2 View  Result Date:  06/01/2016 CLINICAL DATA:  Acute onset of fever and disorientation. Initial encounter. EXAM: CHEST  2 VIEW COMPARISON:  None. FINDINGS: The lungs are well-aerated. Vascular congestion is noted. Minimal bilateral opacities may reflect atelectasis or possibly mild infection. There is no evidence of pleural effusion or pneumothorax. The heart is enlarged. No acute osseous abnormalities are seen. Chronic sclerosis is seen at the humeral heads bilaterally. IMPRESSION: Vascular congestion and cardiomegaly  noted. Minimal bilateral opacities may reflect atelectasis or possibly mild infection. Electronically Signed   By: Garald Balding M.D.   On: 06/01/2016 00:03   Dg Knee Left Port  Result Date: 06/01/2016 CLINICAL DATA:  Patient status post debridement of the left knee with drain placement. EXAM: PORTABLE LEFT KNEE - 1-2 VIEW COMPARISON:  None. FINDINGS: Drainage catheter projects over the anterior knee. Tricompartmental osteoarthritis. Normal anatomic alignment. No evidence for acute fracture. IMPRESSION: Drain projects over the anterior knee soft tissues. Tricompartmental osteoarthritis. Electronically Signed   By: Lovey Newcomer M.D.   On: 06/01/2016 10:05   Dg Foot Complete Right  Result Date: 06/01/2016 CLINICAL DATA:  Acute onset of right dorsal foot pain and swelling. Initial encounter. EXAM: RIGHT FOOT COMPLETE - 3+ VIEW COMPARISON:  None. FINDINGS: There is no evidence of fracture or dislocation. Degenerative change is noted at the medial aspect of the midfoot. There is no evidence of talar subluxation; the subtalar joint is unremarkable in appearance. A plantar calcaneal spur is seen. No significant soft tissue abnormalities are seen. IMPRESSION: 1. No evidence of fracture or dislocation. 2. Degenerative change at the medial aspect of the midfoot. Electronically Signed   By: Garald Balding M.D.   On: 06/01/2016 00:04   US Abdomen Limited Ruq  Result Date: 06/01/2016 CLINICAL DATA:  Acute onset of  fever.  Initial encounter. EXAM: US ABDOMEN LIMITED - RIGHT UPPER QUADRANT COMPARISON:  None. FINDINGS: Gallbladder: Multiple layering stones are seen dependently within the gallbladder, measuring up to 9 mm in size. No gallbladder wall thickening or pericholecystic fluid is seen. No ultrasonographic Murphy's sign is elicited. Common bile duct: Diameter: 0.4 cm, within normal limits in caliber. Liver: No focal lesion identified. Within normal limits in parenchymal echogenicity. IMPRESSION: Cholelithiasis. Gallbladder otherwise unremarkable. No evidence for cholecystitis or obstruction. Electronically Signed   By: Garald Balding M.D.   On: 06/01/2016 01:19        Scheduled Meds: . enoxaparin (LOVENOX) injection  40 mg Subcutaneous Daily  . ferrous sulfate  325 mg Oral TID PC  . ketorolac  15 mg Intravenous Q6H  . ketorolac       Continuous Infusions: . lactated ringers 85 mL/hr at 06/01/16 0830     LOS: 0 days    Time spent: 36min    Domenic Polite, MD [facility] Pager [phone]  If 7PM-7AM, please contact night-coverage [email or web address] Password [email or web address] 06/01/2016, 1:14 PM

## 2016-06-01 NOTE — Anesthesia Preprocedure Evaluation (Signed)
Anesthesia Evaluation  Patient identified by MRN, date of birth, ID band Patient awake    Reviewed: Allergy & Precautions, NPO status , Patient's Chart, lab work & pertinent test results  Airway Mallampati: III  TM Distance: >3 FB Neck ROM: Full    Dental  (+) Dental Advisory Given   Pulmonary neg pulmonary ROS,    breath sounds clear to auscultation       Cardiovascular negative cardio ROS   Rhythm:Regular Rate:Normal     Neuro/Psych negative neurological ROS     GI/Hepatic negative GI ROS, Neg liver ROS,   Endo/Other  negative endocrine ROS  Renal/GU negative Renal ROS     Musculoskeletal  (+) Arthritis ,   Abdominal   Peds  Hematology  (+) Sickle cell trait and anemia ,   Anesthesia Other Findings   Reproductive/Obstetrics                             Lab Results  Component Value Date   WBC 34.1 (H) 05/31/2016   HGB 7.4 (L) 05/31/2016   HCT 21.9 (L) 05/31/2016   MCV 67.8 (L) 05/31/2016   PLT 256 05/31/2016   Lab Results  Component Value Date   CREATININE 0.67 05/31/2016   BUN 15 05/31/2016   NA 142 05/31/2016   K 2.8 (L) 05/31/2016   CL 110 05/31/2016   CO2 23 05/31/2016    Anesthesia Physical Anesthesia Plan  ASA: II and emergent  Anesthesia Plan: General   Post-op Pain Management:    Induction: Intravenous  Airway Management Planned: Oral ETT  Additional Equipment:   Intra-op Plan:   Post-operative Plan: Extubation in OR  Informed Consent: I have reviewed the patients History and Physical, chart, labs and discussed the procedure including the risks, benefits and alternatives for the proposed anesthesia with the patient or authorized representative who has indicated his/her understanding and acceptance.   Dental advisory given  Plan Discussed with:   Anesthesia Plan Comments:         Anesthesia Quick Evaluation

## 2016-06-01 NOTE — Consult Note (Signed)
[facility] for Infectious Disease  Date of Admission:  05/31/2016  Date of Consult:  06/01/2016  Reason for Consult: Septic arthritis Referring Physician: Rolena Infante  Impression/Recommendation Septic Arthritis Would place [facility] Would continue vanco/ceftraixone while awaiting aspirate and OR Cx eval her R foot.  Check ESR and CRP Await BCx  Thank you so much for this interesting consult,   Bobby Rumpf (pager) [phone] [email or web address]  MISHELLE HASSAN is an 60 y.o. female.  HPI: 60 yo F with hx of sickle trait, adm today with fever and L knee pain and swelling. She was found to have temp 101.5. Arthrocentesis showed 79k WBC, GPC.  She was taken to OR and had I & D of her knee. Her stain shows GPC, rare GNR.   Past Medical History:  Diagnosis Date  . Anemia    states "sickle cell trait, HGB always runs between 7 and 8"    History reviewed. No pertinent surgical history.   No Known Allergies  Medications:  Scheduled: . enoxaparin (LOVENOX) injection  40 mg Subcutaneous Daily  . ferrous sulfate  325 mg Oral TID PC  . furosemide  20 mg Intravenous Once  . ketorolac        Abtx:  Anti-infectives    Start     Dose/Rate Route Frequency Ordered Stop   06/01/16 2300  cefTRIAXone (ROCEPHIN) 2 g in dextrose 5 % 50 mL IVPB  Status:  Discontinued     2 g 100 mL/hr over 30 Minutes Intravenous Daily at bedtime 06/01/16 0151 06/01/16 0210   06/01/16 0830  vancomycin (VANCOCIN) IVPB 1000 mg/200 mL premix     1,000 mg 200 mL/hr over 60 Minutes Intravenous Every 12 hours 06/01/16 0816 06/01/16 1130   06/01/16 0200  vancomycin (VANCOCIN) IVPB 1000 mg/200 mL premix  Status:  Discontinued     1,000 mg 200 mL/hr over 60 Minutes Intravenous Every 12 hours 06/01/16 0115 06/01/16 0816   06/01/16 0100  cefTRIAXone (ROCEPHIN) 2 g in dextrose 5 % 50 mL IVPB  Status:  Discontinued     2 g 100 mL/hr over 30 Minutes Intravenous NOW 06/01/16 0049 06/01/16 0236       Total days of antibiotics: 0 vanco/ceftriaxone          Social History:  reports that she has never smoked. She has never used smokeless tobacco. She reports that she does not drink alcohol or use drugs.  Family History  Problem Relation Age of Onset  . Sickle cell anemia Brother     2 brothers passed with sickle cell anemia    General ROS: no dysphagia, no cough, no sob, normal BM, normal urine. denies recent injury/wound. see HPI.   Blood pressure (!) 117/44, pulse 85, temperature 98.5 F (36.9 C), temperature source Oral, resp. rate 16, height 5' 6"  (1.676 m), weight 78 kg (172 lb), SpO2 100 %. General appearance: alert, cooperative, fatigued and no distress Eyes: negative findings: conjunctivae and sclerae normal and pupils equal, round, reactive to light and accomodation Throat: normal findings: oropharynx pink & moist without lesions or evidence of thrush Neck: no adenopathy and supple, symmetrical, trachea midline Lungs: clear to auscultation bilaterally Heart: regular rate and rhythm Abdomen: normal findings: bowel sounds normal and soft, non-tender Extremities: R foot wram, tender, mild swelling. 3+ DP pulse. LLE wrapped. there are no open wounds on either foot.    Results for orders placed or performed during the hospital encounter of 05/31/16 (from the past 48 hour(s))  Body fluid culture     Status: None (Preliminary result)   Collection Time: 05/31/16 12:43 AM  Result Value Ref Range   Specimen Description KNEE LEFT    Special Requests NONE    Gram Stain      WBC PRESENT, PREDOMINANTLY PMN GRAM POSITIVE COCCI IN PAIRS CYTOSPIN SMEAR Gram Stain Report Called to,Read Back By and Verified With: L.ADKINS AT 0201 ON 06/01/16 BY W.SHEA    Culture PENDING    Report Status PENDING   Lipase, blood     Status: None   Collection Time: 05/31/16  7:26 PM  Result Value Ref Range   Lipase 19 11 - 51 U/L  Comprehensive metabolic panel     Status: Abnormal   Collection  Time: 05/31/16  7:26 PM  Result Value Ref Range   Sodium 142 135 - 145 mmol/L   Potassium 2.8 (L) 3.5 - 5.1 mmol/L   Chloride 110 101 - 111 mmol/L   CO2 23 22 - 32 mmol/L   Glucose, Bld 186 (H) 65 - 99 mg/dL   BUN 15 6 - 20 mg/dL   Creatinine, Ser 0.67 0.44 - 1.00 mg/dL   Calcium 8.9 8.9 - 10.3 mg/dL   Total Protein 7.4 6.5 - 8.1 g/dL   Albumin 3.9 3.5 - 5.0 g/dL   AST 39 15 - 41 U/L   ALT 27 14 - 54 U/L   Alkaline Phosphatase 78 38 - 126 U/L   Total Bilirubin 4.9 (H) 0.3 - 1.2 mg/dL   GFR calc non Af Amer >60 >60 mL/min   GFR calc Af Amer >60 >60 mL/min    Comment: (NOTE) The eGFR has been calculated using the CKD EPI equation. This calculation has not been validated in all clinical situations. eGFR's persistently <60 mL/min signify possible Chronic Kidney Disease.    Anion gap 9 5 - 15  CBC     Status: Abnormal   Collection Time: 05/31/16  7:26 PM  Result Value Ref Range   WBC 34.1 (H) 4.0 - 10.5 K/uL   RBC 3.23 (L) 3.87 - 5.11 MIL/uL   Hemoglobin 7.4 (L) 12.0 - 15.0 g/dL   HCT 21.9 (L) 36.0 - 46.0 %   MCV 67.8 (L) 78.0 - 100.0 fL   MCH 22.9 (L) 26.0 - 34.0 pg   MCHC 33.8 30.0 - 36.0 g/dL   RDW 17.4 (H) 11.5 - 15.5 %   Platelets 256 150 - 400 K/uL  Magnesium     Status: None   Collection Time: 05/31/16  7:26 PM  Result Value Ref Range   Magnesium 2.1 1.7 - 2.4 mg/dL  CK     Status: None   Collection Time: 05/31/16  7:26 PM  Result Value Ref Range   Total CK 112 38 - 234 U/L  Differential     Status: Abnormal   Collection Time: 05/31/16  7:26 PM  Result Value Ref Range   Neutrophils Relative % 97 %   Lymphocytes Relative 1 %   Monocytes Relative 2 %   Eosinophils Relative 0 %   Basophils Relative 0 %   Band Neutrophils 0 %   Metamyelocytes Relative 0 %   Myelocytes 0 %   Promyelocytes Absolute 0 %   Blasts 0 %   nRBC 0 0 /100 WBC   Other 0 %   Neutro Abs 33.1 (H) 1.7 - 7.7 K/uL   Lymphs Abs 0.3 (L) 0.7 - 4.0 K/uL   Monocytes Absolute 0.7 0.1 - 1.0  K/uL    Eosinophils Absolute 0.0 0.0 - 0.7 K/uL   Basophils Absolute 0.0 0.0 - 0.1 K/uL   RBC Morphology POLYCHROMASIA PRESENT     Comment: TARGET CELLS SICKLE CELLS    WBC Morphology VACUOLATED NEUTROPHILS   Glucose, synovial fluid     Status: None   Collection Time: 06/01/16 12:37 AM  Result Value Ref Range   Glucose, Synovial Fluid <10 mg/dL    Comment: (NOTE) Result repeated and verified. Reference range: Synovial fluid glucose values are equivalent to plasma values if obtained from a fasting patient. The difference between the plasma glucose and synovial fluid glucose value should be < 10 mg/dL. Performed at [facility], Synovial Fluid     Status: None   Collection Time: 06/01/16 12:37 AM  Result Value Ref Range   Protein, Synovial Fluid 1.5 1.0 - 3.0 g/dL    Comment: Result repeated and verified. Performed at [facility]   Synovial cell count + diff, w/ crystals     Status: Abnormal   Collection Time: 06/01/16 12:37 AM  Result Value Ref Range   Color, Synovial YELLOW YELLOW   Appearance-Synovial TURBID (A) CLEAR   Crystals, Fluid NO CRYSTALS SEEN    WBC, Synovial 79,200 (H) 0 - 200 /cu mm   Neutrophil, Synovial 82 (H) 0 - 25 %   Lymphocytes-Synovial Fld 2 0 - 20 %   Monocyte-Macrophage-Synovial Fluid 16 (L) 50 - 90 %   Other Cells-SYN CYTOSPIN SMEAR   Urinalysis, Routine w reflex microscopic     Status: Abnormal   Collection Time: 06/01/16  2:57 AM  Result Value Ref Range   Color, Urine YELLOW YELLOW   APPearance HAZY (A) CLEAR   Specific Gravity, Urine 1.012 1.005 - 1.030   pH 5.0 5.0 - 8.0   Glucose, UA 50 (A) NEGATIVE mg/dL   Hgb urine dipstick MODERATE (A) NEGATIVE   Bilirubin Urine NEGATIVE NEGATIVE   Ketones, ur NEGATIVE NEGATIVE mg/dL   Protein, ur >=300 (A) NEGATIVE mg/dL   Nitrite NEGATIVE NEGATIVE   Leukocytes, UA MODERATE (A) NEGATIVE   RBC / HPF 6-30 0 - 5 RBC/hpf   WBC, UA 6-30 0 - 5 WBC/hpf   Bacteria, UA RARE (A) NONE  SEEN   Squamous Epithelial / LPF 0-5 (A) NONE SEEN   Hyaline Casts, UA PRESENT   Surgical pcr screen     Status: None   Collection Time: 06/01/16  5:16 AM  Result Value Ref Range   MRSA, PCR NEGATIVE NEGATIVE   Staphylococcus aureus NEGATIVE NEGATIVE    Comment:        The Xpert SA Assay (FDA approved for NASAL specimens in patients over 66 years of age), is one component of a comprehensive surveillance program.  Test performance has been validated by [facility] for patients greater than or equal to 34 year old. It is not intended to diagnose infection nor to guide or monitor treatment.   Aerobic/Anaerobic Culture (surgical/deep wound)     Status: None (Preliminary result)   Collection Time: 06/01/16  7:06 AM  Result Value Ref Range   Specimen Description ABSCESS LEFT KNEE    Special Requests NONE    Gram Stain      ABUNDANT WBC PRESENT, PREDOMINANTLY PMN FEW GRAM POSITIVE COCCI IN PAIRS RARE GRAM NEGATIVE RODS    Culture PENDING    Report Status PENDING   CBC     Status: Abnormal   Collection Time: 06/01/16  8:29  AM  Result Value Ref Range   WBC 34.7 (H) 4.0 - 10.5 K/uL   RBC 2.68 (L) 3.87 - 5.11 MIL/uL   Hemoglobin 6.2 (LL) 12.0 - 15.0 g/dL    Comment: REPEATED TO VERIFY CRITICAL RESULT CALLED TO, READ BACK BY AND VERIFIED WITH: B LABBATO,RN 0857 06/01/16 D BRADLEY    HCT 18.4 (L) 36.0 - 46.0 %   MCV 68.7 (L) 78.0 - 100.0 fL   MCH 23.1 (L) 26.0 - 34.0 pg   MCHC 33.7 30.0 - 36.0 g/dL   RDW 16.4 (H) 11.5 - 15.5 %   Platelets 205 150 - 400 K/uL  Creatinine, serum     Status: None   Collection Time: 06/01/16  8:29 AM  Result Value Ref Range   Creatinine, Ser 0.68 0.44 - 1.00 mg/dL   GFR calc non Af Amer >60 >60 mL/min   GFR calc Af Amer >60 >60 mL/min    Comment: (NOTE) The eGFR has been calculated using the CKD EPI equation. This calculation has not been validated in all clinical situations. eGFR's persistently <60 mL/min signify possible Chronic  Kidney Disease.   Basic metabolic panel     Status: Abnormal   Collection Time: 06/01/16  9:00 AM  Result Value Ref Range   Sodium 145 135 - 145 mmol/L   Potassium 3.3 (L) 3.5 - 5.1 mmol/L   Chloride 116 (H) 101 - 111 mmol/L   CO2 24 22 - 32 mmol/L   Glucose, Bld 154 (H) 65 - 99 mg/dL   BUN 12 6 - 20 mg/dL   Creatinine, Ser 0.63 0.44 - 1.00 mg/dL   Calcium 8.3 (L) 8.9 - 10.3 mg/dL   GFR calc non Af Amer >60 >60 mL/min   GFR calc Af Amer >60 >60 mL/min    Comment: (NOTE) The eGFR has been calculated using the CKD EPI equation. This calculation has not been validated in all clinical situations. eGFR's persistently <60 mL/min signify possible Chronic Kidney Disease.    Anion gap 5 5 - 15  Vitamin B12     Status: Abnormal   Collection Time: 06/01/16  9:00 AM  Result Value Ref Range   Vitamin B-12 1,670 (H) 180 - 914 pg/mL    Comment: (NOTE) This assay is not validated for testing neonatal or myeloproliferative syndrome specimens for Vitamin B12 levels.   Iron and TIBC     Status: Abnormal   Collection Time: 06/01/16  9:00 AM  Result Value Ref Range   Iron 8 (L) 28 - 170 ug/dL   TIBC 220 (L) 250 - 450 ug/dL   Saturation Ratios 4 (L) 10.4 - 31.8 %   UIBC 212 ug/dL  Ferritin     Status: Abnormal   Collection Time: 06/01/16  9:00 AM  Result Value Ref Range   Ferritin 321 (H) 11 - 307 ng/mL  Reticulocytes     Status: Abnormal   Collection Time: 06/01/16  9:00 AM  Result Value Ref Range   Retic Ct Pct 4.8 (H) 0.4 - 3.1 %   RBC. 2.67 (L) 3.87 - 5.11 MIL/uL   Retic Count, Manual 128.2 19.0 - 186.0 K/uL  Folate     Status: None   Collection Time: 06/01/16  9:30 AM  Result Value Ref Range   Folate 15.8 >5.9 ng/mL  Prepare RBC     Status: None   Collection Time: 06/01/16 10:03 AM  Result Value Ref Range   Order Confirmation ORDER PROCESSED BY BLOOD BANK  Type and screen [place]     Status: None (Preliminary result)   Collection Time: 06/01/16 10:03 AM   Result Value Ref Range   ABO/RH(D) O POS    Antibody Screen NEG    Sample Expiration 06/04/2016    Unit Number [id number]    Blood Component Type RBC LR PHER1    Unit division 00    Status of Unit ISSUED    Transfusion Status OK TO TRANSFUSE    Crossmatch Result Compatible    Unit Number [id number]    Blood Component Type RBC LR PHER2    Unit division 00    Status of Unit ALLOCATED    Transfusion Status OK TO TRANSFUSE    Crossmatch Result Compatible   ABO/Rh     Status: None   Collection Time: 06/01/16 10:03 AM  Result Value Ref Range   ABO/RH(D) O POS       Component Value Date/Time   SDES ABSCESS LEFT KNEE 06/01/2016 0706   SPECREQUEST NONE 06/01/2016 0706   CULT PENDING 06/01/2016 0706   REPTSTATUS PENDING 06/01/2016 0706   Dg Chest 2 View  Result Date: 06/01/2016 CLINICAL DATA:  Acute onset of fever and disorientation. Initial encounter. EXAM: CHEST  2 VIEW COMPARISON:  None. FINDINGS: The lungs are well-aerated. Vascular congestion is noted. Minimal bilateral opacities may reflect atelectasis or possibly mild infection. There is no evidence of pleural effusion or pneumothorax. The heart is enlarged. No acute osseous abnormalities are seen. Chronic sclerosis is seen at the humeral heads bilaterally. IMPRESSION: Vascular congestion and cardiomegaly noted. Minimal bilateral opacities may reflect atelectasis or possibly mild infection. Electronically Signed   By: Garald Balding M.D.   On: 06/01/2016 00:03   Dg Knee Left Port  Result Date: 06/01/2016 CLINICAL DATA:  Patient status post debridement of the left knee with drain placement. EXAM: PORTABLE LEFT KNEE - 1-2 VIEW COMPARISON:  None. FINDINGS: Drainage catheter projects over the anterior knee. Tricompartmental osteoarthritis. Normal anatomic alignment. No evidence for acute fracture. IMPRESSION: Drain projects over the anterior knee soft tissues. Tricompartmental osteoarthritis. Electronically Signed   By: Lovey Newcomer M.D.   On: 06/01/2016 10:05   Dg Foot Complete Right  Result Date: 06/01/2016 CLINICAL DATA:  Acute onset of right dorsal foot pain and swelling. Initial encounter. EXAM: RIGHT FOOT COMPLETE - 3+ VIEW COMPARISON:  None. FINDINGS: There is no evidence of fracture or dislocation. Degenerative change is noted at the medial aspect of the midfoot. There is no evidence of talar subluxation; the subtalar joint is unremarkable in appearance. A plantar calcaneal spur is seen. No significant soft tissue abnormalities are seen. IMPRESSION: 1. No evidence of fracture or dislocation. 2. Degenerative change at the medial aspect of the midfoot. Electronically Signed   By: Garald Balding M.D.   On: 06/01/2016 00:04   US Abdomen Limited Ruq  Result Date: 06/01/2016 CLINICAL DATA:  Acute onset of fever.  Initial encounter. EXAM: US ABDOMEN LIMITED - RIGHT UPPER QUADRANT COMPARISON:  None. FINDINGS: Gallbladder: Multiple layering stones are seen dependently within the gallbladder, measuring up to 9 mm in size. No gallbladder wall thickening or pericholecystic fluid is seen. No ultrasonographic Murphy's sign is elicited. Common bile duct: Diameter: 0.4 cm, within normal limits in caliber. Liver: No focal lesion identified. Within normal limits in parenchymal echogenicity. IMPRESSION: Cholelithiasis. Gallbladder otherwise unremarkable. No evidence for cholecystitis or obstruction. Electronically Signed   By: Garald Balding M.D.   On: 06/01/2016 01:19  Recent Results (from the past 240 hour(s))  Body fluid culture     Status: None (Preliminary result)   Collection Time: 05/31/16 12:43 AM  Result Value Ref Range Status   Specimen Description KNEE LEFT  Final   Special Requests NONE  Final   Gram Stain   Final    WBC PRESENT, PREDOMINANTLY PMN GRAM POSITIVE COCCI IN PAIRS CYTOSPIN SMEAR Gram Stain Report Called to,Read Back By and Verified With: L.ADKINS AT 0201 ON 06/01/16 BY W.SHEA    Culture PENDING   Incomplete   Report Status PENDING  Incomplete  Surgical pcr screen     Status: None   Collection Time: 06/01/16  5:16 AM  Result Value Ref Range Status   MRSA, PCR NEGATIVE NEGATIVE Final   Staphylococcus aureus NEGATIVE NEGATIVE Final    Comment:        The Xpert SA Assay (FDA approved for NASAL specimens in patients over 25 years of age), is one component of a comprehensive surveillance program.  Test performance has been validated by [facility] for patients greater than or equal to 47 year old. It is not intended to diagnose infection nor to guide or monitor treatment.   Aerobic/Anaerobic Culture (surgical/deep wound)     Status: None (Preliminary result)   Collection Time: 06/01/16  7:06 AM  Result Value Ref Range Status   Specimen Description ABSCESS LEFT KNEE  Final   Special Requests NONE  Final   Gram Stain   Final    ABUNDANT WBC PRESENT, PREDOMINANTLY PMN FEW GRAM POSITIVE COCCI IN PAIRS RARE GRAM NEGATIVE RODS    Culture PENDING  Incomplete   Report Status PENDING  Incomplete      06/01/2016, 3:41 PM     LOS: 0 days    Records and images were personally reviewed where available.

## 2016-06-01 NOTE — Progress Notes (Signed)
2nd unit of blood started. Pt assisted back to bed with the assist of 3 people. Pt c/o R foot, LLE and "entire" body aches. Pt continues to experience nausea and vomiting and abdominal tenderness. Pt able to void on bedpan on assist to bed. Will continue to monitor.

## 2016-06-01 NOTE — H&P (Addendum)
History and Physical    Angie Wright [id number] DOB: 02-05-1956 DOA: 05/31/2016   PCP: No PCP Per Patient Chief Complaint:  Chief Complaint  Patient presents with  . Fever  . Generalized Body Aches  . Nausea    HPI: Angie Wright is a 60 y.o. female with medical history significant of Anemia (baseline HGB is "between 7 and 8 always at the doctors office" she says), this due to "sickle cell trait" she states.  Patient presents to the ED with c/o fever, bodyaches, L knee pain and swelling.  Symptoms onset yesterday morning with vomiting.  Intermittent nausea since then.  Fever 101.5 here in ED but no fever at home.  ED Course: Tap of L knee: 79k WBC (PMNs), GS shows GPC in pairs.  Initially got rocephin and vanc.  Ortho wants patient to get admitted to [facility] tonight so they can take patient directly to OR for washout / source control.  Review of Systems: As per HPI otherwise 10 point review of systems negative.    Past Medical History:  Diagnosis Date  . Anemia    states "sickle cell trait, HGB always runs between 7 and 8"    History reviewed. No pertinent surgical history.   reports that she has never smoked. She has never used smokeless tobacco. She reports that she does not drink alcohol or use drugs.  No Known Allergies  Family History  Problem Relation Age of Onset  . Sickle cell anemia Brother     2 brothers passed with sickle cell anemia      Prior to Admission medications   Not on File    Physical Exam: Vitals:   05/31/16 1837 05/31/16 2303 06/01/16 0108 06/01/16 0113  BP: 160/64 159/81 148/66   Pulse: 99 (!) 123 108   Resp: 16 20 (!) 28   Temp: 101.5 F (38.6 C)   99.2 F (37.3 C)  TempSrc: Oral   Oral  SpO2: 99% 100% 95%   Weight:   78 kg (172 lb)   Height:   5\' 6"  (1.676 m)       Constitutional: NAD, calm, comfortable Eyes: PERRL, lids and conjunctivae normal ENMT: Mucous membranes are moist. Posterior pharynx clear of any exudate or  lesions.Normal dentition.  Neck: normal, supple, no masses, no thyromegaly Respiratory: clear to auscultation bilaterally, no wheezing, no crackles. Normal respiratory effort. No accessory muscle use.  Cardiovascular: Regular rate and rhythm, no murmurs / rubs / gallops. No extremity edema. 2+ pedal pulses. No carotid bruits.  Abdomen: no tenderness, no masses palpated. No hepatosplenomegaly. Bowel sounds positive.  Musculoskeletal: Knee pain. Skin: no rashes, lesions, ulcers. No induration Neurologic: CN 2-12 grossly intact. Sensation intact, DTR normal. Strength 5/5 in all 4.  Psychiatric: Normal judgment and insight. Alert and oriented x 3. Normal mood.    Labs on Admission: I have personally reviewed following labs and imaging studies  CBC:  Recent Labs Lab 05/31/16 1926  WBC 34.1*  NEUTROABS 33.1*  HGB 7.4*  HCT 21.9*  MCV 67.8*  PLT 123456   Basic Metabolic Panel:  Recent Labs Lab 05/31/16 1926  NA 142  K 2.8*  CL 110  CO2 23  GLUCOSE 186*  BUN 15  CREATININE 0.67  CALCIUM 8.9  MG 2.1   GFR: Estimated Creatinine Clearance: 77.9 mL/min (by C-G formula based on SCr of 0.67 mg/dL). Liver Function Tests:  Recent Labs Lab 05/31/16 1926  AST 39  ALT 27  ALKPHOS 78  BILITOT  4.9*  PROT 7.4  ALBUMIN 3.9    Recent Labs Lab 05/31/16 1926  LIPASE 19   No results for input(s): AMMONIA in the last 168 hours. Coagulation Profile: No results for input(s): INR, PROTIME in the last 168 hours. Cardiac Enzymes:  Recent Labs Lab 05/31/16 1926  CKTOTAL 112   BNP (last 3 results) No results for input(s): PROBNP in the last 8760 hours. HbA1C: No results for input(s): HGBA1C in the last 72 hours. CBG: No results for input(s): GLUCAP in the last 168 hours. Lipid Profile: No results for input(s): CHOL, HDL, LDLCALC, TRIG, CHOLHDL, LDLDIRECT in the last 72 hours. Thyroid Function Tests: No results for input(s): TSH, T4TOTAL, FREET4, T3FREE, THYROIDAB in the last  72 hours. Anemia Panel: No results for input(s): VITAMINB12, FOLATE, FERRITIN, TIBC, IRON, RETICCTPCT in the last 72 hours. Urine analysis: No results found for: COLORURINE, APPEARANCEUR, LABSPEC, [place], GLUCOSEU, HGBUR, BILIRUBINUR, [place], PROTEINUR, UROBILINOGEN, NITRITE, LEUKOCYTESUR Sepsis Labs: @LABRCNTIP (procalcitonin:4,lacticidven:4) ) Recent Results (from the past 240 hour(s))  Body fluid culture     Status: None (Preliminary result)   Collection Time: 05/31/16 12:43 AM  Result Value Ref Range Status   Specimen Description KNEE LEFT  Final   Special Requests NONE  Final   Gram Stain   Final    WBC PRESENT, PREDOMINANTLY PMN GRAM POSITIVE COCCI IN PAIRS CYTOSPIN SMEAR Gram Stain Report Called to,Read Back By and Verified With: L.ADKINS AT 0201 ON 06/01/16 BY W.SHEA    Culture PENDING  Incomplete   Report Status PENDING  Incomplete     Radiological Exams on Admission: Dg Chest 2 View  Result Date: 06/01/2016 CLINICAL DATA:  Acute onset of fever and disorientation. Initial encounter. EXAM: CHEST  2 VIEW COMPARISON:  None. FINDINGS: The lungs are well-aerated. Vascular congestion is noted. Minimal bilateral opacities may reflect atelectasis or possibly mild infection. There is no evidence of pleural effusion or pneumothorax. The heart is enlarged. No acute osseous abnormalities are seen. Chronic sclerosis is seen at the humeral heads bilaterally. IMPRESSION: Vascular congestion and cardiomegaly noted. Minimal bilateral opacities may reflect atelectasis or possibly mild infection. Electronically Signed   By: Garald Balding M.D.   On: 06/01/2016 00:03   Dg Foot Complete Right  Result Date: 06/01/2016 CLINICAL DATA:  Acute onset of right dorsal foot pain and swelling. Initial encounter. EXAM: RIGHT FOOT COMPLETE - 3+ VIEW COMPARISON:  None. FINDINGS: There is no evidence of fracture or dislocation. Degenerative change is noted at the medial aspect of the midfoot. There is no  evidence of talar subluxation; the subtalar joint is unremarkable in appearance. A plantar calcaneal spur is seen. No significant soft tissue abnormalities are seen. IMPRESSION: 1. No evidence of fracture or dislocation. 2. Degenerative change at the medial aspect of the midfoot. Electronically Signed   By: Garald Balding M.D.   On: 06/01/2016 00:04   US Abdomen Limited Ruq  Result Date: 06/01/2016 CLINICAL DATA:  Acute onset of fever.  Initial encounter. EXAM: US ABDOMEN LIMITED - RIGHT UPPER QUADRANT COMPARISON:  None. FINDINGS: Gallbladder: Multiple layering stones are seen dependently within the gallbladder, measuring up to 9 mm in size. No gallbladder wall thickening or pericholecystic fluid is seen. No ultrasonographic Murphy's sign is elicited. Common bile duct: Diameter: 0.4 cm, within normal limits in caliber. Liver: No focal lesion identified. Within normal limits in parenchymal echogenicity. IMPRESSION: Cholelithiasis. Gallbladder otherwise unremarkable. No evidence for cholecystitis or obstruction. Electronically Signed   By: Garald Balding M.D.   On:  06/01/2016 01:19    EKG: Independently reviewed.  Assessment/Plan Principal Problem:   Septic arthritis of knee, left (HCC) Active Problems:   Sepsis, Gram positive (HCC)   Anemia   Sickle cell trait ([place])    1. Septic arthritis of knee - due to GPC in pairs 1. Vanc per pharm 2. Culture pending of knee aspirate, BCx not able to be obtained prior to ABx (but likely okay given that knee aspirate were obtained and clearly shows an organism). 3. Patient to [facility] per Dr. Rolena Infante for OR washout 4. IVF bolus for sepsis and 125 cc/hr after 5. Repeat CBC and BMP in AM 6. Zofran PRN nausea 7. Dilaudid PRN pain 2. Anemia - HGB of 7.4, patient clearly states to me she is "sure" that this is her baseline all her life.  She states she has "sickle cell trait", 2 brothers died of sickle cell disease.  She clearly states that HGB "always runs about a  7, maybe an 8 sometimes". 1. Will accept that this is baseline based on clear history that patient is giving me and hold off on transfusion. 2. Will do anemia PNL 3. And hemoglobin electrophoresis, as sickle cell trait shouldn't by itself be causing an anemia like this.  Maybe a sickle cell trait / thalassemia to be causing anemia but not sickling crises? 4. Will type and screen patient as well   DVT prophylaxis: SCDs only for now due to planned OR Code Status: Full Family Communication: Husband at bedside Consults called: Dr. Rolena Infante with ortho Admission status: Admit to inpatient   Etta Quill DO [facility] Pager [phone] from 7PM-7AM  If 7AM-7PM, please contact the day physician for the patient [email or web address] Password [facility]  06/01/2016, 2:47 AM

## 2016-06-01 NOTE — Brief Op Note (Signed)
05/31/2016 - 06/01/2016  7:25 AM  PATIENT:  Angie Wright  60 y.o. female  PRE-OPERATIVE DIAGNOSIS:  infected left knee  POST-OPERATIVE DIAGNOSIS:  infected left knee  PROCEDURE:  Procedure(s): ARTHROSCOPIC WASHOUT LEFT KNEE (Left)  SURGEON:  Surgeon(s) and Role:    * Melina Schools, MD - Primary  PHYSICIAN ASSISTANT:   ASSISTANTS: none   ANESTHESIA:   general  EBL:  No intake/output data recorded.  BLOOD ADMINISTERED:none  DRAINS: 1 drain    LOCAL MEDICATIONS USED:  NONE  SPECIMEN:  Source of Specimen:  left knee  DISPOSITION OF SPECIMEN:  micro  COUNTS:  YES  TOURNIQUET:  * No tourniquets in log *  DICTATION: .Other Dictation: Dictation Number [id number]  PLAN OF CARE: Admit to inpatient   PATIENT DISPOSITION:  PACU - hemodynamically stable.

## 2016-06-01 NOTE — Progress Notes (Signed)
Rept to Dr. Broadus John regarding pt's temp and vital signs 15 mins after starting blood. Pt denies back pain, SOB, itching, CP or any other assoc s/sx. Per Dr. Delene Loll order. Will give PO Benadryl and will recheck VS in approx 45 mins and restart blood if vital signs stable. Will continue to monitor.

## 2016-06-01 NOTE — ED Notes (Signed)
Main lab is busy but will come to stick pt when they can. Antibiotics will be hung before blood cultures to prevent delay of care.

## 2016-06-01 NOTE — ED Triage Notes (Signed)
Attempted to call [place] [facility] to give report, no answer x 2

## 2016-06-02 ENCOUNTER — Inpatient Hospital Stay (HOSPITAL_COMMUNITY): Payer: BLUE CROSS/BLUE SHIELD

## 2016-06-02 ENCOUNTER — Encounter (HOSPITAL_COMMUNITY): Payer: Self-pay | Admitting: Orthopedic Surgery

## 2016-06-02 DIAGNOSIS — M7989 Other specified soft tissue disorders: Secondary | ICD-10-CM

## 2016-06-02 DIAGNOSIS — M79671 Pain in right foot: Secondary | ICD-10-CM

## 2016-06-02 DIAGNOSIS — A4189 Other specified sepsis: Secondary | ICD-10-CM

## 2016-06-02 LAB — COMPREHENSIVE METABOLIC PANEL
ALBUMIN: 2.7 g/dL — AB (ref 3.5–5.0)
ALT: 18 U/L (ref 14–54)
ANION GAP: 7 (ref 5–15)
AST: 20 U/L (ref 15–41)
Alkaline Phosphatase: 75 U/L (ref 38–126)
BILIRUBIN TOTAL: 2.8 mg/dL — AB (ref 0.3–1.2)
BUN: 12 mg/dL (ref 6–20)
CO2: 25 mmol/L (ref 22–32)
Calcium: 8.8 mg/dL — ABNORMAL LOW (ref 8.9–10.3)
Chloride: 111 mmol/L (ref 101–111)
Creatinine, Ser: 0.81 mg/dL (ref 0.44–1.00)
GFR calc non Af Amer: >60 mL/min (ref >60)
GLUCOSE: 118 mg/dL — AB (ref 65–99)
POTASSIUM: 3.1 mmol/L — AB (ref 3.5–5.1)
Sodium: 143 mmol/L (ref 135–145)
TOTAL PROTEIN: 6.4 g/dL — AB (ref 6.5–8.1)

## 2016-06-02 LAB — CBC
HEMATOCRIT: 23 % — AB (ref 36.0–46.0)
HEMOGLOBIN: 8.1 g/dL — AB (ref 12.0–15.0)
MCH: 24.8 pg — ABNORMAL LOW (ref 26.0–34.0)
MCHC: 35.2 g/dL (ref 30.0–36.0)
MCV: 70.6 fL — ABNORMAL LOW (ref 78.0–100.0)
Platelets: 192 10*3/uL (ref 150–400)
RBC: 3.26 MIL/uL — AB (ref 3.87–5.11)
RDW: 16.1 % — ABNORMAL HIGH (ref 11.5–15.5)
WBC: 27.7 10*3/uL — AB (ref 4.0–10.5)

## 2016-06-02 LAB — SEDIMENTATION RATE: Sed Rate: 97 mm/hr — ABNORMAL HIGH (ref 0–22)

## 2016-06-02 LAB — C-REACTIVE PROTEIN: CRP: 31.4 mg/dL — ABNORMAL HIGH (ref <1.0)

## 2016-06-02 MED ORDER — POTASSIUM CHLORIDE CRYS ER 20 MEQ PO TBCR
40.0000 meq | EXTENDED_RELEASE_TABLET | Freq: Once | ORAL | Status: AC
  Administered 2016-06-02: 40 meq via ORAL
  Filled 2016-06-02: qty 2

## 2016-06-02 MED ORDER — IRON DEXTRAN 50 MG/ML IJ SOLN
250.0000 mg | Freq: Once | INTRAMUSCULAR | Status: AC
  Administered 2016-06-02: 250 mg via INTRAVENOUS
  Filled 2016-06-02 (×2): qty 5

## 2016-06-02 MED ORDER — SODIUM CHLORIDE 0.9% FLUSH
10.0000 mL | INTRAVENOUS | Status: DC | PRN
  Administered 2016-06-03: 10 mL
  Filled 2016-06-02: qty 40

## 2016-06-02 MED ORDER — GADOBENATE DIMEGLUMINE 529 MG/ML IV SOLN
15.0000 mL | Freq: Once | INTRAVENOUS | Status: AC
  Administered 2016-06-02: 15 mL via INTRAVENOUS

## 2016-06-02 MED ORDER — IRON DEXTRAN 50 MG/ML IJ SOLN
25.0000 mg | Freq: Once | INTRAMUSCULAR | Status: AC
  Administered 2016-06-02: 25 mg via INTRAVENOUS
  Filled 2016-06-02: qty 0.5

## 2016-06-02 NOTE — Progress Notes (Signed)
    Subjective: 1 Day Post-Op Procedure(s) (LRB): ARTHROSCOPIC WASHOUT LEFT KNEE (Left) Patient reports pain as 5 on 0-10 scale.   Denies CP or SOB.  Voiding without difficulty. Positive flatus. Objective: Vital signs in last 24 hours: Temp:  [98.2 F (36.8 C)-101.2 F (38.4 C)] 98.5 F (36.9 C) (12/29 0420) Pulse Rate:  [85-99] 88 (12/29 0420) Resp:  [13-21] 16 (12/29 0420) BP: (117-140)/(44-67) 137/62 (12/29 0420) SpO2:  [97 %-100 %] 100 % (12/29 0420)  Intake/Output from previous day: 12/28 0701 - 12/29 0700 In: 2959 [P.O.:480; I.V.:1115; Blood:1074; IV Piggyback:250] Out: 596 [Urine:575; Emesis/NG output:1; Drains:20] Intake/Output this shift: No intake/output data recorded.  Labs:  Recent Labs  05/31/16 1926 06/01/16 0829 06/02/16 0538  HGB 7.4* 6.2* 8.1*    Recent Labs  06/01/16 0829 06/01/16 0900 06/02/16 0538  WBC 34.7*  --  27.7*  RBC 2.68* 2.67* 3.26*  HCT 18.4*  --  23.0*  PLT 205  --  192    Recent Labs  06/01/16 0900 06/02/16 0538  NA 145 143  K 3.3* 3.1*  CL 116* 111  CO2 24 25  BUN 12 12  CREATININE 0.63 0.81  GLUCOSE 154* 118*  CALCIUM 8.3* 8.8*   No results for input(s): LABPT, INR in the last 72 hours.  Physical Exam: Neurologically intact ABD soft Intact pulses distally No cellulitis present Compartment soft right foot swelling and pain.  Difficulty applying weight to the foot  Assessment/Plan: 1 Day Post-Op Procedure(s) (LRB): ARTHROSCOPIC WASHOUT LEFT KNEE (Left) Advance diet Up with therapy  1. MRI of right foot to rule out infection 2. Pull drain today 3. Dressing change in AM 4. Rapid response reviewed - will differ management of issues to medical team 5. Awake final cultures and definitive abx management - per ID team  Cristal Qadir D for Dr. Melina Schools [facility] [phone] 06/02/2016, 7:29 AM

## 2016-06-02 NOTE — Progress Notes (Signed)
Peripherally Inserted Central Catheter/Midline Placement  The IV Nurse has discussed with the patient and/or persons authorized to consent for the patient, the purpose of this procedure and the potential benefits and risks involved with this procedure.  The benefits include less needle sticks, lab draws from the catheter, and the patient may be discharged home with the catheter. Risks include, but not limited to, infection, bleeding, blood clot (thrombus formation), and puncture of an artery; nerve damage and irregular heartbeat and possibility to perform a PICC exchange if needed/ordered by physician.  Alternatives to this procedure were also discussed.  Bard Power PICC patient education guide, fact sheet on infection prevention and patient information card has been provided to patient /or left at bedside.    PICC/Midline Placement Documentation        Angie Wright 06/02/2016, 8:17 AM

## 2016-06-02 NOTE — Progress Notes (Signed)
[place] for Infectious Disease    Date of Admission:  05/31/2016   Total days of antibiotics 3        Day 3 ceftriaxone        Day 3 vanco           ID: Angie Wright is a 60 y.o. female with left knee septic arthritis Principal Problem:   Septic arthritis of knee, left (HCC) Active Problems:   Sepsis, Gram positive (HCC)   Anemia   Sickle cell trait (HCC)    Subjective: Afebrile, fever curve trending down. POD #1 from I x D. Leukocytosis slowly trending down. She reports her right foot, dorsal aspect still tender.  She states her symptoms of knee and foot pain started shortly after she had fever/nausea/vomiting on 12/26  Medications:  . cefTRIAXone (ROCEPHIN)  IV  2 g Intravenous Q24H  . enoxaparin (LOVENOX) injection  40 mg Subcutaneous Daily  . ferrous sulfate  325 mg Oral TID PC  . iron dextran (INFED/DEXFERRUM) infusion  250 mg Intravenous Once  . vancomycin  1,000 mg Intravenous Q12H    Objective: Vital signs in last 24 hours: Temp:  [98.5 F (36.9 C)-101.2 F (38.4 C)] 99.7 F (37.6 C) (12/29 1342) Pulse Rate:  [85-99] 85 (12/29 1341) Resp:  [13-18] 16 (12/29 1341) BP: (123-140)/(47-62) 133/61 (12/29 1341) SpO2:  [98 %-100 %] 100 % (12/29 1341) Physical Exam  Constitutional:  oriented to person, place, and time. appears well-developed and well-nourished. No distress.  HENT: [place]/AT, PERRLA, no scleral icterus Mouth/Throat: Oropharynx is clear and moist. No oropharyngeal exudate.  Cardiovascular: Normal rate, regular rhythm and normal heart sounds. Exam reveals no gallop and no friction rub.  No murmur heard.  Pulmonary/Chest: Effort normal and breath sounds normal. No respiratory distress.  has no wheezes.  Neck = supple, no nuchal rigidity Abdominal: Soft. Bowel sounds are normal.  exhibits no distension. There is no tenderness.  Lymphadenopathy: no cervical adenopathy. No axillary adenopathy Neurological: alert and oriented to person, place, and  time.  Ext: left knee wrapped but also has right foot swelling Psychiatric: a normal mood and affect.  behavior is normal.    Lab Results  Recent Labs  06/01/16 0829 06/01/16 0900 06/02/16 0538  WBC 34.7*  --  27.7*  HGB 6.2*  --  8.1*  HCT 18.4*  --  23.0*  NA  --  145 143  K  --  3.3* 3.1*  CL  --  116* 111  CO2  --  24 25  BUN  --  12 12  CREATININE 0.68 0.63 0.81   Liver Panel  Recent Labs  05/31/16 1926 06/02/16 0538  PROT 7.4 6.4*  ALBUMIN 3.9 2.7*  AST 39 20  ALT 27 18  ALKPHOS 78 75  BILITOT 4.9* 2.8*   Sedimentation Rate  Recent Labs  06/02/16 0538  ESRSEDRATE 97*   C-Reactive Protein  Recent Labs  06/02/16 0538  CRP 31.4*    Microbiology: 12/27 synovial fluid cx + slowly growing 12/28 OR CX + slowly growing 12/28 blood cx NGTD Studies/Results: Dg Chest 2 View  Result Date: 06/01/2016 CLINICAL DATA:  Acute onset of fever and disorientation. Initial encounter. EXAM: CHEST  2 VIEW COMPARISON:  None. FINDINGS: The lungs are well-aerated. Vascular congestion is noted. Minimal bilateral opacities may reflect atelectasis or possibly mild infection. There is no evidence of pleural effusion or pneumothorax. The heart is enlarged. No acute osseous abnormalities are seen. Chronic sclerosis  is seen at the humeral heads bilaterally. IMPRESSION: Vascular congestion and cardiomegaly noted. Minimal bilateral opacities may reflect atelectasis or possibly mild infection. Electronically Signed   By: Garald Balding M.D.   On: 06/01/2016 00:03   Dg Knee Left Port  Result Date: 06/01/2016 CLINICAL DATA:  Patient status post debridement of the left knee with drain placement. EXAM: PORTABLE LEFT KNEE - 1-2 VIEW COMPARISON:  None. FINDINGS: Drainage catheter projects over the anterior knee. Tricompartmental osteoarthritis. Normal anatomic alignment. No evidence for acute fracture. IMPRESSION: Drain projects over the anterior knee soft tissues. Tricompartmental  osteoarthritis. Electronically Signed   By: Lovey Newcomer M.D.   On: 06/01/2016 10:05   Dg Foot Complete Right  Result Date: 06/01/2016 CLINICAL DATA:  Acute onset of right dorsal foot pain and swelling. Initial encounter. EXAM: RIGHT FOOT COMPLETE - 3+ VIEW COMPARISON:  None. FINDINGS: There is no evidence of fracture or dislocation. Degenerative change is noted at the medial aspect of the midfoot. There is no evidence of talar subluxation; the subtalar joint is unremarkable in appearance. A plantar calcaneal spur is seen. No significant soft tissue abnormalities are seen. IMPRESSION: 1. No evidence of fracture or dislocation. 2. Degenerative change at the medial aspect of the midfoot. Electronically Signed   By: Garald Balding M.D.   On: 06/01/2016 00:04   US Abdomen Limited Ruq  Result Date: 06/01/2016 CLINICAL DATA:  Acute onset of fever.  Initial encounter. EXAM: US ABDOMEN LIMITED - RIGHT UPPER QUADRANT COMPARISON:  None. FINDINGS: Gallbladder: Multiple layering stones are seen dependently within the gallbladder, measuring up to 9 mm in size. No gallbladder wall thickening or pericholecystic fluid is seen. No ultrasonographic Murphy's sign is elicited. Common bile duct: Diameter: 0.4 cm, within normal limits in caliber. Liver: No focal lesion identified. Within normal limits in parenchymal echogenicity. IMPRESSION: Cholelithiasis. Gallbladder otherwise unremarkable. No evidence for cholecystitis or obstruction. Electronically Signed   By: Garald Balding M.D.   On: 06/01/2016 01:19     Assessment/Plan: Septic arthritis of left knee = continue with vanco and ceftriaxone for now. Await cx results to narrow her regimen. She will need 4 wk at minimum. I am concerned that her prodome of n/v/fever was staph/strep toxin related. If her right foot is involved, she must have been transiently bacteremic to see 2 sites of left knee and right foot.   - recommend to get TTE - blood cx are negative but they  were collected after being on abtx  Right foot swelling =agree with ortho, she would benefit to rule out infection with right foot mri  Health maintenance = would check for HIV and HCV ab  Baxter Flattery [facility] Cell: [phone] Pager: [phone]  06/02/2016, 2:59 PM

## 2016-06-02 NOTE — Progress Notes (Signed)
PROGRESS NOTE    Angie Wright  [id number] DOB: Sep 17, 1955 DOA: 05/31/2016 PCP: No PCP Per Patient  Brief Narrative: Angie Wright is a 60 y.o. female with medical history significant of Anemia (baseline HGB is "between 7 and 8 always at the doctors office" she says), this due to "sickle cell trait" she states.  Patient presents to the ED with c/o fever, bodyaches, L knee pain and swelling  Assessment & Plan:   Principal Problem:   Septic arthritis of knee, left (HCC) -S/p Arthroscopic I and D of left knee.12/28 by Dr.Brooks -continue IV Vanc day2 -synovial fluid Cx with GPC in pairs and rare GNR -Blood Cx-NGTD -will consult ID -PICC line placed per Ortho    R ankle pain -MRI ordered by Dr.Brooks, will FU    Severe Iron deficiency anemia -chronic, with baseline of 7-8gm/dl -Hb down to 6.2, transfused 2units PRBC 12/28 -FU Hb electrophoresis, h/o [place] trait  DVT prophylaxis:Lovenox Code Status:Full COde Family Communication:family at bedside Disposition Plan:Home in few days   Consultants:   Ortho   Procedures:Arthroscopic I and D of left knee.12/28  Antimicrobials: Vancomycin 12/27 Subjective: Some L knee pain, feels tired  Objective: Vitals:   06/01/16 2140 06/02/16 0030 06/02/16 0420 06/02/16 1055  BP: (!) 137/50 (!) 123/56 137/62   Pulse: 90 87 88   Resp: 14 16 16    Temp: 99.2 F (37.3 C) 99.7 F (37.6 C) 98.5 F (36.9 C) 98.5 F (36.9 C)  TempSrc: Oral Oral Oral Oral  SpO2: 98% 99% 100%   Weight:      Height:        Intake/Output Summary (Last 24 hours) at 06/02/16 1142 Last data filed at 06/02/16 1114  Gross per 24 hour  Intake             2959 ml  Output             1215 ml  Net             1744 ml   Filed Weights   06/01/16 0108  Weight: 78 kg (172 lb)    Examination:  General exam: Appears calm and comfortable, AAOx3 Respiratory system: Clear to auscultation. Respiratory effort normal. Cardiovascular system: S1 & S2 heard, RRR. No  JVD, murmurs, rubs, gallops or clicks.  Gastrointestinal system: Abdomen is nondistended, soft and nontender. Normal bowel sounds heard. Central nervous system: Alert and oriented. No focal neurological deficits. Extremities: L knee with dressing Skin: No rashes, lesions or ulcers Psychiatry: Judgement and insight appear normal. Mood & affect appropriate.     Data Reviewed: I have personally reviewed following labs and imaging studies  CBC:  Recent Labs Lab 05/31/16 1926 06/01/16 0829 06/02/16 0538  WBC 34.1* 34.7* 27.7*  NEUTROABS 33.1*  --   --   HGB 7.4* 6.2* 8.1*  HCT 21.9* 18.4* 23.0*  MCV 67.8* 68.7* 70.6*  PLT 256 205 [id number]   Basic Metabolic Panel:  Recent Labs Lab 05/31/16 1926 06/01/16 0829 06/01/16 0900 06/02/16 0538  NA 142  --  145 143  K 2.8*  --  3.3* 3.1*  CL 110  --  116* 111  CO2 23  --  24 25  GLUCOSE 186*  --  154* 118*  BUN 15  --  12 12  CREATININE 0.67 0.68 0.63 0.81  CALCIUM 8.9  --  8.3* 8.8*  MG 2.1  --   --   --    GFR: Estimated Creatinine Clearance: 76.9 mL/min (  by C-G formula based on SCr of 0.81 mg/dL). Liver Function Tests:  Recent Labs Lab 05/31/16 1926 06/02/16 0538  AST 39 20  ALT 27 18  ALKPHOS 78 75  BILITOT 4.9* 2.8*  PROT 7.4 6.4*  ALBUMIN 3.9 2.7*    Recent Labs Lab 05/31/16 1926  LIPASE 19   No results for input(s): AMMONIA in the last 168 hours. Coagulation Profile: No results for input(s): INR, PROTIME in the last 168 hours. Cardiac Enzymes:  Recent Labs Lab 05/31/16 1926  CKTOTAL 112   BNP (last 3 results) No results for input(s): PROBNP in the last 8760 hours. HbA1C: No results for input(s): HGBA1C in the last 72 hours. CBG: No results for input(s): GLUCAP in the last 168 hours. Lipid Profile: No results for input(s): CHOL, HDL, LDLCALC, TRIG, CHOLHDL, LDLDIRECT in the last 72 hours. Thyroid Function Tests: No results for input(s): TSH, T4TOTAL, FREET4, T3FREE, THYROIDAB in the last 72  hours. Anemia Panel:  Recent Labs  06/01/16 0900 06/01/16 0930  VITAMINB12 1,670*  --   FOLATE  --  15.8  FERRITIN 321*  --   TIBC 220*  --   IRON 8*  --   RETICCTPCT 4.8*  --    Urine analysis:    Component Value Date/Time   COLORURINE YELLOW 06/01/2016 0257   APPEARANCEUR HAZY (A) 06/01/2016 0257   LABSPEC 1.012 06/01/2016 0257   PHURINE 5.0 06/01/2016 0257   GLUCOSEU 50 (A) 06/01/2016 0257   HGBUR MODERATE (A) 06/01/2016 0257   BILIRUBINUR NEGATIVE 06/01/2016 0257   KETONESUR NEGATIVE 06/01/2016 0257   PROTEINUR >=300 (A) 06/01/2016 0257   NITRITE NEGATIVE 06/01/2016 0257   LEUKOCYTESUR MODERATE (A) 06/01/2016 0257   Sepsis Labs: @LABRCNTIP (procalcitonin:4,lacticidven:4)  ) Recent Results (from the past 240 hour(s))  Body fluid culture     Status: None (Preliminary result)   Collection Time: 05/31/16 12:43 AM  Result Value Ref Range Status   Specimen Description KNEE LEFT  Final   Special Requests NONE  Final   Gram Stain   Final    WBC PRESENT, PREDOMINANTLY PMN GRAM POSITIVE COCCI IN PAIRS CYTOSPIN SMEAR Gram Stain Report Called to,Read Back By and Verified With: L.ADKINS AT 0201 ON 06/01/16 BY W.SHEA    Culture PENDING  Incomplete   Report Status PENDING  Incomplete  Surgical pcr screen     Status: None   Collection Time: 06/01/16  5:16 AM  Result Value Ref Range Status   MRSA, PCR NEGATIVE NEGATIVE Final   Staphylococcus aureus NEGATIVE NEGATIVE Final    Comment:        The Xpert SA Assay (FDA approved for NASAL specimens in patients over 39 years of age), is one component of a comprehensive surveillance program.  Test performance has been validated by [facility] for patients greater than or equal to 13 year old. It is not intended to diagnose infection nor to guide or monitor treatment.   Aerobic/Anaerobic Culture (surgical/deep wound)     Status: None (Preliminary result)   Collection Time: 06/01/16  7:06 AM  Result Value Ref Range Status    Specimen Description ABSCESS LEFT KNEE  Final   Special Requests NONE  Final   Gram Stain   Final    ABUNDANT WBC PRESENT, PREDOMINANTLY PMN FEW GRAM POSITIVE COCCI IN PAIRS RARE GRAM NEGATIVE RODS    Culture PENDING  Incomplete   Report Status PENDING  Incomplete         Radiology Studies: Dg Chest 2 View  Result Date: 06/01/2016 CLINICAL DATA:  Acute onset of fever and disorientation. Initial encounter. EXAM: CHEST  2 VIEW COMPARISON:  None. FINDINGS: The lungs are well-aerated. Vascular congestion is noted. Minimal bilateral opacities may reflect atelectasis or possibly mild infection. There is no evidence of pleural effusion or pneumothorax. The heart is enlarged. No acute osseous abnormalities are seen. Chronic sclerosis is seen at the humeral heads bilaterally. IMPRESSION: Vascular congestion and cardiomegaly noted. Minimal bilateral opacities may reflect atelectasis or possibly mild infection. Electronically Signed   By: Garald Balding M.D.   On: 06/01/2016 00:03   Dg Knee Left Port  Result Date: 06/01/2016 CLINICAL DATA:  Patient status post debridement of the left knee with drain placement. EXAM: PORTABLE LEFT KNEE - 1-2 VIEW COMPARISON:  None. FINDINGS: Drainage catheter projects over the anterior knee. Tricompartmental osteoarthritis. Normal anatomic alignment. No evidence for acute fracture. IMPRESSION: Drain projects over the anterior knee soft tissues. Tricompartmental osteoarthritis. Electronically Signed   By: Lovey Newcomer M.D.   On: 06/01/2016 10:05   Dg Foot Complete Right  Result Date: 06/01/2016 CLINICAL DATA:  Acute onset of right dorsal foot pain and swelling. Initial encounter. EXAM: RIGHT FOOT COMPLETE - 3+ VIEW COMPARISON:  None. FINDINGS: There is no evidence of fracture or dislocation. Degenerative change is noted at the medial aspect of the midfoot. There is no evidence of talar subluxation; the subtalar joint is unremarkable in appearance. A plantar  calcaneal spur is seen. No significant soft tissue abnormalities are seen. IMPRESSION: 1. No evidence of fracture or dislocation. 2. Degenerative change at the medial aspect of the midfoot. Electronically Signed   By: Garald Balding M.D.   On: 06/01/2016 00:04   US Abdomen Limited Ruq  Result Date: 06/01/2016 CLINICAL DATA:  Acute onset of fever.  Initial encounter. EXAM: US ABDOMEN LIMITED - RIGHT UPPER QUADRANT COMPARISON:  None. FINDINGS: Gallbladder: Multiple layering stones are seen dependently within the gallbladder, measuring up to 9 mm in size. No gallbladder wall thickening or pericholecystic fluid is seen. No ultrasonographic Murphy's sign is elicited. Common bile duct: Diameter: 0.4 cm, within normal limits in caliber. Liver: No focal lesion identified. Within normal limits in parenchymal echogenicity. IMPRESSION: Cholelithiasis. Gallbladder otherwise unremarkable. No evidence for cholecystitis or obstruction. Electronically Signed   By: Garald Balding M.D.   On: 06/01/2016 01:19        Scheduled Meds: . cefTRIAXone (ROCEPHIN)  IV  2 g Intravenous Q24H  . enoxaparin (LOVENOX) injection  40 mg Subcutaneous Daily  . ferrous sulfate  325 mg Oral TID PC  . vancomycin  1,000 mg Intravenous Q12H   Continuous Infusions: . sodium chloride 50 mL/hr at 06/02/16 1056     LOS: 1 day    Time spent: 3min    Domenic Polite, MD [facility] Pager [phone]  If 7PM-7AM, please contact night-coverage [email or web address] Password [facility] 06/02/2016, 11:42 AM

## 2016-06-02 NOTE — Progress Notes (Signed)
Physical Therapy Treatment Patient Details Name: Angie Wright MRN: [id number] DOB: 12/28/1955 Today's Date: 06/02/2016    History of Present Illness Pt is a 60 y/o female who had 24-48 hr history of progressive knee pain, swelling, difficulty weight bearing and moving the knee. She ultimately came to the emergency room and was diagnosed with a septic knee. She has felt intermittently nauseated. Pt with PMH of anemia. Pt has sickle cell trait and notes that her HgB tends to stay at around 7-8    PT Comments    Patient limited by N/V, dizziness, and pain. RN gave IV nausea medication during session. Continue to progress as tolerated.   Follow Up Recommendations  Home health PT;Supervision/Assistance - 24 hour     Equipment Recommendations  Rolling walker with 5" wheels;3in1 (PT)    Recommendations for Other Services       Precautions / Restrictions Precautions Precautions: Fall Restrictions Weight Bearing Restrictions: Yes LLE Weight Bearing: Weight bearing as tolerated    Mobility  Bed Mobility Overal bed mobility: Needs Assistance Bed Mobility: Supine to Sit;Sit to Supine     Supine to sit: Mod assist;HOB elevated;+2 for physical assistance Sit to supine: Mod assist;+2 for physical assistance   General bed mobility comments: cues for technique; assist to bring L LE to EOB and to elevate into bed as well as elevate trunk into sitting and scoot L hip to EOB with use of bed pad  Transfers                 General transfer comment: deferred due to dizziness, N/V, and pain level  Ambulation/Gait                 Stairs            Wheelchair Mobility    Modified Rankin (Stroke Patients Only)       Balance Overall balance assessment: Needs assistance Sitting-balance support: Bilateral upper extremity supported;Feet supported Sitting balance-Leahy Scale: Poor   Postural control: Posterior lean Standing balance support: Bilateral upper extremity  supported;During functional activity Standing balance-Leahy Scale: Poor Standing balance comment: Pt reliant on BUE support to maintain static standing balance                    Cognition Arousal/Alertness: Awake/alert Behavior During Therapy: WFL for tasks assessed/performed Overall Cognitive Status: Within Functional Limits for tasks assessed                      Exercises Total Joint Exercises Knee Flexion: AROM;Left;Seated;5 reps    General Comments        Pertinent Vitals/Pain Pain Assessment: Faces Faces Pain Scale: Hurts whole lot Pain Location: L knee and top of R foot Pain Descriptors / Indicators: Grimacing;Guarding;Sore;Sharp Pain Intervention(s): Limited activity within patient's tolerance;Monitored during session;Premedicated before session;Repositioned    Home Living                      Prior Function            PT Goals (current goals can now be found in the care plan section) Acute Rehab PT Goals Patient Stated Goal: feel better Progress towards PT goals: Not progressing toward goals - comment (N/V and dizziness)    Frequency    Min 5X/week      PT Plan Current plan remains appropriate    Co-evaluation             End of  Session Equipment Utilized During Treatment: Gait belt Activity Tolerance: Patient limited by pain (N/V) Patient left: with call bell/phone within reach;in bed;with nursing/sitter in room;with family/visitor present     Time: [id number] PT Time Calculation (min) (ACUTE ONLY): 32 min  Charges:  $Therapeutic Activity: 23-37 mins                    G Codes:      Salina April, PTA Pager: [phone]   06/02/2016, 1:33 PM

## 2016-06-03 LAB — COMPREHENSIVE METABOLIC PANEL
ALBUMIN: 2.1 g/dL — AB (ref 3.5–5.0)
ALK PHOS: 106 U/L (ref 38–126)
ALT: 21 U/L (ref 14–54)
AST: 22 U/L (ref 15–41)
Anion gap: 10 (ref 5–15)
BUN: 6 mg/dL (ref 6–20)
CHLORIDE: 105 mmol/L (ref 101–111)
CO2: 24 mmol/L (ref 22–32)
CREATININE: 0.73 mg/dL (ref 0.44–1.00)
Calcium: 8.9 mg/dL (ref 8.9–10.3)
GFR calc non Af Amer: >60 mL/min (ref >60)
GLUCOSE: 112 mg/dL — AB (ref 65–99)
Potassium: 2.8 mmol/L — ABNORMAL LOW (ref 3.5–5.1)
SODIUM: 139 mmol/L (ref 135–145)
Total Bilirubin: 2.6 mg/dL — ABNORMAL HIGH (ref 0.3–1.2)
Total Protein: 6.1 g/dL — ABNORMAL LOW (ref 6.5–8.1)

## 2016-06-03 MED ORDER — POLYETHYLENE GLYCOL 3350 17 G PO PACK
17.0000 g | PACK | Freq: Every day | ORAL | Status: DC
  Administered 2016-06-03 – 2016-06-05 (×3): 17 g via ORAL
  Filled 2016-06-03 (×3): qty 1

## 2016-06-03 MED ORDER — SENNOSIDES-DOCUSATE SODIUM 8.6-50 MG PO TABS
1.0000 | ORAL_TABLET | Freq: Two times a day (BID) | ORAL | Status: DC
  Administered 2016-06-03 – 2016-06-05 (×5): 1 via ORAL
  Filled 2016-06-03 (×5): qty 1

## 2016-06-03 MED ORDER — NAPROXEN 250 MG PO TABS
375.0000 mg | ORAL_TABLET | Freq: Two times a day (BID) | ORAL | Status: DC
  Administered 2016-06-03 – 2016-06-05 (×5): 375 mg via ORAL
  Filled 2016-06-03 (×5): qty 2

## 2016-06-03 MED ORDER — POTASSIUM CHLORIDE CRYS ER 20 MEQ PO TBCR
40.0000 meq | EXTENDED_RELEASE_TABLET | Freq: Once | ORAL | Status: AC
  Administered 2016-06-03: 40 meq via ORAL
  Filled 2016-06-03: qty 2

## 2016-06-03 NOTE — Progress Notes (Signed)
    Subjective: 2 Days Post-Op Procedure(s) (LRB): ARTHROSCOPIC WASHOUT LEFT KNEE (Left) Patient reports pain as 3 on 0-10 scale.   Denies CP or SOB.  Voiding without difficulty. Positive flatus. Objective: Vital signs in last 24 hours: Temp:  [98.3 F (36.8 C)-100.8 F (38.2 C)] 98.3 F (36.8 C) (12/30 0520) Pulse Rate:  [77-86] 77 (12/30 0520) Resp:  [16] 16 (12/30 0520) BP: (130-137)/(57-61) 130/57 (12/30 0520) SpO2:  [97 %-100 %] 97 % (12/30 0520)  Intake/Output from previous day: 12/29 0701 - 12/30 0700 In: 370 [P.O.:360; I.V.:10] Out: 620 [Urine:600; Drains:20] Intake/Output this shift: Total I/O In: 240 [P.O.:240] Out: -   Labs:  Recent Labs  05/31/16 1926 06/01/16 0829 06/02/16 0538  HGB 7.4* 6.2* 8.1*    Recent Labs  06/01/16 0829 06/01/16 0900 06/02/16 0538  WBC 34.7*  --  27.7*  RBC 2.68* 2.67* 3.26*  HCT 18.4*  --  23.0*  PLT 205  --  192    Recent Labs  06/02/16 0538 06/03/16 0418  NA 143 139  K 3.1* 2.8*  CL 111 105  CO2 25 24  BUN 12 6  CREATININE 0.81 0.73  GLUCOSE 118* 112*  CALCIUM 8.8* 8.9   No results for input(s): LABPT, INR in the last 72 hours.  Physical Exam: Neurologically intact ABD soft Intact pulses distally Left knee dressing removed, incisions c/d/i, knee effusion present likely 2/2 fluid burden of surgery, pain with extremes of motion at knee, but not with gentle motion No cellulitis present Compartment soft right foot swelling and pain.  Difficulty applying weight to the foot  Assessment/Plan: 2 Days Post-Op Procedure(s) (LRB): ARTHROSCOPIC WASHOUT LEFT KNEE (Left) Advance diet Up with therapy  1. MRI of right foot shows no infection, mild midfoot OA 2. Dressing changed, daily dry  Dressing needed 3. Awake final cultures and definitive abx management - per ID team  Nicholes Stairs for Dr. Melina Schools [facility] [phone] 06/03/2016, 10:50 AM

## 2016-06-03 NOTE — Progress Notes (Signed)
PROGRESS NOTE    Angie Wright  [id number] DOB: 23-Oct-1955 DOA: 05/31/2016 PCP: No PCP Per Patient  Brief Narrative: Angie Wright is a 60 y.o. female with medical history significant of Anemia (baseline HGB is "between 7 and 8 always at the doctors office" she says), this due to "sickle cell trait" she states.  Patient presents to the ED with c/o fever, bodyaches, L knee pain and swelling  Assessment & Plan:   Principal Problem:   Septic arthritis of knee, left (HCC) -S/p Arthroscopic I & D of left knee.12/28 by Dr.Brooks -Day 3 of IV Vanc and ceftriaxone -synovial fluid Cx with GPC in pairs and rare GNR-final results pending -Blood Cx-NGTD -appreciate ID consult per Dr.Snider, ordered ECHO -PICC line placed    R ankle pain -no joint effusion/infection, mils OA and soft tissue swelling -also has mild L shoulder pain-will add naproxen, ? Migratory polyarthritis    Severe Iron deficiency anemia -chronic, with baseline of 7-8gm/dl -Hb down to 6.2, transfused 2units PRBC 12/28 -FU Hb electrophoresis, h/o [place] trait  DVT prophylaxis:Lovenox Code Status:Full COde Family Communication spouse at bedside Disposition Plan:Home in few days   Consultants:   Ortho   Procedures:Arthroscopic I and D of left knee.12/28  Antimicrobials: Vancomycin 12/27 Subjective: Some L knee pain, some R ankle pain, mild L shoulder pain  Objective: Vitals:   06/02/16 1342 06/02/16 2031 06/03/16 0017 06/03/16 0520  BP:  (!) 137/57  (!) 130/57  Pulse:  86  77  Resp:    16  Temp: 99.7 F (37.6 C) 98.6 F (37 C) (!) 100.8 F (38.2 C) 98.3 F (36.8 C)  TempSrc: Axillary Oral  Oral  SpO2:  98%  97%  Weight:      Height:        Intake/Output Summary (Last 24 hours) at 06/03/16 1149 Last data filed at 06/03/16 0842  Gross per 24 hour  Intake              490 ml  Output                0 ml  Net              490 ml   Filed Weights   06/01/16 0108  Weight: 78 kg (172 lb)     Examination:  General exam: Appears calm and comfortable, AAOx3 Respiratory system: Clear to auscultation. Respiratory effort normal. Cardiovascular system: S1 & S2 heard, RRR. No JVD, murmurs, rubs, gallops or clicks.  Gastrointestinal system: Abdomen is nondistended, soft and nontender. Normal bowel sounds heard. Central nervous system: Alert and oriented. No focal neurological deficits. Extremities: L knee with dressing, mild R ankle swelling and L shoulder painful RoM Skin: No rashes, lesions or ulcers Psychiatry: Judgement and insight appear normal. Mood & affect appropriate.     Data Reviewed: I have personally reviewed following labs and imaging studies  CBC:  Recent Labs Lab 05/31/16 1926 06/01/16 0829 06/02/16 0538  WBC 34.1* 34.7* 27.7*  NEUTROABS 33.1*  --   --   HGB 7.4* 6.2* 8.1*  HCT 21.9* 18.4* 23.0*  MCV 67.8* 68.7* 70.6*  PLT 256 205 [id number]   Basic Metabolic Panel:  Recent Labs Lab 05/31/16 1926 06/01/16 0829 06/01/16 0900 06/02/16 0538 06/03/16 0418  NA 142  --  145 143 139  K 2.8*  --  3.3* 3.1* 2.8*  CL 110  --  116* 111 105  CO2 23  --  24 25 24  GLUCOSE 186*  --  154* 118* 112*  BUN 15  --  12 12 6   CREATININE 0.67 0.68 0.63 0.81 0.73  CALCIUM 8.9  --  8.3* 8.8* 8.9  MG 2.1  --   --   --   --    GFR: Estimated Creatinine Clearance: 77.9 mL/min (by C-G formula based on SCr of 0.73 mg/dL). Liver Function Tests:  Recent Labs Lab 05/31/16 1926 06/02/16 0538 06/03/16 0418  AST 39 20 22  ALT 27 18 21   ALKPHOS 78 75 106  BILITOT 4.9* 2.8* 2.6*  PROT 7.4 6.4* 6.1*  ALBUMIN 3.9 2.7* 2.1*    Recent Labs Lab 05/31/16 1926  LIPASE 19   No results for input(s): AMMONIA in the last 168 hours. Coagulation Profile: No results for input(s): INR, PROTIME in the last 168 hours. Cardiac Enzymes:  Recent Labs Lab 05/31/16 1926  CKTOTAL 112   BNP (last 3 results) No results for input(s): PROBNP in the last 8760 hours. HbA1C: No  results for input(s): HGBA1C in the last 72 hours. CBG: No results for input(s): GLUCAP in the last 168 hours. Lipid Profile: No results for input(s): CHOL, HDL, LDLCALC, TRIG, CHOLHDL, LDLDIRECT in the last 72 hours. Thyroid Function Tests: No results for input(s): TSH, T4TOTAL, FREET4, T3FREE, THYROIDAB in the last 72 hours. Anemia Panel:  Recent Labs  06/01/16 0900 06/01/16 0930  VITAMINB12 1,670*  --   FOLATE  --  15.8  FERRITIN 321*  --   TIBC 220*  --   IRON 8*  --   RETICCTPCT 4.8*  --    Urine analysis:    Component Value Date/Time   COLORURINE YELLOW 06/01/2016 0257   APPEARANCEUR HAZY (A) 06/01/2016 0257   LABSPEC 1.012 06/01/2016 0257   PHURINE 5.0 06/01/2016 0257   GLUCOSEU 50 (A) 06/01/2016 0257   HGBUR MODERATE (A) 06/01/2016 0257   BILIRUBINUR NEGATIVE 06/01/2016 0257   KETONESUR NEGATIVE 06/01/2016 0257   PROTEINUR >=300 (A) 06/01/2016 0257   NITRITE NEGATIVE 06/01/2016 0257   LEUKOCYTESUR MODERATE (A) 06/01/2016 0257   Sepsis Labs: @LABRCNTIP (procalcitonin:4,lacticidven:4)  ) Recent Results (from the past 240 hour(s))  Body fluid culture     Status: None (Preliminary result)   Collection Time: 05/31/16 12:43 AM  Result Value Ref Range Status   Specimen Description KNEE LEFT  Final   Special Requests NONE  Final   Gram Stain   Final    WBC PRESENT, PREDOMINANTLY PMN GRAM POSITIVE COCCI IN PAIRS CYTOSPIN SMEAR Gram Stain Report Called to,Read Back By and Verified With: L.ADKINS AT 0201 ON 06/01/16 BY W.SHEA Performed at [facility]  Final   Report Status PENDING  Incomplete  Surgical pcr screen     Status: None   Collection Time: 06/01/16  5:16 AM  Result Value Ref Range Status   MRSA, PCR NEGATIVE NEGATIVE Final   Staphylococcus aureus NEGATIVE NEGATIVE Final    Comment:        The Xpert SA Assay (FDA approved for NASAL specimens in patients over 72 years of age), is one component of a  comprehensive surveillance program.  Test performance has been validated by [facility] for patients greater than or equal to 50 year old. It is not intended to diagnose infection nor to guide or monitor treatment.   Aerobic/Anaerobic Culture (surgical/deep wound)     Status: None (Preliminary result)   Collection Time: 06/01/16  7:06 AM  Result Value  Ref Range Status   Specimen Description ABSCESS LEFT KNEE  Final   Special Requests NONE  Final   Gram Stain   Final    ABUNDANT WBC PRESENT, PREDOMINANTLY PMN FEW GRAM POSITIVE COCCI IN PAIRS RARE GRAM NEGATIVE RODS    Culture CULTURE REINCUBATED FOR BETTER GROWTH  Final   Report Status PENDING  Incomplete  Culture, blood (routine x 2)     Status: None (Preliminary result)   Collection Time: 06/01/16 10:52 AM  Result Value Ref Range Status   Specimen Description BLOOD LEFT HAND  Final   Special Requests IN PEDIATRIC BOTTLE  1CC  Final   Culture NO GROWTH 2 DAYS  Final   Report Status PENDING  Incomplete  Culture, blood (routine x 2)     Status: None (Preliminary result)   Collection Time: 06/01/16 10:57 AM  Result Value Ref Range Status   Specimen Description BLOOD LEFT HAND  Final   Special Requests IN PEDIATRIC BOTTLE 1CC  Final   Culture NO GROWTH 2 DAYS  Final   Report Status PENDING  Incomplete         Radiology Studies: Mr Foot Right W Wo Contrast  Result Date: 06/02/2016 CLINICAL DATA:  Right dorsal foot pain and swelling since Tuesday. No known injury. EXAM: MRI OF THE RIGHT FOREFOOT WITHOUT AND WITH CONTRAST TECHNIQUE: Multiplanar, multisequence MR imaging of the right foot was performed before and after the administration of intravenous contrast. CONTRAST:  39mL MULTIHANCE GADOBENATE DIMEGLUMINE 529 MG/ML IV SOLN COMPARISON:  05/31/2016 radiographs of the right foot FINDINGS: Bones/Joint/Cartilage Mild reactive marrow edema about the second TMT joint with joint space narrowing and subchondral cyst at the base of  the second metatarsal are consistent with osteoarthritis. There is marrow edema involving the proximal second metatarsal shaft with faint hypointense linear foci potentially representing a the subtle stress fracture. No findings of acute osteomyelitis. Sub chondral cystic change of the medial and mid cuneiform are noted. Mild hallux valgus. Calcaneal enthesophyte along the plantar aspect. No acute abnormality of the plantar fascia nor Achilles. Ligaments Nonacute Muscles and Tendons The tendons crossing the ankle joint appear intact. Soft tissues Mild diffuse soft tissue swelling along the plantar dorsal aspect of the foot. 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 Reactive marrow edema about the 2nd TMT joint involving the second metatarsal and middle cuneiform. Further distally within the proximal second metatarsal shaft is marrow edema with minimal faint linear hypointensity seen on some of the images suggesting a possible stress fracture. No findings of acute osteomyelitis. Diffuse mild nonspecific soft tissue edema of the foot, query cellulitis. Electronically Signed   By: Ashley Royalty M.D.   On: 06/02/2016 20:27        Scheduled Meds: . cefTRIAXone (ROCEPHIN)  IV  2 g Intravenous Q24H  . enoxaparin (LOVENOX) injection  40 mg Subcutaneous Daily  . ferrous sulfate  325 mg Oral TID PC  . naproxen  375 mg Oral BID WC  . polyethylene glycol  17 g Oral Daily  . senna-docusate  1 tablet Oral BID  . vancomycin  1,000 mg Intravenous Q12H   Continuous Infusions:    LOS: 2 days    Time spent: 31min    Domenic Polite, MD [facility] Pager [phone]  If 7PM-7AM, please contact night-coverage [email or web address] Password [email or web address] 06/03/2016, 11:49 AM

## 2016-06-03 NOTE — Progress Notes (Signed)
Cultures are still incubating showing polymicrobial on gram stain. Continue on current abtx regimen. Mri of foot did not suggest any deep structure abscess to require any debridement

## 2016-06-03 NOTE — Plan of Care (Signed)
Problem: Physical Regulation: Goal: Will remain free from infection Outcome: Progressing Medicated with Tylenol 650 mg po for temp. of 100.8, right foot remains swollen  Problem: Fluid Volume: Goal: Ability to maintain a balanced intake and output will improve Outcome: Not Progressing Denies S/S of DVT  Problem: Bowel/Gastric: Goal: Will not experience complications related to bowel motility Outcome: Progressing Denies bowel and gastric issues

## 2016-06-03 NOTE — Progress Notes (Signed)
OT Cancellation Note  Patient Details Name: Angie Wright MRN: [id number] DOB: 04-20-1956   Cancelled Treatment:    Reason Eval/Treat Not Completed: Patient declined, no reason specified. OT entered room, and Pt declined due to visitors, asking OT to come back later in the day. OT explained the importance of repositioning and educated on pneumonia prevention, and Pt still declined stating "I will do it tomorrow" Pt very polite and nice, but refused.  [place] 06/03/2016, 4:04 PM  Hulda Humphrey OTR/L [phone]

## 2016-06-04 ENCOUNTER — Inpatient Hospital Stay (HOSPITAL_COMMUNITY): Payer: BLUE CROSS/BLUE SHIELD

## 2016-06-04 DIAGNOSIS — R0600 Dyspnea, unspecified: Secondary | ICD-10-CM

## 2016-06-04 DIAGNOSIS — B953 Streptococcus pneumoniae as the cause of diseases classified elsewhere: Secondary | ICD-10-CM

## 2016-06-04 LAB — CBC
HCT: 22.1 % — ABNORMAL LOW (ref 36.0–46.0)
Hemoglobin: 7.6 g/dL — ABNORMAL LOW (ref 12.0–15.0)
MCH: 24.2 pg — AB (ref 26.0–34.0)
MCHC: 34.4 g/dL (ref 30.0–36.0)
MCV: 70.4 fL — ABNORMAL LOW (ref 78.0–100.0)
PLATELETS: 241 10*3/uL (ref 150–400)
RBC: 3.14 MIL/uL — ABNORMAL LOW (ref 3.87–5.11)
RDW: 17.3 % — AB (ref 11.5–15.5)
WBC: 17.3 10*3/uL — ABNORMAL HIGH (ref 4.0–10.5)

## 2016-06-04 LAB — BASIC METABOLIC PANEL
Anion gap: 6 (ref 5–15)
BUN: 7 mg/dL (ref 6–20)
CALCIUM: 9.1 mg/dL (ref 8.9–10.3)
CO2: 26 mmol/L (ref 22–32)
CREATININE: 0.66 mg/dL (ref 0.44–1.00)
Chloride: 107 mmol/L (ref 101–111)
GFR calc Af Amer: >60 mL/min (ref >60)
GFR calc non Af Amer: >60 mL/min (ref >60)
GLUCOSE: 113 mg/dL — AB (ref 65–99)
Potassium: 3.2 mmol/L — ABNORMAL LOW (ref 3.5–5.1)
Sodium: 139 mmol/L (ref 135–145)

## 2016-06-04 LAB — BODY FLUID CULTURE

## 2016-06-04 LAB — ECHOCARDIOGRAM COMPLETE
HEIGHTINCHES: 66 in
WEIGHTICAEL: 2752 [oz_av]

## 2016-06-04 LAB — PREPARE RBC (CROSSMATCH)

## 2016-06-04 LAB — HEPATITIS C ANTIBODY: HCV Ab: <0.1 s/co ratio (ref 0.0–0.9)

## 2016-06-04 LAB — HIV ANTIBODY (ROUTINE TESTING W REFLEX): HIV Screen 4th Generation wRfx: NONREACTIVE

## 2016-06-04 MED ORDER — SODIUM CHLORIDE 0.9 % IV SOLN: Freq: Once | INTRAVENOUS | Status: DC

## 2016-06-04 NOTE — Progress Notes (Signed)
Pharmacy Antibiotic Note  Angie Wright is a 60 y.o. female admitted on 05/31/2016 with septic arthritis.  Pharmacy has been consulted for Vancomycin dosing. Renal function is stable. Culture still pending.  Plan: Continue Vancomycin 1g IV q12 Check vancomycin trough on 1/1.  Height: 5\' 6"  (167.6 cm) Weight: 172 lb (78 kg) IBW/kg (Calculated) : 59.3  Temp (24hrs), Avg:98.4 F (36.9 C), Min:98.1 F (36.7 C), Max:98.6 F (37 C)   Recent Labs Lab 05/31/16 1926 06/01/16 0829 06/01/16 0900 06/02/16 0538 06/03/16 0418 06/04/16 0423  WBC 34.1* 34.7*  --  27.7*  --  17.3*  CREATININE 0.67 0.68 0.63 0.81 0.73 0.66    Estimated Creatinine Clearance: 77.9 mL/min (by C-G formula based on SCr of 0.66 mg/dL).    No Known Allergies    Thank you for allowing pharmacy to be a part of this patient's care.  Norva Riffle 06/04/2016 1:51 PM

## 2016-06-04 NOTE — Progress Notes (Signed)
  Echocardiogram 2D Echocardiogram has been performed.  Angie Wright 06/04/2016, 11:33 AM

## 2016-06-04 NOTE — Progress Notes (Addendum)
[facility] for Infectious Disease    Date of Admission:  05/31/2016   Total days of antibiotics 5        Day 5 ceftriaxone        Day 5 vanco           ID: Angie Wright is a 60 y.o. female with left knee septic arthritis, OR cultures growing strep pneumo Principal Problem:   Septic arthritis of knee, left (HCC) Active Problems:   Sepsis, Gram positive (HCC)   Anemia   Sickle cell trait (HCC)    Subjective: Afebrile, fever curve trending down. POD #3 from I x D. Leukocytosis slowly trending down. She is feeling better less joint pain. Less swelling in affected left knee  Medications:  . sodium chloride   Intravenous Once  . cefTRIAXone (ROCEPHIN)  IV  2 g Intravenous Q24H  . enoxaparin (LOVENOX) injection  40 mg Subcutaneous Daily  . ferrous sulfate  325 mg Oral TID PC  . naproxen  375 mg Oral BID WC  . polyethylene glycol  17 g Oral Daily  . senna-docusate  1 tablet Oral BID    Objective: Vital signs in last 24 hours: Temp:  [98.1 F (36.7 C)-98.6 F (37 C)] 98.1 F (36.7 C) (12/31 1330) Pulse Rate:  [72-82] 79 (12/31 1330) Resp:  [16] 16 (12/31 1330) BP: (121-144)/(48-63) 144/63 (12/31 1330) SpO2:  [98 %-100 %] 98 % (12/31 1330) Physical Exam  Constitutional:  oriented to person, place, and time. appears well-developed and well-nourished. No distress.  HENT: /AT, PERRLA, no scleral icterus Mouth/Throat: Oropharynx is clear and moist. No oropharyngeal exudate.  Cardiovascular: Normal rate, regular rhythm and normal heart sounds. Exam reveals no gallop and no friction rub.  No murmur heard.  Pulmonary/Chest: Effort normal and breath sounds normal. No respiratory distress.  has no wheezes.  Neck = supple, no nuchal rigidity Abdominal: Soft. Bowel sounds are normal.  exhibits no distension. There is no tenderness.  Lymphadenopathy: no cervical adenopathy. No axillary adenopathy Neurological: alert and oriented to person, place, and time.  Ext: left knee  has 2 areas with sutures, still some associated swelling.right foot less tender Psychiatric: a normal mood and affect.  behavior is normal.    Lab Results  Recent Labs  06/02/16 0538 06/03/16 0418 06/04/16 0423  WBC 27.7*  --  17.3*  HGB 8.1*  --  7.6*  HCT 23.0*  --  22.1*  NA 143 139 139  K 3.1* 2.8* 3.2*  CL 111 105 107  CO2 _0 BUN _1 CREATININE 0.81 0.73 0.66   Liver Panel  Recent Labs  06/02/16 0538 06/03/16 0418  PROT 6.4* 6.1*  ALBUMIN 2.7* 2.1*  AST 20 22  ALT 18 21  ALKPHOS 75 106  BILITOT 2.8* 2.6*   Sedimentation Rate  Recent Labs  06/02/16 0538  ESRSEDRATE 97*   C-Reactive Protein  Recent Labs  06/02/16 0538  CRP 31.4*    Microbiology: 12/27 synovial fluid cx + strep pneumo 12/28 OR CX + strep pneumo 12/28 blood cx NGTD Studies/Results: Mr Foot Right W Wo Contrast  Result Date: 06/02/2016 CLINICAL DATA:  Right dorsal foot pain and swelling since Tuesday. No known injury. EXAM: MRI OF THE RIGHT FOREFOOT WITHOUT AND WITH CONTRAST TECHNIQUE: Multiplanar, multisequence MR imaging of the right foot was performed before and after the administration of intravenous contrast. CONTRAST:  84m MULTIHANCE GADOBENATE DIMEGLUMINE 529 MG/ML IV SOLN COMPARISON:  05/31/2016  radiographs of the right foot FINDINGS: Bones/Joint/Cartilage Mild reactive marrow edema about the second TMT joint with joint space narrowing and subchondral cyst at the base of the second metatarsal are consistent with osteoarthritis. There is marrow edema involving the proximal second metatarsal shaft with faint hypointense linear foci potentially representing a the subtle stress fracture. No findings of acute osteomyelitis. Sub chondral cystic change of the medial and mid cuneiform are noted. Mild hallux valgus. Calcaneal enthesophyte along the plantar aspect. No acute abnormality of the plantar fascia nor Achilles. Ligaments Nonacute Muscles and Tendons The tendons crossing the  ankle joint appear intact. Soft tissues Mild diffuse soft tissue swelling along the plantar dorsal aspect of the foot. 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 Reactive marrow edema about the 2nd TMT joint involving the second metatarsal and middle cuneiform. Further distally within the proximal second metatarsal shaft is marrow edema with minimal faint linear hypointensity seen on some of the images suggesting a possible stress fracture. No findings of acute osteomyelitis. Diffuse mild nonspecific soft tissue edema of the foot, query cellulitis. Electronically Signed   By: Ashley Royalty M.D.   On: 06/02/2016 20:27     Assessment/Plan: Strep pneumonaie septic arthritis = plan to treat with ceftiraxone 2gm IV daily for 4 wk using 12/29 as day 1 (POD#1 since debridement via picc line. We will d/c vanco  - TTE suggested LAE but no other abn  - blood cx are negative but they were collected after being on abtx  Right foot swelling = possibly reactive arthritis  We will follow her up as an outpatient, home health orders listed below  Can likely go home tomorrow if still looks this good, have dose of abtx given here prior to discharge  Will sign off ----------------------------------- Diagnosis: Septic arthritis of knee  Culture Result: strep pneumonaie  No Known Allergies  Discharge antibiotics: Per pharmacy protocol  Ceftriaxone 2 gm iv daily  Duration: 4 wk End Date: Jan 25th  [place] Per Protocol:  Labs weekly while on IV antibiotics: _x_ CBC with differential _x_ BMP  _x_ CRP _x_ ESR  _x_ Please pull PIC at completion of IV antibiotics   Fax weekly labs to [phone]  Clinic Follow Up Appt: In 4 wk  @ [place] Infectious Diseases Cell: [phone] Pager: [phone]  06/04/2016, 3:24  PM

## 2016-06-04 NOTE — Plan of Care (Signed)
Problem: Bowel/Gastric: Goal: Will not experience complications related to bowel motility Outcome: Progressing No gastric or bowel issues noted, last BM on 05/29/2016, denies discomfort

## 2016-06-04 NOTE — Progress Notes (Signed)
PROGRESS NOTE    Angie Wright  [id number] DOB: 09-14-1955 DOA: 05/31/2016 PCP: No PCP Per Patient  Brief Narrative: Angie Wright is a 60 y.o. female with medical history significant of Anemia (baseline HGB is "between 7 and 8 always at the doctors office.  Patient presents to the ED with c/o fever, bodyaches, L knee pain and swelling  Assessment & Plan:   Principal Problem:   Septic arthritis of knee, left (HCC) -S/p Arthroscopic I & D of left knee.12/28 by Dr.Brooks -Day 4 of IV Vanc and ceftriaxone -synovial fluid Cx with rare strep pneumo -Blood Cx-NGTD -appreciate ID consult per Dr.Snider -ordered ECHO-pending -PICC line placed    R ankle pain -no joint effusion/infection, mils OA and soft tissue swelling -also has mild L shoulder pain- ? Migratory polyarthritis -continue naproxen, improving    Severe Iron deficiency anemia -chronic, with baseline of 7-8gm/dl -Hb down to 6.2, transfused 2units PRBC 12/28 -FU Hb electrophoresis, h/o  trait -will transfuse another unit today  DVT prophylaxis:Lovenox Code Status:Full COde Family Communication spouse at bedside Disposition Plan:Home in few days   Consultants:   Ortho   Procedures:Arthroscopic I and D of left knee.12/28  Antimicrobials: Vancomycin 12/27 Subjective: DOing better, some eye floaters, Some L knee pain, some R ankle pain, mild L shoulder pain  Objective: Vitals:   06/03/16 0520 06/03/16 1429 06/03/16 2121 06/04/16 0437  BP: (!) 130/57 (!) 149/69 (!) 129/53 (!) 121/48  Pulse: 77 96 82 72  Resp: 16 16 16 16   Temp: 98.3 F (36.8 C)  98.6 F (37 C) 98.6 F (37 C)  TempSrc: Oral  Oral Oral  SpO2: 97% 100% 100% 98%  Weight:      Height:        Intake/Output Summary (Last 24 hours) at 06/04/16 1115 Last data filed at 06/04/16 0800  Gross per 24 hour  Intake              720 ml  Output              600 ml  Net              120 ml   Filed Weights   06/01/16 0108  Weight: 78 kg (172  lb)    Examination:  General exam:  AAOx3, more comfortable today Respiratory system: Clear to auscultation. Respiratory effort normal. Cardiovascular system: S1 & S2 heard, RRR. No JVD, murmurs, rubs, gallops or clicks.  Gastrointestinal system: Abdomen is nondistended, soft and nontender. Normal bowel sounds heard. Central nervous system: Alert and oriented. No focal neurological deficits. Extremities: L knee with dressing, mild R ankle swelling and L shoulder painful RoM, L neck paraspinal ms tender Skin: No rashes, lesions or ulcers Psychiatry: Judgement and insight appear normal. Mood & affect appropriate.     Data Reviewed: I have personally reviewed following labs and imaging studies  CBC:  Recent Labs Lab 05/31/16 1926 06/01/16 0829 06/02/16 0538 06/04/16 0423  WBC 34.1* 34.7* 27.7* 17.3*  NEUTROABS 33.1*  --   --   --   HGB 7.4* 6.2* 8.1* 7.6*  HCT 21.9* 18.4* 23.0* 22.1*  MCV 67.8* 68.7* 70.6* 70.4*  PLT 256 205 192 [id number]   Basic Metabolic Panel:  Recent Labs Lab 05/31/16 1926 06/01/16 0829 06/01/16 0900 06/02/16 0538 06/03/16 0418 06/04/16 0423  NA 142  --  145 143 139 139  K 2.8*  --  3.3* 3.1* 2.8* 3.2*  CL 110  --  116* 111  105 107  CO2 23  --  24 25 24 26   GLUCOSE 186*  --  154* 118* 112* 113*  BUN 15  --  12 12 6 7   CREATININE 0.67 0.68 0.63 0.81 0.73 0.66  CALCIUM 8.9  --  8.3* 8.8* 8.9 9.1  MG 2.1  --   --   --   --   --    GFR: Estimated Creatinine Clearance: 77.9 mL/min (by C-G formula based on SCr of 0.66 mg/dL). Liver Function Tests:  Recent Labs Lab 05/31/16 1926 06/02/16 0538 06/03/16 0418  AST 39 20 22  ALT 27 18 21   ALKPHOS 78 75 106  BILITOT 4.9* 2.8* 2.6*  PROT 7.4 6.4* 6.1*  ALBUMIN 3.9 2.7* 2.1*    Recent Labs Lab 05/31/16 1926  LIPASE 19   No results for input(s): AMMONIA in the last 168 hours. Coagulation Profile: No results for input(s): INR, PROTIME in the last 168 hours. Cardiac Enzymes:  Recent Labs Lab  05/31/16 1926  CKTOTAL 112   BNP (last 3 results) No results for input(s): PROBNP in the last 8760 hours. HbA1C: No results for input(s): HGBA1C in the last 72 hours. CBG: No results for input(s): GLUCAP in the last 168 hours. Lipid Profile: No results for input(s): CHOL, HDL, LDLCALC, TRIG, CHOLHDL, LDLDIRECT in the last 72 hours. Thyroid Function Tests: No results for input(s): TSH, T4TOTAL, FREET4, T3FREE, THYROIDAB in the last 72 hours. Anemia Panel: No results for input(s): VITAMINB12, FOLATE, FERRITIN, TIBC, IRON, RETICCTPCT in the last 72 hours. Urine analysis:    Component Value Date/Time   COLORURINE YELLOW 06/01/2016 0257   APPEARANCEUR HAZY (A) 06/01/2016 0257   LABSPEC 1.012 06/01/2016 0257   PHURINE 5.0 06/01/2016 0257   GLUCOSEU 50 (A) 06/01/2016 0257   HGBUR MODERATE (A) 06/01/2016 0257   BILIRUBINUR NEGATIVE 06/01/2016 0257   KETONESUR NEGATIVE 06/01/2016 0257   PROTEINUR >=300 (A) 06/01/2016 0257   NITRITE NEGATIVE 06/01/2016 0257   LEUKOCYTESUR MODERATE (A) 06/01/2016 0257   Sepsis Labs: @LABRCNTIP (procalcitonin:4,lacticidven:4)  ) Recent Results (from the past 240 hour(s))  Body fluid culture     Status: None (Preliminary result)   Collection Time: 05/31/16 12:43 AM  Result Value Ref Range Status   Specimen Description KNEE LEFT  Final   Special Requests NONE  Final   Gram Stain   Final    WBC PRESENT, PREDOMINANTLY PMN GRAM POSITIVE COCCI IN PAIRS CYTOSPIN SMEAR Gram Stain Report Called to,Read Back By and Verified With: L.ADKINS AT 0201 ON 06/01/16 BY W.SHEA    Culture   Final    RARE STREPTOCOCCUS PNEUMONIAE SUSCEPTIBILITIES TO FOLLOW Performed at [facility]    Report Status PENDING  Incomplete  Surgical pcr screen     Status: None   Collection Time: 06/01/16  5:16 AM  Result Value Ref Range Status   MRSA, PCR NEGATIVE NEGATIVE Final   Staphylococcus aureus NEGATIVE NEGATIVE Final    Comment:        The Xpert SA Assay  (FDA approved for NASAL specimens in patients over 17 years of age), is one component of a comprehensive surveillance program.  Test performance has been validated by [facility] for patients greater than or equal to 53 year old. It is not intended to diagnose infection nor to guide or monitor treatment.   Aerobic/Anaerobic Culture (surgical/deep wound)     Status: None (Preliminary result)   Collection Time: 06/01/16  7:06 AM  Result Value Ref Range Status  Specimen Description ABSCESS LEFT KNEE  Final   Special Requests NONE  Final   Gram Stain   Final    ABUNDANT WBC PRESENT, PREDOMINANTLY PMN FEW GRAM POSITIVE COCCI IN PAIRS RARE GRAM NEGATIVE RODS    Culture   Final    RARE STREPTOCOCCUS PNEUMONIAE NO ANAEROBES ISOLATED; CULTURE IN PROGRESS FOR 5 DAYS    Report Status PENDING  Incomplete  Culture, blood (routine x 2)     Status: None (Preliminary result)   Collection Time: 06/01/16 10:52 AM  Result Value Ref Range Status   Specimen Description BLOOD LEFT HAND  Final   Special Requests IN PEDIATRIC BOTTLE  1CC  Final   Culture NO GROWTH 2 DAYS  Final   Report Status PENDING  Incomplete  Culture, blood (routine x 2)     Status: None (Preliminary result)   Collection Time: 06/01/16 10:57 AM  Result Value Ref Range Status   Specimen Description BLOOD LEFT HAND  Final   Special Requests IN PEDIATRIC BOTTLE 1CC  Final   Culture NO GROWTH 2 DAYS  Final   Report Status PENDING  Incomplete         Radiology Studies: Mr Foot Right W Wo Contrast  Result Date: 06/02/2016 CLINICAL DATA:  Right dorsal foot pain and swelling since Tuesday. No known injury. EXAM: MRI OF THE RIGHT FOREFOOT WITHOUT AND WITH CONTRAST TECHNIQUE: Multiplanar, multisequence MR imaging of the right foot was performed before and after the administration of intravenous contrast. CONTRAST:  44mL MULTIHANCE GADOBENATE DIMEGLUMINE 529 MG/ML IV SOLN COMPARISON:  05/31/2016 radiographs of the right foot  FINDINGS: Bones/Joint/Cartilage Mild reactive marrow edema about the second TMT joint with joint space narrowing and subchondral cyst at the base of the second metatarsal are consistent with osteoarthritis. There is marrow edema involving the proximal second metatarsal shaft with faint hypointense linear foci potentially representing a the subtle stress fracture. No findings of acute osteomyelitis. Sub chondral cystic change of the medial and mid cuneiform are noted. Mild hallux valgus. Calcaneal enthesophyte along the plantar aspect. No acute abnormality of the plantar fascia nor Achilles. Ligaments Nonacute Muscles and Tendons The tendons crossing the ankle joint appear intact. Soft tissues Mild diffuse soft tissue swelling along the plantar dorsal aspect of the foot. 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 Reactive marrow edema about the 2nd TMT joint involving the second metatarsal and middle cuneiform. Further distally within the proximal second metatarsal shaft is marrow edema with minimal faint linear hypointensity seen on some of the images suggesting a possible stress fracture. No findings of acute osteomyelitis. Diffuse mild nonspecific soft tissue edema of the foot, query cellulitis. Electronically Signed   By: Ashley Royalty M.D.   On: 06/02/2016 20:27        Scheduled Meds: . sodium chloride   Intravenous Once  . cefTRIAXone (ROCEPHIN)  IV  2 g Intravenous Q24H  . enoxaparin (LOVENOX) injection  40 mg Subcutaneous Daily  . ferrous sulfate  325 mg Oral TID PC  . naproxen  375 mg Oral BID WC  . polyethylene glycol  17 g Oral Daily  . senna-docusate  1 tablet Oral BID  . vancomycin  1,000 mg Intravenous Q12H   Continuous Infusions:    LOS: 3 days    Time spent: 68min    Domenic Polite, MD [facility] Pager [phone]  If 7PM-7AM, please contact  night-coverage [email or web address] Password [email or web address] 06/04/2016, 11:15 AM

## 2016-06-04 NOTE — Evaluation (Signed)
Occupational Therapy Evaluation Patient Details Name: Angie Wright MRN: [id number] DOB: [birth year] Today's Date: 06/04/2016    History of Present Illness Pt is a 60 y/o female who had 24-48 hr history of progressive knee pain, swelling, difficulty weight bearing and moving the knee. She ultimately came to the emergency room and was diagnosed with a septic knee. She has felt intermittently nauseated. Pt with PMH of anemia. Pt has sickle cell trait and notes that her HgB tends to stay at around 7-8. Pt also has R foot that is swelling and very painful   Clinical Impression   PTA Pt independent in ADL and mobility. Pt currently mod assist for LB ADL and +2 for mobility (mod +1 for stand pivot with RW). Pt able to perform seated grooming this session. Pt will benefit from skilled OT in the acute setting to maximize independence in ADL and safety as well as decrease caregiver burden. Next session to focus on tub transfer.    Follow Up Recommendations  No OT follow up;Supervision/Assistance - 24 hour    Equipment Recommendations  None recommended by OT (Pt has appropriate DME)    Recommendations for Other Services       Precautions / Restrictions Precautions Precautions: Fall Restrictions Weight Bearing Restrictions: Yes LLE Weight Bearing: Weight bearing as tolerated      Mobility Bed Mobility               General bed mobility comments: sitting EOB when OT entered the room  Transfers Overall transfer level: Needs assistance Equipment used: Rolling walker (2 wheeled) Transfers: Stand Pivot Transfers   Stand pivot transfers: Mod assist;+2 physical assistance       General transfer comment: Pt's husband acting as second assist    Balance Overall balance assessment: Needs assistance Sitting-balance support: No upper extremity supported;Feet supported Sitting balance-Leahy Scale: Fair   Postural control: Posterior lean Standing balance support: Bilateral upper  extremity supported;During functional activity Standing balance-Leahy Scale: Poor                              ADL Overall ADL's : Needs assistance/impaired Eating/Feeding: Modified independent;Sitting   Grooming: Wash/dry hands;Wash/dry face;Oral care;Sitting Grooming Details (indicate cue type and reason): in recliner Upper Body Bathing: Set up;Sitting   Lower Body Bathing: Minimal assistance;Sitting/lateral leans   Upper Body Dressing : Modified independent;Sitting   Lower Body Dressing: Moderate assistance;Sit to/from stand;With caregiver independent assisting   Toilet Transfer: Moderate assistance;Stand-pivot;BSC;RW Toilet Transfer Details (indicate cue type and reason): simulated with recliner           General ADL Comments: Pt's familiy very active and helpful     Vision Vision Assessment?: No apparent visual deficits   Perception     Praxis      Pertinent Vitals/Pain Pain Assessment: Faces Faces Pain Scale: Hurts whole lot Pain Location: L knee and top of R foot Pain Descriptors / Indicators: Grimacing;Guarding;Sore;Sharp Pain Intervention(s): Monitored during session;Repositioned     Hand Dominance Right   Extremity/Trunk Assessment Upper Extremity Assessment Upper Extremity Assessment: Overall WFL for tasks assessed   Lower Extremity Assessment Lower Extremity Assessment: RLE deficits/detail;LLE deficits/detail RLE Deficits / Details: painful to touch. painful with WB. LLE Deficits / Details: limited strength and ROM as expected post-operatively. Painful with weightbearing   Cervical / Trunk Assessment Cervical / Trunk Assessment: Normal   Communication Communication Communication: No difficulties   Cognition Arousal/Alertness: Awake/alert Behavior During Therapy:  WFL for tasks assessed/performed Overall Cognitive Status: Within Functional Limits for tasks assessed                     General Comments       Exercises        Shoulder Instructions      Home Living Family/patient expects to be discharged to:: Private residence Living Arrangements: Spouse/significant other Available Help at Discharge: Family (husband and daughter) Type of Home: House Home Access: Stairs to enter Technical brewer of Steps: 3 Entrance Stairs-Rails: Right Home Layout: Multi-level Alternate Level Stairs-Number of Steps: full flight (3 steps into bedroom (rail on right) flight otherwise) Alternate Level Stairs-Rails: Right Bathroom Shower/Tub: Tub/shower unit Shower/tub characteristics: Architectural technologist: Handicapped height Bathroom Accessibility: Yes How Accessible: Accessible via walker Home Equipment: [place] - single point;Bedside commode;Walker - 2 wheels   Additional Comments: home equipment from father in law who passed      Prior Functioning/Environment Level of Independence: Independent        Comments: driving, working, indpendent in ADL/IADL        OT Problem List: Decreased strength;Decreased range of motion;Decreased activity tolerance;Impaired balance (sitting and/or standing);Decreased knowledge of use of DME or AE;Pain   OT Treatment/Interventions: Self-care/ADL training;DME and/or AE instruction;Patient/family education;Balance training    OT Goals(Current goals can be found in the care plan section) Acute Rehab OT Goals Patient Stated Goal: feel better OT Goal Formulation: With patient Time For Goal Achievement: 06/11/16 Potential to Achieve Goals: Good ADL Goals Pt Will Transfer to Toilet: with supervision;stand pivot transfer;bedside commode Pt Will Perform Toileting - Clothing Manipulation and hygiene: with modified independence;sit to/from stand Pt Will Perform Tub/Shower Transfer: Tub transfer;ambulating;3 in 1;rolling walker  OT Frequency: Min 2X/week   Barriers to D/C:            Co-evaluation              End of Session Equipment Utilized During Treatment:  Gait belt;Rolling walker Nurse Communication: Mobility status  Activity Tolerance: Patient tolerated treatment well Patient left: in chair;with call bell/phone within reach;with family/visitor present   Time: [id number] OT Time Calculation (min): 35 min Charges:  OT General Charges $OT Visit: 1 Procedure OT Evaluation $OT Eval Moderate Complexity: 1 Procedure OT Treatments $Self Care/Home Management : 8-22 mins G-Codes:    Merri Ray Zorianna Taliaferro 06/05/2016, 2:14 PM Hulda Humphrey OTR/L [phone]

## 2016-06-04 NOTE — Progress Notes (Signed)
Angie Wright  MRN: [id number] DOB/Age: [id number] 61 y.o. Physician: Rolena Infante Procedure: Procedure(s) (LRB): ARTHROSCOPIC WASHOUT LEFT KNEE (Left)     Subjective: Right foot continues to feel better. Left knee with moderate pain  Vital Signs Temp:  [98.6 F (37 C)] 98.6 F (37 C) (12/31 0437) Pulse Rate:  [72-96] 72 (12/31 0437) Resp:  [16] 16 (12/31 0437) BP: (121-149)/(48-69) 121/48 (12/31 0437) SpO2:  [98 %-100 %] 98 % (12/31 0437)  Lab Results  Recent Labs  06/02/16 0538 06/04/16 0423  WBC 27.7* 17.3*  HGB 8.1* 7.6*  HCT 23.0* 22.1*  PLT 192 241   BMET  Recent Labs  06/03/16 0418 06/04/16 0423  NA 139 139  K 2.8* 3.2*  CL 105 107  CO2 24 26  GLUCOSE 112* 113*  BUN 6 7  CREATININE 0.73 0.66  CALCIUM 8.9 9.1   No results found for: INR   Exam  Left knee with moderate effusion and poor quad tone Neg. Homans and no drainage Right foot with decreased pain and swelling. No erythema        Plan Continue current treatment plan Dr. Rolena Infante to return tomorrow  [facility] PA-C  for Dr.Kevin Supple 06/04/2016, 10:09 AM Contact # [phone]

## 2016-06-05 ENCOUNTER — Encounter (HOSPITAL_COMMUNITY): Payer: Self-pay

## 2016-06-05 DIAGNOSIS — M79671 Pain in right foot: Secondary | ICD-10-CM

## 2016-06-05 DIAGNOSIS — M00262 Other streptococcal arthritis, left knee: Secondary | ICD-10-CM

## 2016-06-05 DIAGNOSIS — A419 Sepsis, unspecified organism: Secondary | ICD-10-CM

## 2016-06-05 DIAGNOSIS — R509 Fever, unspecified: Secondary | ICD-10-CM

## 2016-06-05 LAB — BASIC METABOLIC PANEL
ANION GAP: 11 (ref 5–15)
BUN: 6 mg/dL (ref 6–20)
CALCIUM: 9.4 mg/dL (ref 8.9–10.3)
CO2: 24 mmol/L (ref 22–32)
Chloride: 104 mmol/L (ref 101–111)
Creatinine, Ser: 0.59 mg/dL (ref 0.44–1.00)
GLUCOSE: 110 mg/dL — AB (ref 65–99)
Potassium: 2.8 mmol/L — ABNORMAL LOW (ref 3.5–5.1)
Sodium: 139 mmol/L (ref 135–145)

## 2016-06-05 LAB — TYPE AND SCREEN
ABO/RH(D): O POS
Antibody Screen: NEGATIVE
UNIT DIVISION: 0
Unit division: 0
Unit division: 0

## 2016-06-05 LAB — CBC
HCT: 26.9 % — ABNORMAL LOW (ref 36.0–46.0)
Hemoglobin: 9.1 g/dL — ABNORMAL LOW (ref 12.0–15.0)
MCH: 24.7 pg — ABNORMAL LOW (ref 26.0–34.0)
MCHC: 33.8 g/dL (ref 30.0–36.0)
MCV: 73.1 fL — ABNORMAL LOW (ref 78.0–100.0)
PLATELETS: 347 10*3/uL (ref 150–400)
RBC: 3.68 MIL/uL — ABNORMAL LOW (ref 3.87–5.11)
RDW: 18.7 % — AB (ref 11.5–15.5)
WBC: 17.8 10*3/uL — ABNORMAL HIGH (ref 4.0–10.5)

## 2016-06-05 MED ORDER — HYDROCODONE-ACETAMINOPHEN 7.5-325 MG PO TABS: 1.0000 | ORAL_TABLET | Freq: Four times a day (QID) | ORAL | 0 refills | Status: DC | PRN

## 2016-06-05 MED ORDER — POTASSIUM CHLORIDE CRYS ER 10 MEQ PO TBCR
EXTENDED_RELEASE_TABLET | ORAL | Status: AC
  Filled 2016-06-05: qty 2

## 2016-06-05 MED ORDER — NAPROXEN 375 MG PO TABS: 375.0000 mg | ORAL_TABLET | Freq: Two times a day (BID) | ORAL | 0 refills | Status: DC

## 2016-06-05 MED ORDER — POTASSIUM CHLORIDE CRYS ER 20 MEQ PO TBCR: 20.0000 meq | EXTENDED_RELEASE_TABLET | Freq: Every day | ORAL | 0 refills | Status: DC

## 2016-06-05 MED ORDER — POTASSIUM CHLORIDE CRYS ER 20 MEQ PO TBCR
80.0000 meq | EXTENDED_RELEASE_TABLET | Freq: Once | ORAL | Status: AC
  Administered 2016-06-05: 80 meq via ORAL
  Filled 2016-06-05: qty 4

## 2016-06-05 MED ORDER — POLYETHYLENE GLYCOL 3350 17 G PO PACK: 17.0000 g | PACK | Freq: Every day | ORAL | 0 refills | Status: DC | PRN

## 2016-06-05 MED ORDER — FERROUS SULFATE 325 (65 FE) MG PO TABS: 325.0000 mg | ORAL_TABLET | Freq: Two times a day (BID) | ORAL | 0 refills | Status: DC

## 2016-06-05 MED ORDER — DEXTROSE 5 % IV SOLN: 2.0000 g | INTRAVENOUS | 0 refills | Status: DC

## 2016-06-05 MED ORDER — HEPARIN SOD (PORK) LOCK FLUSH 100 UNIT/ML IV SOLN
250.0000 [IU] | INTRAVENOUS | Status: AC | PRN
  Administered 2016-06-05: 250 [IU]

## 2016-06-05 NOTE — Discharge Summary (Signed)
Physician Discharge Summary  Angie Wright [id number] DOB: 03-09-1956 DOA: 05/31/2016  PCP: No PCP Per Patient  Admit date: 05/31/2016 Discharge date: 06/05/2016  Time spent: 45 minutes  Recommendations for Outpatient Follow-up:  PCP in 1 week, please check CBC and Bmet-potassium in 1 week ID Dr.Snider in 3weeks Continue IV ceftriaxone till 1/26, DC PICC line after Abx course completed  Discharge Diagnoses:  Principal Problem:   Septic arthritis of knee, left (HCC)   Sepsis, strep pneumoniae   Iron deficiency Anemia   H/o Sickle cell trait ([place])   Discharge Condition: stable  Diet recommendation: regular  Filed Weights   06/01/16 0108  Weight: 78 kg (172 lb)    History of present illness:  Angie Wright a 61 y.o.femalewith medical history significant of Anemia (baseline HGB is "between 7 and 8 always at the doctors office. Patient presented to the ED with c/o fever, bodyaches, L knee pain and swelling   Hospital Course:  Septic arthritis of knee, left ([place]) -S/p Arthroscopic I & D of left knee.12/28 by Dr.Brooks -treated with 5days of IV Vanc and ceftriaxone, then transitioned to IV ceftriaxone -synovial fluid Cx grew strep pneumoniae -Blood Cx-NGTD, 2D ECHo unremarkable -ID consulted, see by Dr.Snider -PICC line placed, needs 4weeks of IV Ceftriaxone till 1/26, then DC PICC line -home health RN for home IV Abx/picc care arranged    R ankle pain -no joint effusion/infection, mild OA and soft tissue swelling -also has mild L shoulder pain- ? Migratory polyarthritis -much improved with NSAIDs, MRI of R ankle was unremarkable -continue naproxen, improving    Severe Iron deficiency anemia -chronic, with baseline of 7-8gm/dl -Hb down to 6.2, transfused 2units PRBC 12/28 and then another unit 12/30 -anemia panel with iron defi, no active bleeding, h/o colonoscopy within 5years unremarkable -give IV iron and started PO Iron at discharge -FU Hb  electrophoresis, h/o [place] trait-pending at discharge  Procedures:  Procedures:Arthroscopic I and D of left knee.12/28  Consultations:  ID  Ortho Dr.Brooks  Discharge Exam: Vitals:   06/04/16 2027 06/05/16 0450  BP: 133/65 136/61  Pulse: 79 78  Resp: 17 17  Temp: 98.4 F (36.9 C) 98.5 F (36.9 C)    General: AAOx3 Cardiovascular: S1S2/RRR Respiratory: CTAB  Discharge Instructions   Discharge Instructions    Diet general    Complete by:  As directed    Increase activity slowly    Complete by:  As directed      Current Discharge Medication List    START taking these medications   Details  cefTRIAXone 2 g in dextrose 5 % 50 mL Inject 2 g into the vein daily. 2gm IV Q24 hours, Continue IV ceftriaxone until 1/26 Qty: 25 each, Refills: 0    ferrous sulfate 325 (65 FE) MG tablet Take 1 tablet (325 mg total) by mouth 2 (two) times daily with a meal. Qty: 30 tablet, Refills: 0    HYDROcodone-acetaminophen (NORCO) 7.5-325 MG tablet Take 1 tablet by mouth every 6 (six) hours as needed for moderate pain. Qty: 30 tablet, Refills: 0    naproxen (NAPROSYN) 375 MG tablet Take 1 tablet (375 mg total) by mouth 2 (two) times daily with a meal. For 5days Qty: 10 tablet, Refills: 0    polyethylene glycol (MIRALAX / GLYCOLAX) packet Take 17 g by mouth daily as needed for moderate constipation. Qty: 6 each, Refills: 0    potassium chloride SA (K-DUR,KLOR-CON) 20 MEQ tablet Take 1 tablet (20 mEq total) by mouth  daily. Qty: 30 tablet, Refills: 0      CONTINUE these medications which have NOT CHANGED   Details  calcium carbonate (OS-CAL - DOSED IN MG OF ELEMENTAL CALCIUM) 1250 (500 Ca) MG tablet Take 1 tablet by mouth daily with breakfast.    cholecalciferol (VITAMIN D) 1000 units tablet Take 1,000 Units by mouth daily.    Multiple Vitamin (MULTIVITAMIN WITH MINERALS) TABS tablet Take 1 tablet by mouth daily.       No Known Allergies Follow-up Information    [facility] Follow up.   Why:  Someone from [place] will contact you to arrange start date and time for Nurse and Physical therapist.   Happy New Year! Contact information: [place] [address] [phone]        Dahlia Bailiff, MD. Schedule an appointment as soon as possible for a visit in 1 week(s).   Specialty:  Orthopedic Surgery Contact information: [address] [place] [address] [id number]        Moss Mc, MD Follow up.   Specialty:  Family Medicine Contact information: [place] [address] [phone]        Carlyle Basques, MD Follow up in 3 week(s).   Specialty:  Infectious Diseases Contact information: [place] [address] [phone]            The results of significant diagnostics from this hospitalization (including imaging, microbiology, ancillary and laboratory) are listed below for reference.    Significant Diagnostic Studies: Dg Chest 2 View  Result Date: 06/01/2016 CLINICAL DATA:  Acute onset of fever and disorientation. Initial encounter. EXAM: CHEST  2 VIEW COMPARISON:  None. FINDINGS: The lungs are well-aerated. Vascular congestion is noted. Minimal bilateral opacities may reflect atelectasis or possibly mild infection. There is no evidence of pleural effusion or pneumothorax. The heart is enlarged. No acute osseous abnormalities are seen. Chronic sclerosis is seen at the humeral heads bilaterally. IMPRESSION: Vascular congestion and cardiomegaly noted. Minimal bilateral opacities may reflect atelectasis or possibly mild infection. Electronically Signed   By: Garald Balding M.D.   On: 06/01/2016 00:03   Mr Foot Right W Wo Contrast  Result Date: 06/02/2016 CLINICAL DATA:  Right dorsal foot pain and swelling since Tuesday. No known injury. EXAM: MRI OF THE RIGHT FOREFOOT WITHOUT AND WITH CONTRAST TECHNIQUE: Multiplanar,  multisequence MR imaging of the right foot was performed before and after the administration of intravenous contrast. CONTRAST:  33mL MULTIHANCE GADOBENATE DIMEGLUMINE 529 MG/ML IV SOLN COMPARISON:  05/31/2016 radiographs of the right foot FINDINGS: Bones/Joint/Cartilage Mild reactive marrow edema about the second TMT joint with joint space narrowing and subchondral cyst at the base of the second metatarsal are consistent with osteoarthritis. There is marrow edema involving the proximal second metatarsal shaft with faint hypointense linear foci potentially representing a the subtle stress fracture. No findings of acute osteomyelitis. Sub chondral cystic change of the medial and mid cuneiform are noted. Mild hallux valgus. Calcaneal enthesophyte along the plantar aspect. No acute abnormality of the plantar fascia nor Achilles. Ligaments Nonacute Muscles and Tendons The tendons crossing the ankle joint appear intact. Soft tissues Mild diffuse soft tissue swelling along the plantar dorsal aspect of the foot. 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 Reactive marrow edema about the 2nd TMT joint involving the  second metatarsal and middle cuneiform. Further distally within the proximal second metatarsal shaft is marrow edema with minimal faint linear hypointensity seen on some of the images suggesting a possible stress fracture. No findings of acute osteomyelitis. Diffuse mild nonspecific soft tissue edema of the foot, query cellulitis. Electronically Signed   By: Ashley Royalty M.D.   On: 06/02/2016 20:27   Dg Knee Left Port  Result Date: 06/01/2016 CLINICAL DATA:  Patient status post debridement of the left knee with drain placement. EXAM: PORTABLE LEFT KNEE - 1-2 VIEW COMPARISON:  None. FINDINGS: Drainage catheter projects over the anterior knee. Tricompartmental osteoarthritis. Normal  anatomic alignment. No evidence for acute fracture. IMPRESSION: Drain projects over the anterior knee soft tissues. Tricompartmental osteoarthritis. Electronically Signed   By: Lovey Newcomer M.D.   On: 06/01/2016 10:05   Dg Foot Complete Right  Result Date: 06/01/2016 CLINICAL DATA:  Acute onset of right dorsal foot pain and swelling. Initial encounter. EXAM: RIGHT FOOT COMPLETE - 3+ VIEW COMPARISON:  None. FINDINGS: There is no evidence of fracture or dislocation. Degenerative change is noted at the medial aspect of the midfoot. There is no evidence of talar subluxation; the subtalar joint is unremarkable in appearance. A plantar calcaneal spur is seen. No significant soft tissue abnormalities are seen. IMPRESSION: 1. No evidence of fracture or dislocation. 2. Degenerative change at the medial aspect of the midfoot. Electronically Signed   By: Garald Balding M.D.   On: 06/01/2016 00:04   US Abdomen Limited Ruq  Result Date: 06/01/2016 CLINICAL DATA:  Acute onset of fever.  Initial encounter. EXAM: US ABDOMEN LIMITED - RIGHT UPPER QUADRANT COMPARISON:  None. FINDINGS: Gallbladder: Multiple layering stones are seen dependently within the gallbladder, measuring up to 9 mm in size. No gallbladder wall thickening or pericholecystic fluid is seen. No ultrasonographic Murphy's sign is elicited. Common bile duct: Diameter: 0.4 cm, within normal limits in caliber. Liver: No focal lesion identified. Within normal limits in parenchymal echogenicity. IMPRESSION: Cholelithiasis. Gallbladder otherwise unremarkable. No evidence for cholecystitis or obstruction. Electronically Signed   By: Garald Balding M.D.   On: 06/01/2016 01:19    Microbiology: Recent Results (from the past 240 hour(s))  Body fluid culture     Status: None   Collection Time: 05/31/16 12:43 AM  Result Value Ref Range Status   Specimen Description KNEE LEFT  Final   Special Requests NONE  Final   Gram Stain   Final    WBC PRESENT,  PREDOMINANTLY PMN GRAM POSITIVE COCCI IN PAIRS CYTOSPIN SMEAR Gram Stain Report Called to,Read Back By and Verified With: L.ADKINS AT 0201 ON 06/01/16 BY W.SHEA Performed at [facility]  Final   Report Status 06/04/2016 FINAL  Final   Organism ID, Bacteria STREPTOCOCCUS PNEUMONIAE  Final      Susceptibility   Streptococcus pneumoniae - MIC*    ERYTHROMYCIN <=0.12 SENSITIVE Sensitive     LEVOFLOXACIN 0.5 SENSITIVE Sensitive     PENICILLIN 0.25 INTERMEDIATE Intermediate     * RARE STREPTOCOCCUS PNEUMONIAE  Surgical pcr screen     Status: None   Collection Time: 06/01/16  5:16 AM  Result Value Ref Range Status   MRSA, PCR NEGATIVE NEGATIVE Final   Staphylococcus aureus NEGATIVE NEGATIVE Final    Comment:        The Xpert SA Assay (FDA approved for NASAL specimens in patients over 68 years of age), is one component of a comprehensive surveillance program.  Test performance has been validated by [facility] for patients greater than or equal to 7 year old. It is not intended to diagnose infection nor to guide or monitor treatment.   Aerobic/Anaerobic Culture (surgical/deep wound)     Status: None (Preliminary result)   Collection Time: 06/01/16  7:06 AM  Result Value Ref Range Status   Specimen Description ABSCESS LEFT KNEE  Final   Special Requests NONE  Final   Gram Stain   Final    ABUNDANT WBC PRESENT, PREDOMINANTLY PMN FEW GRAM POSITIVE COCCI IN PAIRS RARE GRAM NEGATIVE RODS    Culture   Final    RARE STREPTOCOCCUS PNEUMONIAE SUSCEPTIBILITIES TO FOLLOW NO ANAEROBES ISOLATED; CULTURE IN PROGRESS FOR 5 DAYS    Report Status PENDING  Incomplete  Culture, blood (routine x 2)     Status: None (Preliminary result)   Collection Time: 06/01/16 10:52 AM  Result Value Ref Range Status   Specimen Description BLOOD LEFT HAND  Final   Special Requests IN PEDIATRIC BOTTLE  1CC  Final   Culture NO GROWTH 3 DAYS  Final   Report  Status PENDING  Incomplete  Culture, blood (routine x 2)     Status: None (Preliminary result)   Collection Time: 06/01/16 10:57 AM  Result Value Ref Range Status   Specimen Description BLOOD LEFT HAND  Final   Special Requests IN PEDIATRIC BOTTLE 1CC  Final   Culture NO GROWTH 3 DAYS  Final   Report Status PENDING  Incomplete     Labs: Basic Metabolic Panel:  Recent Labs Lab 05/31/16 1926  06/01/16 0900 06/02/16 0538 06/03/16 0418 06/04/16 0423 06/05/16 0923  NA 142  --  145 143 139 139 139  K 2.8*  --  3.3* 3.1* 2.8* 3.2* 2.8*  CL 110  --  116* 111 105 107 104  CO2 23  --  24 25 24 26 24   GLUCOSE 186*  --  154* 118* 112* 113* 110*  BUN 15  --  12 12 6 7 6   CREATININE 0.67  < > 0.63 0.81 0.73 0.66 0.59  CALCIUM 8.9  --  8.3* 8.8* 8.9 9.1 9.4  MG 2.1  --   --   --   --   --   --   < > = values in this interval not displayed. Liver Function Tests:  Recent Labs Lab 05/31/16 1926 06/02/16 0538 06/03/16 0418  AST 39 20 22  ALT 27 18 21   ALKPHOS 78 75 106  BILITOT 4.9* 2.8* 2.6*  PROT 7.4 6.4* 6.1*  ALBUMIN 3.9 2.7* 2.1*    Recent Labs Lab 05/31/16 1926  LIPASE 19   No results for input(s): AMMONIA in the last 168 hours. CBC:  Recent Labs Lab 05/31/16 1926 06/01/16 0829 06/02/16 0538 06/04/16 0423 06/05/16 0923  WBC 34.1* 34.7* 27.7* 17.3* PENDING  NEUTROABS 33.1*  --   --   --   --   HGB 7.4* 6.2* 8.1* 7.6* 9.1*  HCT 21.9* 18.4* 23.0* 22.1* 26.9*  MCV 67.8* 68.7* 70.6* 70.4* 73.1*  PLT 256 205 192 241 PENDING   Cardiac Enzymes:  Recent Labs Lab 05/31/16 1926  CKTOTAL 112   BNP: BNP (last 3 results) No results for input(s): BNP in the last 8760 hours.  ProBNP (last 3 results) No results for input(s): PROBNP in the last 8760 hours.  CBG: No results for input(s): GLUCAP in the last 168 hours.     SignedDomenic Polite MD.  [facility] 06/05/2016, 10:49 AM

## 2016-06-05 NOTE — Care Management (Signed)
Patient is setup with [facility] for RN abd PT. , will discharge on IV ceftrioxone 2 grams IV daily for 4 weeks. Ricki Miller, RN BSN Case Manager [phone]

## 2016-06-05 NOTE — Progress Notes (Signed)
IV team notified to flush right upper arm PICC and cap for discharge home.

## 2016-06-05 NOTE — Progress Notes (Signed)
    Subjective: 4 Days Post-Op Procedure(s) (LRB): ARTHROSCOPIC WASHOUT LEFT KNEE (Left) Patient reports pain as 3 on 0-10 scale.   Denies CP or SOB.  Voiding without difficulty. Positive flatus. Objective: Vital signs in last 24 hours: Temp:  [97.3 F (36.3 C)-98.5 F (36.9 C)] 98.5 F (36.9 C) (01/01 0450) Pulse Rate:  [72-79] 78 (01/01 0450) Resp:  [16-18] 17 (01/01 0450) BP: (130-147)/(52-65) 136/61 (01/01 0450) SpO2:  [96 %-100 %] 96 % (01/01 0450)  Intake/Output from previous day: 12/31 0701 - 01/01 0700 In: 570 [P.O.:240; Blood:330] Out: -  Intake/Output this shift: No intake/output data recorded.  Labs:  Recent Labs  06/04/16 0423  HGB 7.6*    Recent Labs  06/04/16 0423  WBC 17.3*  RBC 3.14*  HCT 22.1*  PLT 241    Recent Labs  06/03/16 0418 06/04/16 0423  NA 139 139  K 2.8* 3.2*  CL 105 107  CO2 24 26  BUN 6 7  CREATININE 0.73 0.66  GLUCOSE 112* 113*  CALCIUM 8.9 9.1   No results for input(s): LABPT, INR in the last 72 hours.  Physical Exam: Neurologically intact ABD soft Intact pulses distally Incision: dressing C/D/I and no drainage No cellulitis present Compartment soft  Assessment/Plan: 4 Days Post-Op Procedure(s) (LRB): ARTHROSCOPIC WASHOUT LEFT KNEE (Left) Advance diet Up with therapy  Ok for d/c to home from ortho standpoint. 1. Abx management  Per ID team 2. Norco and robaxin for pain control 3. F/u with me in 1 week for re-evaluation 4. MRI of the ofoot - stress fracture.  No evidence of infection.  Pain improved - able to weight bear.  Will monitor. 5. ASA for dvt prevention   Nysir Fergusson D for Dr. Melina Schools [facility] [phone] 06/05/2016, 8:47 AM

## 2016-06-06 LAB — AEROBIC/ANAEROBIC CULTURE (SURGICAL/DEEP WOUND)

## 2016-06-06 LAB — CULTURE, BLOOD (ROUTINE X 2)
CULTURE: NO GROWTH
CULTURE: NO GROWTH

## 2016-06-07 ENCOUNTER — Other Ambulatory Visit: Payer: Self-pay | Admitting: Internal Medicine

## 2016-06-07 ENCOUNTER — Telehealth: Payer: Self-pay | Admitting: Internal Medicine

## 2016-06-07 DIAGNOSIS — B370 Candidal stomatitis: Secondary | ICD-10-CM | POA: Insufficient documentation

## 2016-06-07 MED ORDER — FLUCONAZOLE 100 MG PO TABS: 100.0000 mg | ORAL_TABLET | Freq: Every day | ORAL | 0 refills | Status: DC

## 2016-06-07 NOTE — Telephone Encounter (Signed)
I received a phone call this evening from Ms. Sconce daughter, Pearly Labianca. Ms. 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 She is receiving IV ceftriaxone at home. She has developed fever blisters and also has a white coating in her mouth that her home nurse said looks like thrush. They spoke with someone in Dr. Rolena Infante office this morning and apparently medication was going to be called into her pharmacy but has not been called in yet. I agreed to send in a prescription for fluconazole.

## 2016-06-23 ENCOUNTER — Telehealth: Payer: Self-pay | Admitting: Internal Medicine

## 2016-06-23 ENCOUNTER — Encounter (HOSPITAL_COMMUNITY): Payer: Self-pay | Admitting: Emergency Medicine

## 2016-06-23 ENCOUNTER — Inpatient Hospital Stay (HOSPITAL_COMMUNITY)
Admission: EM | Admit: 2016-06-23 | Discharge: 2016-06-29 | DRG: 489 | Disposition: A | Discharge status: Home Health | Payer: BLUE CROSS/BLUE SHIELD | Attending: Internal Medicine | Admitting: Internal Medicine

## 2016-06-23 ENCOUNTER — Emergency Department (HOSPITAL_COMMUNITY): Payer: BLUE CROSS/BLUE SHIELD

## 2016-06-23 DIAGNOSIS — S83282A Other tear of lateral meniscus, current injury, left knee, initial encounter: Secondary | ICD-10-CM | POA: Diagnosis present

## 2016-06-23 DIAGNOSIS — R52 Pain, unspecified: Secondary | ICD-10-CM

## 2016-06-23 DIAGNOSIS — D649 Anemia, unspecified: Secondary | ICD-10-CM | POA: Diagnosis present

## 2016-06-23 DIAGNOSIS — D72825 Bandemia: Secondary | ICD-10-CM | POA: Diagnosis not present

## 2016-06-23 DIAGNOSIS — M1712 Unilateral primary osteoarthritis, left knee: Secondary | ICD-10-CM | POA: Diagnosis present

## 2016-06-23 DIAGNOSIS — M00262 Other streptococcal arthritis, left knee: Secondary | ICD-10-CM | POA: Diagnosis not present

## 2016-06-23 DIAGNOSIS — D644 Congenital dyserythropoietic anemia: Secondary | ICD-10-CM | POA: Diagnosis not present

## 2016-06-23 DIAGNOSIS — M25569 Pain in unspecified knee: Secondary | ICD-10-CM

## 2016-06-23 DIAGNOSIS — Y95 Nosocomial condition: Secondary | ICD-10-CM

## 2016-06-23 DIAGNOSIS — Z79891 Long term (current) use of opiate analgesic: Secondary | ICD-10-CM

## 2016-06-23 DIAGNOSIS — Y92019 Unspecified place in single-family (private) house as the place of occurrence of the external cause: Secondary | ICD-10-CM

## 2016-06-23 DIAGNOSIS — M009 Pyogenic arthritis, unspecified: Secondary | ICD-10-CM | POA: Diagnosis present

## 2016-06-23 DIAGNOSIS — M65862 Other synovitis and tenosynovitis, left lower leg: Secondary | ICD-10-CM | POA: Diagnosis present

## 2016-06-23 DIAGNOSIS — M25469 Effusion, unspecified knee: Secondary | ICD-10-CM

## 2016-06-23 DIAGNOSIS — D638 Anemia in other chronic diseases classified elsewhere: Secondary | ICD-10-CM | POA: Diagnosis present

## 2016-06-23 DIAGNOSIS — R059 Cough, unspecified: Secondary | ICD-10-CM

## 2016-06-23 DIAGNOSIS — R05 Cough: Secondary | ICD-10-CM

## 2016-06-23 DIAGNOSIS — D72829 Elevated white blood cell count, unspecified: Secondary | ICD-10-CM

## 2016-06-23 DIAGNOSIS — D573 Sickle-cell trait: Secondary | ICD-10-CM | POA: Diagnosis present

## 2016-06-23 DIAGNOSIS — Z8739 Personal history of other diseases of the musculoskeletal system and connective tissue: Secondary | ICD-10-CM

## 2016-06-23 DIAGNOSIS — G8929 Other chronic pain: Secondary | ICD-10-CM

## 2016-06-23 DIAGNOSIS — Z832 Family history of diseases of the blood and blood-forming organs and certain disorders involving the immune mechanism: Secondary | ICD-10-CM

## 2016-06-23 DIAGNOSIS — E876 Hypokalemia: Secondary | ICD-10-CM | POA: Diagnosis present

## 2016-06-23 DIAGNOSIS — Z79899 Other long term (current) drug therapy: Secondary | ICD-10-CM

## 2016-06-23 DIAGNOSIS — R531 Weakness: Secondary | ICD-10-CM

## 2016-06-23 DIAGNOSIS — D509 Iron deficiency anemia, unspecified: Secondary | ICD-10-CM | POA: Diagnosis present

## 2016-06-23 DIAGNOSIS — M5489 Other dorsalgia: Secondary | ICD-10-CM | POA: Diagnosis not present

## 2016-06-23 DIAGNOSIS — S83242A Other tear of medial meniscus, current injury, left knee, initial encounter: Secondary | ICD-10-CM | POA: Diagnosis present

## 2016-06-23 DIAGNOSIS — J189 Pneumonia, unspecified organism: Secondary | ICD-10-CM

## 2016-06-23 DIAGNOSIS — M25562 Pain in left knee: Secondary | ICD-10-CM | POA: Diagnosis not present

## 2016-06-23 LAB — COMPREHENSIVE METABOLIC PANEL WITH GFR
ALT: 66 U/L — ABNORMAL HIGH (ref 14–54)
AST: 57 U/L — ABNORMAL HIGH (ref 15–41)
Albumin: 2.1 g/dL — ABNORMAL LOW (ref 3.5–5.0)
Alkaline Phosphatase: 486 U/L — ABNORMAL HIGH (ref 38–126)
Anion gap: 10 (ref 5–15)
BUN: 11 mg/dL (ref 6–20)
CO2: 23 mmol/L (ref 22–32)
Calcium: 9.5 mg/dL (ref 8.9–10.3)
Chloride: 99 mmol/L — ABNORMAL LOW (ref 101–111)
Creatinine, Ser: 0.68 mg/dL (ref 0.44–1.00)
GFR calc Af Amer: >60 mL/min
GFR calc non Af Amer: >60 mL/min
Glucose, Bld: 116 mg/dL — ABNORMAL HIGH (ref 65–99)
Potassium: 4 mmol/L (ref 3.5–5.1)
Sodium: 132 mmol/L — ABNORMAL LOW (ref 135–145)
Total Bilirubin: 0.9 mg/dL (ref 0.3–1.2)
Total Protein: 8.2 g/dL — ABNORMAL HIGH (ref 6.5–8.1)

## 2016-06-23 LAB — CBC WITH DIFFERENTIAL/PLATELET
BASOS ABS: 0 10*3/uL (ref 0.0–0.1)
BASOS ABS: 0 10*3/uL (ref 0.0–0.1)
BASOS PCT: 0 %
Band Neutrophils: 0 %
Basophils Relative: 0 %
Blasts: 0 %
EOS PCT: 3 %
Eosinophils Absolute: 0.5 10*3/uL (ref 0.0–0.7)
Eosinophils Absolute: 0.6 10*3/uL (ref 0.0–0.7)
Eosinophils Relative: 2 %
HEMATOCRIT: 21.7 % — AB (ref 36.0–46.0)
HEMATOCRIT: 19.3 % — AB (ref 36.0–46.0)
Hemoglobin: 7 g/dL — ABNORMAL LOW (ref 12.0–15.0)
Hemoglobin: 6.2 g/dL — CL (ref 12.0–15.0)
LYMPHS ABS: 1.4 10*3/uL (ref 0.7–4.0)
LYMPHS ABS: 2.2 10*3/uL (ref 0.7–4.0)
Lymphocytes Relative: 6 %
Lymphocytes Relative: 10 %
MCH: 24.3 pg — ABNORMAL LOW (ref 26.0–34.0)
MCH: 23.9 pg — ABNORMAL LOW (ref 26.0–34.0)
MCHC: 32.3 g/dL (ref 30.0–36.0)
MCHC: 32.1 g/dL (ref 30.0–36.0)
MCV: 75.3 fL — AB (ref 78.0–100.0)
MCV: 74.5 fL — AB (ref 78.0–100.0)
METAMYELOCYTES PCT: 0 %
MONO ABS: 0.7 10*3/uL (ref 0.1–1.0)
MONO ABS: 0.2 10*3/uL (ref 0.1–1.0)
MONOS PCT: 3 %
MONOS PCT: 1 %
MYELOCYTES: 0 %
NEUTROS ABS: 20.8 10*3/uL — AB (ref 1.7–7.7)
NEUTROS PCT: 86 %
NRBC: 0 /100{WBCs}
Neutro Abs: 18.6 10*3/uL — ABNORMAL HIGH (ref 1.7–7.7)
Neutrophils Relative %: 89 %
PLATELETS: 526 10*3/uL — AB (ref 150–400)
Platelets: 478 10*3/uL — ABNORMAL HIGH (ref 150–400)
Promyelocytes Absolute: 0 %
RBC: 2.88 MIL/uL — ABNORMAL LOW (ref 3.87–5.11)
RBC: 2.59 MIL/uL — AB (ref 3.87–5.11)
RDW: 26.2 % — AB (ref 11.5–15.5)
RDW: 25.9 % — AB (ref 11.5–15.5)
WBC: 23.4 10*3/uL — AB (ref 4.0–10.5)
WBC: 21.6 10*3/uL — AB (ref 4.0–10.5)

## 2016-06-23 LAB — I-STAT CG4 LACTIC ACID, ED: Lactic Acid, Venous: 0.95 mmol/L (ref 0.5–1.9)

## 2016-06-23 MED ORDER — HYDROCODONE-ACETAMINOPHEN 5-325 MG PO TABS
2.0000 | ORAL_TABLET | Freq: Once | ORAL | Status: AC
  Administered 2016-06-23: 2 via ORAL
  Filled 2016-06-23: qty 2

## 2016-06-23 MED ORDER — DEXTROSE 5 % IV SOLN
1.0000 g | Freq: Once | INTRAVENOUS | Status: AC
  Administered 2016-06-23: 1 g via INTRAVENOUS
  Filled 2016-06-23: qty 1

## 2016-06-23 MED ORDER — VANCOMYCIN HCL IN DEXTROSE 1-5 GM/200ML-% IV SOLN
1000.0000 mg | Freq: Two times a day (BID) | INTRAVENOUS | Status: DC
  Administered 2016-06-23 – 2016-06-26 (×7): 1000 mg via INTRAVENOUS
  Filled 2016-06-23 (×8): qty 200

## 2016-06-23 MED ORDER — SODIUM CHLORIDE 0.9 % IV BOLUS (SEPSIS)
1000.0000 mL | Freq: Once | INTRAVENOUS | Status: AC
  Administered 2016-06-23: 1000 mL via INTRAVENOUS

## 2016-06-23 NOTE — ED Notes (Signed)
Recollect CBC

## 2016-06-23 NOTE — ED Provider Notes (Signed)
[facility] DEPT Provider Note  CSN: [id number] Arrival date & time: 06/23/16  1538  History   Chief Complaint Chief Complaint  Patient presents with  . Abnormal Lab   HPI Angie Wright is a 61 y.o. female.  The history is provided by the patient and medical records. No language interpreter was used.  Illness  This is a new problem. The current episode started 2 days ago. The problem occurs constantly. The problem has been gradually worsening. Pertinent negatives include no chest pain, no abdominal pain, no headaches and no shortness of breath. Nothing aggravates the symptoms. Nothing relieves the symptoms.    Past Medical History:  Diagnosis Date  . Anemia    states "sickle cell trait, HGB always runs between 7 and 8"   Patient Active Problem List   Diagnosis Date Noted  . Leukocytosis 06/23/2016  . Thrush 06/07/2016  . Streptococcal arthritis of left knee ([place])   . Fever in adult   . Sepsis ([place])   . Right foot pain   . Septic arthritis of knee, left ([place]) 06/01/2016  . Sepsis, Gram positive ([place]) 06/01/2016  . Anemia 06/01/2016  . Sickle cell trait ([place]) 06/01/2016   Past Surgical History:  Procedure Laterality Date  . KNEE ARTHROSCOPY Left 06/01/2016   Procedure: ARTHROSCOPIC WASHOUT LEFT KNEE;  Surgeon: Melina Schools, MD;  Location: [facility];  Service: Orthopedics;  Laterality: Left;   OB History    No data available      Home Medications    Prior to Admission medications   Medication Sig Start Date End Date Taking? Authorizing Provider  acetaminophen (TYLENOL) 500 MG tablet Take 1,000 mg by mouth every 6 (six) hours as needed for headache (pain).   Yes Historical Provider, MD  calcium carbonate (OS-CAL - DOSED IN MG OF ELEMENTAL CALCIUM) 1250 (500 Ca) MG tablet Take 1 tablet by mouth daily with breakfast.   Yes Historical Provider, MD  cefTRIAXone 2 g in dextrose 5 % 50 mL Inject 2 g into the vein daily. 2gm IV Q24 hours, Continue IV ceftriaxone until 1/26  06/05/16  Yes Domenic Polite, MD  cholecalciferol (VITAMIN D) 1000 units tablet Take 1,000 Units by mouth daily.   Yes Historical Provider, MD  HYDROcodone-acetaminophen (NORCO) 7.5-325 MG tablet Take 1 tablet by mouth every 6 (six) hours as needed for moderate pain. 06/05/16  Yes Domenic Polite, MD  Multiple Vitamin (MULTIVITAMIN WITH MINERALS) TABS tablet Take 1 tablet by mouth daily.   Yes Historical Provider, MD  polyethylene glycol (MIRALAX / GLYCOLAX) packet Take 17 g by mouth daily as needed for moderate constipation. Patient taking differently: Take 17 g by mouth daily as needed for moderate constipation. Mix in 8 oz fluid and drink 06/05/16  Yes Domenic Polite, MD  potassium chloride SA (K-DUR,KLOR-CON) 20 MEQ tablet Take 1 tablet (20 mEq total) by mouth daily. 06/05/16  Yes Domenic Polite, MD   Family History Family History  Problem Relation Age of Onset  . Sickle cell anemia Brother     2 brothers passed with sickle cell anemia   Social History Social History  Substance Use Topics  . Smoking status: Never Smoker  . Smokeless tobacco: Never Used  . Alcohol use No    Allergies   Patient has no known allergies.   Review of Systems Review of Systems  Constitutional: Positive for fatigue. Negative for appetite change, chills and fever.  Respiratory: Positive for cough. Negative for shortness of breath.   Cardiovascular: Negative for  chest pain.  Gastrointestinal: Negative for abdominal pain, diarrhea, nausea and vomiting.  Genitourinary: Negative for dysuria, frequency and urgency.  Musculoskeletal: Positive for arthralgias (L knee, improved since surgery), joint swelling (improved since surgery) and myalgias.  Neurological: Negative for headaches.  All other systems reviewed and are negative.   Physical Exam Updated Vital Signs BP 120/61   Pulse 89   Temp 99.2 F (37.3 C) (Oral)   Resp 19   SpO2 100%   Physical Exam  Constitutional: She is oriented to person, place, and  time. No distress.  Elderly African American female  HENT:  Head: Normocephalic and atraumatic.  Eyes: EOM are normal. Pupils are equal, round, and reactive to light.  Neck: Normal range of motion. Neck supple.  Cardiovascular: Regular rhythm and normal heart sounds.  Tachycardia present.   HR low 100's, normotensive  Pulmonary/Chest: Effort normal. No respiratory distress. She has no rales.  Coarse breath sounds at bases bilaterally, no increased work of breathing, maintaining sats on room air  Abdominal: Soft. Bowel sounds are normal. She exhibits no distension. There is no tenderness.  Musculoskeletal: Normal range of motion. She exhibits edema (L knee).  Moderate effusion to the left knee, no pain with range of motion, no overlying erythema or induration arthroscopic surgical incisions healing well without evidence of premature drainage  Neurological: She is alert and oriented to person, place, and time.  Skin: Skin is warm and dry. Capillary refill takes less than 2 seconds. She is not diaphoretic.  Nursing note and vitals reviewed.   ED Treatments / Results  Labs (all labs ordered are listed, but only abnormal results are displayed) Labs Reviewed  COMPREHENSIVE METABOLIC PANEL - Abnormal; Notable for the following:       Result Value   Sodium 132 (*)    Chloride 99 (*)    Glucose, Bld 116 (*)    Total Protein 8.2 (*)    Albumin 2.1 (*)    AST 57 (*)    ALT 66 (*)    Alkaline Phosphatase 486 (*)    All other components within normal limits  CBC WITH DIFFERENTIAL/PLATELET - Abnormal; Notable for the following:    WBC 23.4 (*)    RBC 2.88 (*)    Hemoglobin 7.0 (*)    HCT 21.7 (*)    MCV 75.3 (*)    MCH 24.3 (*)    RDW 26.2 (*)    Platelets 526 (*)    Neutro Abs 20.8 (*)    All other components within normal limits  CBC WITH DIFFERENTIAL/PLATELET - Abnormal; Notable for the following:    WBC 21.6 (*)    RBC 2.59 (*)    Hemoglobin 6.2 (*)    HCT 19.3 (*)    MCV 74.5  (*)    MCH 23.9 (*)    RDW 25.9 (*)    Platelets 478 (*)    Neutro Abs 18.6 (*)    All other components within normal limits  CULTURE, BLOOD (ROUTINE X 2)  CULTURE, BLOOD (ROUTINE X 2)  URINE CULTURE  URINALYSIS, ROUTINE W REFLEX MICROSCOPIC  INFLUENZA PANEL BY PCR (TYPE A & B)  I-STAT CG4 LACTIC ACID, ED  TYPE AND SCREEN   EKG  EKG Interpretation  Date/Time:  Friday June 23 2016 18:35:58 EST Ventricular Rate:  109 PR Interval:    QRS Duration: 81 QT Interval:  321 QTC Calculation: 433 R Axis:     Text Interpretation:  Sinus tachycardia Left ventricular hypertrophy Baseline  wander in lead(s) I V2 Confirmed by ZAVITZ MD, JOSHUA [phone]) on 06/23/2016 6:57:32 PM      Radiology Dg Chest 2 View  Result Date: 06/23/2016 CLINICAL DATA:  Elevated WBC, cough EXAM: CHEST  2 VIEW COMPARISON:  03/31/2016 FINDINGS: Right-sided PICC line with the tip projecting over the lower SVC. Bilateral trace pleural effusions. Left lower lobe airspace disease. Minimal right lower lobe airspace disease. Bilateral interstitial thickening. No pneumothorax. Stable cardiomegaly. Moderate osteoarthritis of bilateral glenohumeral joints. IMPRESSION: 1. Right-sided PICC line with the tip projecting over the lower SVC. 2. Bilateral interstitial thickening and bilateral lower lobe airspace disease which may reflect interstitial edema and atelectasis versus multilobar pneumonia. Electronically Signed   By: Kathreen Devoid   On: 06/23/2016 17:50   Procedures Procedures (including critical care time)  Medications Ordered in ED Medications  vancomycin (VANCOCIN) IVPB 1000 mg/200 mL premix (0 mg Intravenous Stopped 06/23/16 2226)  sodium chloride 0.9 % bolus 1,000 mL (0 mLs Intravenous Stopped 06/23/16 2226)  ceFEPIme (MAXIPIME) 1 g in dextrose 5 % 50 mL IVPB (0 g Intravenous Stopped 06/23/16 2015)  HYDROcodone-acetaminophen (NORCO/VICODIN) 5-325 MG per tablet 2 tablet (2 tablets Oral Given 06/23/16 2031)    Initial  Impression / Assessment and Plan / ED Course  I have reviewed the triage vital signs and the nursing notes.  61 y.o. female with above stated PMHx, HPI, and physical. Patient recently admitted to our facility from 12/27-1/1 group B streptococcal septic arthritis for which patient underwent washout (12/28) and has been receiving Vanc/CTX then CTX 2g IV daily. WBC increased from 17 to 35 with 89% neutrophils.  CBC notable for leukocytosis to 23 increased from previous. Patient with opacities on chest x-ray. Given patient's cough & current IV antibiotic use, this is concerning for hospital associated pneumonia. Blood cultures obtained. Patient started on broad-spectrum antibiotics with vancomycin/Zosyn.  Laboratory and imaging results were personally reviewed by myself and used in the medical decision making of this patient's treatment and disposition.  Pt admitted to medicine for further evaluation and management of HAP. Pt understands and agrees with the plan and has no further questions or concerns.   Pt care discussed with and followed by my attending, Dr. Birdie Sons, MD Pager [phone]  Final Clinical Impressions(s) / ED Diagnoses   Final diagnoses:  History of septic arthritis  Generalized weakness  Body aches  Cough  Leukocytosis  HAP (hospital-acquired pneumonia)   New Prescriptions New Prescriptions   No medications on file     Mayer Camel, MD 06/24/16 [id number]    Elnora Morrison, MD 06/26/16 [phone]

## 2016-06-23 NOTE — ED Notes (Signed)
Admitting MD at bedside.

## 2016-06-23 NOTE — H&P (Signed)
History and Physical  Patient Name: Angie Wright     [id number]    DOB: Mar 30, 1956    DOA: 06/23/2016 PCP: No PCP Per Patient   [facility] coming from[facility]  Chief Complaint: Abnormal labs  HPI: Angie Wright is a 61 y.o. female with a past medical history significant for iron deficiency anemia and recent S pneumo septic arthritis of hte LEFT knee who presents with abnormal labs.  The patient was admitted last Dec with LEFT knee swelling, found to have septic arthritis, debrided by Dr. Rolena Infante Dec 28th, growing S pneumo.  Discharged on ceftriaxone 2g daily, which she has been getting at home.  Over the last two days, she has had a new dry cough, felt generally very weak and tired, but no new fever, knee pain, redness or swelling or discharge from knee, other skin infections, dysuria, hematuria, voiding irritation, headache.  Has mild back pain, paraspinal though.  Today, she had routine labs return showing her WBC up to 30K and Hgb dropped from 9 to 7, so she was told to come to the ER.  ED course: -Temp 99.13F, heart rate 108, respirations and pulse ox normal, BP 136/42 -Na 132, K 4.0, Cr 0.68, WBC 23.4K, Hgb 7, dropped to 6.2 on repeat in ER -Lactic acid 0.9 -CXR showed bilateral lower opacities consistent with pneumonia -Cultures were drawn, she was given vanc and cefepime, T&S was done, and TRH were asked to evaluate      ROS: Review of Systems  Constitutional: Negative for chills, diaphoresis and fever.  Respiratory: Positive for cough and shortness of breath (mild). Negative for sputum production.   Gastrointestinal: Negative for abdominal pain, nausea and vomiting.  Genitourinary: Negative for dysuria, frequency and urgency.  Musculoskeletal: Positive for back pain and joint pain (mild, not worsening). Negative for myalgias and neck pain.  Neurological: Positive for weakness.  All other systems reviewed and are negative.         Past Medical History:  Diagnosis Date  .  Anemia    states "sickle cell trait, HGB always runs between 7 and 8"    Past Surgical History:  Procedure Laterality Date  . KNEE ARTHROSCOPY Left 06/01/2016   Procedure: ARTHROSCOPIC WASHOUT LEFT KNEE;  Surgeon: Melina Schools, MD;  Location: [facility];  Service: Orthopedics;  Laterality: Left;    Social History: Patient lives with her husband.  The patient walks unassisted, recently using a walker while she rehabs her knee.  From [place].  Is an Optometrist.  Does not smoke.    No Known Allergies  Family history: family history includes Sickle cell anemia in her brother.  Prior to Admission medications   Medication Sig Start Date End Date Taking? Authorizing Provider  acetaminophen (TYLENOL) 500 MG tablet Take 1,000 mg by mouth every 6 (six) hours as needed for headache (pain).   Yes Historical Provider, MD  calcium carbonate (OS-CAL - DOSED IN MG OF ELEMENTAL CALCIUM) 1250 (500 Ca) MG tablet Take 1 tablet by mouth daily with breakfast.   Yes Historical Provider, MD  cefTRIAXone 2 g in dextrose 5 % 50 mL Inject 2 g into the vein daily. 2gm IV Q24 hours, Continue IV ceftriaxone until 1/26 06/05/16  Yes Domenic Polite, MD  cholecalciferol (VITAMIN D) 1000 units tablet Take 1,000 Units by mouth daily.   Yes Historical Provider, MD  HYDROcodone-acetaminophen (NORCO) 7.5-325 MG tablet Take 1 tablet by mouth every 6 (six) hours as needed for moderate pain. 06/05/16  Yes Preetha  Broadus John, MD  Multiple Vitamin (MULTIVITAMIN WITH MINERALS) TABS tablet Take 1 tablet by mouth daily.   Yes Historical Provider, MD  polyethylene glycol (MIRALAX / GLYCOLAX) packet Take 17 g by mouth daily as needed for moderate constipation. Patient taking differently: Take 17 g by mouth daily as needed for moderate constipation. Mix in 8 oz fluid and drink 06/05/16  Yes Domenic Polite, MD  potassium chloride SA (K-DUR,KLOR-CON) 20 MEQ tablet Take 1 tablet (20 mEq total) by mouth daily. 06/05/16  Yes Domenic Polite, MD        Physical Exam: BP 135/62   Pulse (!) 143   Temp 99.2 F (37.3 C) (Oral)   Resp 20   SpO2 100%  General appearance: Well-developed, adult female, alert and in no acute distress.  Nontoxic appearing. Eyes: Anicteric, conjunctiva pink, lids and lashes normal. PERRL.    ENT: No nasal deformity, discharge, epistaxis.  Hearing normal. OP moist without lesions.   Neck: No neck masses.  Trachea midline.  No thyromegaly/tenderness. Lymph: No cervical or supraclavicular lymphadenopathy. Skin: Warm and dry.  No jaundice.  No suspicious rashes or lesions or boils or redness/swelling. Cardiac: Tachycardic, regular, nl S1-S2, no murmurs appreciated.  Capillary refill is brisk.  JVP normal.  No LE edema.  Radial and DP pulses 2+ and symmetric. Respiratory: Normal respiratory rate and rhythm.  No wheezes, rales at right base. Abdomen: Abdomen soft.  No TTP. No ascites, distension, hepatosplenomegaly.   MSK: No deformities.  The LEFT knee is still swollen, but not red, warm.  She is able to extend fully, bend somewhat without pain, still lacking about 90 degrees flexion.  Minor paraspinal tenderness, no vertebral tenderness.  No cyanosis or clubbing. Neuro: Cranial nerves 3-12 intact.  Sensation intact to light touch. Speech is fluent.  Muscle strength normal.    Psych: Sensorium intact and responding to questions, attention normal.  Behavior appropriate.  Affect pleasant.  Judgment and insight appear normal.     Labs on Admission:  I have personally reviewed following labs and imaging studies: CBC:  Recent Labs Lab 06/23/16 1828 06/23/16 2130  WBC 23.4* 21.6*  NEUTROABS 20.8* 18.6*  HGB 7.0* 6.2*  HCT 21.7* 19.3*  MCV 75.3* 74.5*  PLT 526* 123456*   Basic Metabolic Panel:  Recent Labs Lab 06/23/16 1828  NA 132*  K 4.0  CL 99*  CO2 23  GLUCOSE 116*  BUN 11  CREATININE 0.68  CALCIUM 9.5   GFR: CrCl cannot be calculated (Unknown ideal weight.).  Liver Function  Tests:  Recent Labs Lab 06/23/16 1828  AST 57*  ALT 66*  ALKPHOS 486*  BILITOT 0.9  PROT 8.2*  ALBUMIN 2.1*   No results for input(s): LIPASE, AMYLASE in the last 168 hours. No results for input(s): AMMONIA in the last 168 hours. Coagulation Profile: No results for input(s): INR, PROTIME in the last 168 hours. Cardiac Enzymes: No results for input(s): CKTOTAL, CKMB, CKMBINDEX, TROPONINI in the last 168 hours. BNP (last 3 results) No results for input(s): PROBNP in the last 8760 hours. HbA1C: No results for input(s): HGBA1C in the last 72 hours. CBG: No results for input(s): GLUCAP in the last 168 hours. Lipid Profile: No results for input(s): CHOL, HDL, LDLCALC, TRIG, CHOLHDL, LDLDIRECT in the last 72 hours. Thyroid Function Tests: No results for input(s): TSH, T4TOTAL, FREET4, T3FREE, THYROIDAB in the last 72 hours. Anemia Panel: No results for input(s): VITAMINB12, FOLATE, FERRITIN, TIBC, IRON, RETICCTPCT in the last 72 hours. Sepsis Labs: Lactic  acid 0.95 Invalid input(s): PROCALCITONIN, LACTICIDVEN No results found for this or any previous visit (from the past 240 hour(s)).       Radiological Exams on Admission: Personally reviewed CXR shows bilateral lower lobe opacities, consistent with pneumonia: Dg Chest 2 View  Result Date: 06/23/2016 CLINICAL DATA:  Elevated WBC, cough EXAM: CHEST  2 VIEW COMPARISON:  03/31/2016 FINDINGS: Right-sided PICC line with the tip projecting over the lower SVC. Bilateral trace pleural effusions. Left lower lobe airspace disease. Minimal right lower lobe airspace disease. Bilateral interstitial thickening. No pneumothorax. Stable cardiomegaly. Moderate osteoarthritis of bilateral glenohumeral joints. IMPRESSION: 1. Right-sided PICC line with the tip projecting over the lower SVC. 2. Bilateral interstitial thickening and bilateral lower lobe airspace disease which may reflect interstitial edema and atelectasis versus multilobar pneumonia.  Electronically Signed   By: Kathreen Devoid   On: 06/23/2016 17:50    EKG: Independently reviewed. Rate 109, QTc 433, sinus, no ST changes.  Echocardiogram Dec 2017: EF 55-60% Grade I DD No vegetations      Assessment/Plan  1. Leukocytosis, possible HCAP:  Only localizing new symptoms are cough, and this is correlative with CXR.  No diarrhea, doubt C diff.  No skin infections, knee stable, no other obvious nidus of infection. -Check flu panel -Will cover empirically with vancomycin and cefepime for now -Sputum culture -Follow blood cultuers -Consult to ID, appreciate cares  2. Septic arthritis:  -Hold CTX while on vanc/Cefepime  3. Elevated LFTs:  Abdomen exam benign and asymptomatic.   -Trend LFTs and image abdomen if worsening  4. Anemia, iron deficiency:  No history or exam evidence of bleeding.  Hgb 6.2 in ER.  Ferritin 300 in Dec. -Transfuse 1 unit and repeat in AM -Check haptoglobin, LDH      DVT prophylaxis: Lovenox  Code Status: FULL  Family Communication: Husband at bedside, overnight plan discussed  Disposition Plan: Anticipate transfusion, follow cultures, consult to ID, empiric antibiotics for now, until cultures return. Consults called: None overnight Admission status: OBS At the point of initial evaluation, it is my clinical opinion that admission for OBSERVATION is reasonable and necessary because the patient's presenting complaints in the context of their chronic conditions represent sufficient risk of deterioration or significant morbidity to constitute reasonable grounds for close observation in the hospital setting, but that the patient may be medically stable for discharge from the hospital within 24 to 48 hours.    Medical decision making: Patient seen at 10:10 PM on 06/23/2016.  The patient was discussed with Dr. Claretta Fraise.  What exists of the patient's chart was reviewed in depth and summarized above.  Clinical condition: stable.         Edwin Dada [facility] Pager [phone]

## 2016-06-23 NOTE — Progress Notes (Signed)
Pharmacy Antibiotic Note  Angie Wright is a 61 y.o. female admitted on 06/23/2016 with pneumonia.  Pharmacy has been consulted for vancomycin dosing.  Plan: Vancomycin 1g IV every 12 hours.  Goal trough 15-20 mcg/mL.  Monitor culture data, renal function and clinical course VT at SS prn     Temp (24hrs), Avg:99.2 F (37.3 C), Min:99.2 F (37.3 C), Max:99.2 F (37.3 C)  No results for input(s): WBC, CREATININE, LATICACIDVEN, VANCOTROUGH, VANCOPEAK, VANCORANDOM, GENTTROUGH, GENTPEAK, GENTRANDOM, TOBRATROUGH, TOBRAPEAK, TOBRARND, AMIKACINPEAK, AMIKACINTROU, AMIKACIN in the last 168 hours.  CrCl cannot be calculated (Unknown ideal weight.).    No Known Allergies   Andrey Cota. Diona Foley, PharmD, [place] Clinical Pharmacist Pager [phone] 06/23/2016 6:24 PM

## 2016-06-23 NOTE — ED Triage Notes (Signed)
Pt receiving home health antibiotics for left knee infection. Per daughter pt had labs drawn Monday which showed elevated WBC counts. Were told to come to ED. Pt denies recent fever. REports some weakness and a dry cough. Denies recent fever.

## 2016-06-23 NOTE — Telephone Encounter (Signed)
Recent labs are significantly abnormal from the week prior.  I have asked her to come to the ED for evaluation and likely admission to treat underlying process  She has been on ceftriaxone 2gm IV daily for group B streptococcal septic arthritis,  Wbc last week, 17K now increased to 35K with 89%N. HGb 7.0, HCT 21.2, PLT at 593 from 1/17  Patient denies fever, chills, no diarrhea, mild knee pain, less foot swelling  Concern that she has untreated nidus of infection, needs urgent evaluation. Also may need blood transfusion if symptomatic

## 2016-06-24 DIAGNOSIS — D644 Congenital dyserythropoietic anemia: Secondary | ICD-10-CM | POA: Diagnosis not present

## 2016-06-24 DIAGNOSIS — M00862 Arthritis due to other bacteria, left knee: Secondary | ICD-10-CM | POA: Diagnosis not present

## 2016-06-24 DIAGNOSIS — Z79891 Long term (current) use of opiate analgesic: Secondary | ICD-10-CM | POA: Diagnosis not present

## 2016-06-24 DIAGNOSIS — Y92019 Unspecified place in single-family (private) house as the place of occurrence of the external cause: Secondary | ICD-10-CM | POA: Diagnosis not present

## 2016-06-24 DIAGNOSIS — G8929 Other chronic pain: Secondary | ICD-10-CM | POA: Diagnosis present

## 2016-06-24 DIAGNOSIS — B9689 Other specified bacterial agents as the cause of diseases classified elsewhere: Secondary | ICD-10-CM

## 2016-06-24 DIAGNOSIS — R531 Weakness: Secondary | ICD-10-CM | POA: Diagnosis not present

## 2016-06-24 DIAGNOSIS — S83242A Other tear of medial meniscus, current injury, left knee, initial encounter: Secondary | ICD-10-CM | POA: Diagnosis present

## 2016-06-24 DIAGNOSIS — R05 Cough: Secondary | ICD-10-CM | POA: Diagnosis not present

## 2016-06-24 DIAGNOSIS — M65862 Other synovitis and tenosynovitis, left lower leg: Secondary | ICD-10-CM | POA: Diagnosis present

## 2016-06-24 DIAGNOSIS — M25562 Pain in left knee: Secondary | ICD-10-CM | POA: Diagnosis present

## 2016-06-24 DIAGNOSIS — D509 Iron deficiency anemia, unspecified: Secondary | ICD-10-CM | POA: Diagnosis present

## 2016-06-24 DIAGNOSIS — J189 Pneumonia, unspecified organism: Secondary | ICD-10-CM | POA: Diagnosis present

## 2016-06-24 DIAGNOSIS — M00062 Staphylococcal arthritis, left knee: Secondary | ICD-10-CM | POA: Diagnosis not present

## 2016-06-24 DIAGNOSIS — S83282A Other tear of lateral meniscus, current injury, left knee, initial encounter: Secondary | ICD-10-CM | POA: Diagnosis present

## 2016-06-24 DIAGNOSIS — D573 Sickle-cell trait: Secondary | ICD-10-CM | POA: Diagnosis present

## 2016-06-24 DIAGNOSIS — D638 Anemia in other chronic diseases classified elsewhere: Secondary | ICD-10-CM | POA: Diagnosis present

## 2016-06-24 DIAGNOSIS — Z79899 Other long term (current) drug therapy: Secondary | ICD-10-CM | POA: Diagnosis not present

## 2016-06-24 DIAGNOSIS — D72829 Elevated white blood cell count, unspecified: Secondary | ICD-10-CM | POA: Diagnosis not present

## 2016-06-24 DIAGNOSIS — M009 Pyogenic arthritis, unspecified: Secondary | ICD-10-CM | POA: Diagnosis present

## 2016-06-24 DIAGNOSIS — M1712 Unilateral primary osteoarthritis, left knee: Secondary | ICD-10-CM | POA: Diagnosis present

## 2016-06-24 DIAGNOSIS — M5489 Other dorsalgia: Secondary | ICD-10-CM | POA: Diagnosis not present

## 2016-06-24 DIAGNOSIS — M00262 Other streptococcal arthritis, left knee: Secondary | ICD-10-CM | POA: Diagnosis not present

## 2016-06-24 DIAGNOSIS — Z9889 Other specified postprocedural states: Secondary | ICD-10-CM | POA: Diagnosis not present

## 2016-06-24 DIAGNOSIS — E876 Hypokalemia: Secondary | ICD-10-CM | POA: Diagnosis present

## 2016-06-24 DIAGNOSIS — Z832 Family history of diseases of the blood and blood-forming organs and certain disorders involving the immune mechanism: Secondary | ICD-10-CM

## 2016-06-24 LAB — COMPREHENSIVE METABOLIC PANEL
ALT: 50 U/L (ref 14–54)
ANION GAP: 9 (ref 5–15)
AST: 38 U/L (ref 15–41)
Albumin: 1.8 g/dL — ABNORMAL LOW (ref 3.5–5.0)
Alkaline Phosphatase: 360 U/L — ABNORMAL HIGH (ref 38–126)
BUN: 9 mg/dL (ref 6–20)
CHLORIDE: 102 mmol/L (ref 101–111)
CO2: 22 mmol/L (ref 22–32)
Calcium: 9 mg/dL (ref 8.9–10.3)
Creatinine, Ser: 0.59 mg/dL (ref 0.44–1.00)
Glucose, Bld: 100 mg/dL — ABNORMAL HIGH (ref 65–99)
Potassium: 4 mmol/L (ref 3.5–5.1)
SODIUM: 133 mmol/L — AB (ref 135–145)
Total Bilirubin: 0.8 mg/dL (ref 0.3–1.2)
Total Protein: 7.1 g/dL (ref 6.5–8.1)

## 2016-06-24 LAB — URINALYSIS, ROUTINE W REFLEX MICROSCOPIC
Bilirubin Urine: NEGATIVE
Glucose, UA: NEGATIVE mg/dL
Ketones, ur: NEGATIVE mg/dL
Leukocytes, UA: NEGATIVE
Nitrite: NEGATIVE
PH: 6 (ref 5.0–8.0)
Protein, ur: 100 mg/dL — AB
SPECIFIC GRAVITY, URINE: 1.012 (ref 1.005–1.030)

## 2016-06-24 LAB — CBC
HCT: 22.1 % — ABNORMAL LOW (ref 36.0–46.0)
Hemoglobin: 7 g/dL — ABNORMAL LOW (ref 12.0–15.0)
MCH: 24.3 pg — AB (ref 26.0–34.0)
MCHC: 31.7 g/dL (ref 30.0–36.0)
MCV: 76.7 fL — ABNORMAL LOW (ref 78.0–100.0)
PLATELETS: 499 10*3/uL — AB (ref 150–400)
RBC: 2.88 MIL/uL — ABNORMAL LOW (ref 3.87–5.11)
RDW: 25.3 % — AB (ref 11.5–15.5)
WBC: 19.8 10*3/uL — ABNORMAL HIGH (ref 4.0–10.5)

## 2016-06-24 LAB — INFLUENZA PANEL BY PCR (TYPE A & B)
Influenza A By PCR: NEGATIVE
Influenza B By PCR: NEGATIVE

## 2016-06-24 LAB — STREP PNEUMONIAE URINARY ANTIGEN: Strep Pneumo Urinary Antigen: POSITIVE — AB

## 2016-06-24 LAB — PREPARE RBC (CROSSMATCH)

## 2016-06-24 MED ORDER — ENOXAPARIN SODIUM 40 MG/0.4ML ~~LOC~~ SOLN
40.0000 mg | Freq: Every day | SUBCUTANEOUS | Status: DC
Start: 2016-06-24 — End: 2016-06-25
  Administered 2016-06-24: 40 mg via SUBCUTANEOUS
  Filled 2016-06-24 (×2): qty 0.4

## 2016-06-24 MED ORDER — ACETAMINOPHEN 325 MG PO TABS: 650.0000 mg | ORAL_TABLET | Freq: Four times a day (QID) | ORAL | Status: DC | PRN

## 2016-06-24 MED ORDER — ONDANSETRON HCL 4 MG/2ML IJ SOLN: 4.0000 mg | Freq: Four times a day (QID) | INTRAMUSCULAR | Status: DC | PRN

## 2016-06-24 MED ORDER — HYDROCOD POLST-CPM POLST ER 10-8 MG/5ML PO SUER
5.0000 mL | Freq: Two times a day (BID) | ORAL | Status: DC | PRN
  Administered 2016-06-24 – 2016-06-28 (×6): 5 mL via ORAL
  Filled 2016-06-24 (×6): qty 5

## 2016-06-24 MED ORDER — HYDROCODONE-ACETAMINOPHEN 5-325 MG PO TABS
1.5000 | ORAL_TABLET | Freq: Four times a day (QID) | ORAL | Status: DC | PRN
  Administered 2016-06-24 – 2016-06-29 (×11): 1.5 via ORAL
  Filled 2016-06-24 (×11): qty 2

## 2016-06-24 MED ORDER — ONDANSETRON HCL 4 MG PO TABS: 4.0000 mg | ORAL_TABLET | Freq: Four times a day (QID) | ORAL | Status: DC | PRN

## 2016-06-24 MED ORDER — POLYETHYLENE GLYCOL 3350 17 G PO PACK
17.0000 g | PACK | Freq: Every day | ORAL | Status: DC | PRN
Start: 2016-06-24 — End: 2016-06-29

## 2016-06-24 MED ORDER — DEXTROSE 5 % IV SOLN
1.0000 g | Freq: Three times a day (TID) | INTRAVENOUS | Status: DC
  Administered 2016-06-24 – 2016-06-25 (×4): 1 g via INTRAVENOUS
  Filled 2016-06-24 (×8): qty 1

## 2016-06-24 MED ORDER — SODIUM CHLORIDE 0.9 % IV SOLN
INTRAVENOUS | Status: DC
  Administered 2016-06-24: 07:00:00 via INTRAVENOUS
  Administered 2016-06-24 – 2016-06-26 (×3): 1 mL via INTRAVENOUS

## 2016-06-24 MED ORDER — ACETAMINOPHEN 650 MG RE SUPP: 650.0000 mg | Freq: Four times a day (QID) | RECTAL | Status: DC | PRN

## 2016-06-24 MED ORDER — SODIUM CHLORIDE 0.9 % IV SOLN: Freq: Once | INTRAVENOUS | Status: DC

## 2016-06-24 NOTE — Progress Notes (Signed)
Patient seen and evaluated today in the [facility] ED Rm 18 by me, chart reviewed, please see EMR for updated orders. Please see full H&P dictated by admitting physician for same date of service.

## 2016-06-24 NOTE — Consult Note (Signed)
[facility]       Reason for Consult: leukocytosis    Referring Physician: Dr. Loleta Books  Principal Problem:   Leukocytosis Active Problems:   Septic arthritis of knee, left (HCC)   Anemia   . enoxaparin (LOVENOX) injection  40 mg Subcutaneous Daily    Recommendations: Continue vancomycin and cefepime  Will follow cultures I will check a respiratory viral panel  Assessment: She has leukocytosis up to 30,000 at home associated with cough, rales on right base, no knee pain or warmth.  I most suspect a respiratory infection (viral vs bacterial), knee appears stable, vs line infection.  Potentially could be fluid/interstitial edema.  With a dry cough, multifocal areas, viral etiology most likely.   Antibiotics: Vancomycin and cefepime  HPI: Angie Wright is a 61 y.o. female with left knee septic arthritis with debridement done in December by Dr. Rolena Infante with a positive culture with Strep pneumo who has had increasing WBC and 2-3 days of dry cough, weakness.  No worsening knee pain and is walking on it with a walker ok, no fever.  With recent leukocytosis, her ceftriaxone was changed to vancomycin yesterday but was sent in to ED with labs concerns.  Her cough has been persistent.  Recent echo in December with a normal EF.   CXR independently reviewed  Review of Systems:  Constitutional: negative for fevers, chills and fatigue Respiratory: positive for cough, negative for sputum, hemoptysis or wheezing Gastrointestinal: negative for diarrhea Integument/breast: negative for rash Musculoskeletal: positive for more left knee swelling, negative for pain, warmth All other systems reviewed and are negative    Past Medical History:  Diagnosis Date  . Anemia    states "sickle cell trait, HGB always runs between 7 and 8"    Social History  Substance Use Topics  . Smoking status: Never Smoker  . Smokeless tobacco: Never Used  . Alcohol use No     Family History  Problem Relation Age of Onset  . Sickle cell anemia Brother     2 brothers passed with sickle cell anemia    No Known Allergies  Physical Exam: Constitutional: in no apparent distress and alert  Vitals:   06/24/16 0900 06/24/16 1100  BP: 124/60 (!) 114/50  Pulse: 89 88  Resp: 19   Temp:     EYES: anicteric ENMT: no thrush Cardiovascular: Cor RRR Respiratory: right base with crackle about 1/3 of base; normal respiratory effort GI: Bowel sounds are normal, liver is not enlarged, spleen is not enlarged Musculoskeletal: left knee with some edema, no warmth, no erythema Skin: negatives: no rash Neuro: non focal  Lab Results  Component Value Date   WBC 19.8 (H) 06/24/2016   HGB 7.0 (L) 06/24/2016   HCT 22.1 (L) 06/24/2016   MCV 76.7 (L) 06/24/2016   PLT 499 (H) 06/24/2016    Lab Results  Component Value Date   CREATININE 0.59 06/24/2016   BUN 9 06/24/2016   NA 133 (L) 06/24/2016   K 4.0 06/24/2016   CL 102 06/24/2016   CO2 22 06/24/2016    Lab Results  Component Value Date   ALT 50 06/24/2016   AST 38 06/24/2016   ALKPHOS 360 (H) 06/24/2016     Microbiology: Recent Results (from the past 240 hour(s))  Blood Culture (routine x 2)     Status: None (Preliminary result)   Collection Time: 06/23/16  6:15 PM  Result Value Ref Range Status   Specimen Description  BLOOD LEFT WRIST  Final   Special Requests IN PEDIATRIC BOTTLE 3CC  Final   Culture PENDING  Incomplete   Report Status PENDING  Incomplete  Blood Culture (routine x 2)     Status: None (Preliminary result)   Collection Time: 06/23/16  6:28 PM  Result Value Ref Range Status   Specimen Description BLOOD LEFT ARM  Final   Special Requests BOTTLES DRAWN AEROBIC AND ANAEROBIC 5CC  Final   Culture NO GROWTH < 24 HOURS  Final   Report Status PENDING  Incomplete    Scharlene Gloss, [facility] [email or web address] [phone] pager   [phone] cell 06/24/2016, 3:29 PM

## 2016-06-24 NOTE — ED Notes (Signed)
Pt ambulated to bathroom with standby assistance.

## 2016-06-24 NOTE — ED Notes (Signed)
Attempted to call report after 20 min of assignment, put on hold for 8 min. Floor unaware of pt coming, floor to call back after assigning.

## 2016-06-25 ENCOUNTER — Inpatient Hospital Stay (HOSPITAL_COMMUNITY): Payer: BLUE CROSS/BLUE SHIELD

## 2016-06-25 LAB — RESPIRATORY PANEL BY PCR
Adenovirus: NOT DETECTED
BORDETELLA PERTUSSIS-RVPCR: NOT DETECTED
CORONAVIRUS HKU1-RVPPCR: NOT DETECTED
Chlamydophila pneumoniae: NOT DETECTED
Coronavirus 229E: NOT DETECTED
Coronavirus NL63: NOT DETECTED
Coronavirus OC43: NOT DETECTED
INFLUENZA A-RVPPCR: NOT DETECTED
Influenza B: NOT DETECTED
METAPNEUMOVIRUS-RVPPCR: NOT DETECTED
Mycoplasma pneumoniae: NOT DETECTED
PARAINFLUENZA VIRUS 2-RVPPCR: NOT DETECTED
PARAINFLUENZA VIRUS 3-RVPPCR: NOT DETECTED
PARAINFLUENZA VIRUS 4-RVPPCR: NOT DETECTED
Parainfluenza Virus 1: NOT DETECTED
RHINOVIRUS / ENTEROVIRUS - RVPPCR: NOT DETECTED
Respiratory Syncytial Virus: NOT DETECTED

## 2016-06-25 LAB — URINE CULTURE

## 2016-06-25 LAB — CBC WITH DIFFERENTIAL/PLATELET
BASOS PCT: 1 %
Basophils Absolute: 0.2 10*3/uL — ABNORMAL HIGH (ref 0.0–0.1)
EOS PCT: 5 %
Eosinophils Absolute: 0.8 10*3/uL — ABNORMAL HIGH (ref 0.0–0.7)
HCT: 20.8 % — ABNORMAL LOW (ref 36.0–46.0)
Hemoglobin: 6.6 g/dL — CL (ref 12.0–15.0)
LYMPHS ABS: 1.7 10*3/uL (ref 0.7–4.0)
Lymphocytes Relative: 10 %
MCH: 24.6 pg — AB (ref 26.0–34.0)
MCHC: 31.7 g/dL (ref 30.0–36.0)
MCV: 77.6 fL — AB (ref 78.0–100.0)
MONO ABS: 0.7 10*3/uL (ref 0.1–1.0)
Monocytes Relative: 4 %
Neutro Abs: 13.3 10*3/uL — ABNORMAL HIGH (ref 1.7–7.7)
Neutrophils Relative %: 80 %
PLATELETS: 498 10*3/uL — AB (ref 150–400)
RBC: 2.68 MIL/uL — AB (ref 3.87–5.11)
RDW: 25.7 % — AB (ref 11.5–15.5)
WBC: 16.7 10*3/uL — AB (ref 4.0–10.5)

## 2016-06-25 LAB — BASIC METABOLIC PANEL
Anion gap: 8 (ref 5–15)
BUN: 7 mg/dL (ref 6–20)
CO2: 19 mmol/L — ABNORMAL LOW (ref 22–32)
Calcium: 8.6 mg/dL — ABNORMAL LOW (ref 8.9–10.3)
Chloride: 108 mmol/L (ref 101–111)
Creatinine, Ser: 0.66 mg/dL (ref 0.44–1.00)
GFR calc Af Amer: >60 mL/min (ref >60)
GLUCOSE: 89 mg/dL (ref 65–99)
Potassium: 4 mmol/L (ref 3.5–5.1)
Sodium: 135 mmol/L (ref 135–145)

## 2016-06-25 LAB — PREPARE RBC (CROSSMATCH)

## 2016-06-25 MED ORDER — SODIUM CHLORIDE 0.9 % IV SOLN: Freq: Once | INTRAVENOUS | Status: DC

## 2016-06-25 NOTE — Progress Notes (Signed)
Pt hgb 6.6 critical lab informed pt and on call MD Dr. Olevia Bowens still waiting for new orders.

## 2016-06-25 NOTE — Progress Notes (Signed)
[facility] Disease   Reason for visit: Follow up on leukocytosis  Interval History: WBC continues to improve, no fever; knee today more edema, more pain  Physical Exam: Constitutional:  Vitals:   06/25/16 1256 06/25/16 1315  BP: 112/63 (!) 114/43  Pulse: 79 84  Resp: 20 18  Temp: 98.2 F (36.8 C) 98.2 F (36.8 C)   patient appears in NAD Respiratory: Normal respiratory effort; CTA B Cardiovascular: RRR MS: left knee with increased edema, no significant warmth  Review of Systems: Constitutional: negative for fevers and chills Respiratory: positive for cough, negative for sputum or hemoptysis Gastrointestinal: negative for diarrhea  Lab Results  Component Value Date   WBC 16.7 (H) 06/25/2016   HGB 6.6 (LL) 06/25/2016   HCT 20.8 (L) 06/25/2016   MCV 77.6 (L) 06/25/2016   PLT 498 (H) 06/25/2016    Lab Results  Component Value Date   CREATININE 0.66 06/25/2016   BUN 7 06/25/2016   NA 135 06/25/2016   K 4.0 06/25/2016   CL 108 06/25/2016   CO2 19 (L) 06/25/2016    Lab Results  Component Value Date   ALT 50 06/24/2016   AST 38 06/24/2016   ALKPHOS 360 (H) 06/24/2016     Microbiology: Recent Results (from the past 240 hour(s))  Blood Culture (routine x 2)     Status: None (Preliminary result)   Collection Time: 06/23/16  6:15 PM  Result Value Ref Range Status   Specimen Description BLOOD LEFT WRIST  Final   Special Requests IN PEDIATRIC BOTTLE 3CC  Final   Culture NO GROWTH 2 DAYS  Final   Report Status PENDING  Incomplete  Blood Culture (routine x 2)     Status: None (Preliminary result)   Collection Time: 06/23/16  6:28 PM  Result Value Ref Range Status   Specimen Description BLOOD LEFT ARM  Final   Special Requests BOTTLES DRAWN AEROBIC AND ANAEROBIC 5CC  Final   Culture NO GROWTH 2 DAYS  Final   Report Status PENDING  Incomplete  Urine culture     Status: Abnormal   Collection Time: 06/24/16  1:10 AM  Result Value Ref Range  Status   Specimen Description URINE, RANDOM  Final   Special Requests NONE  Final   Culture <10,000 COLONIES/mL INSIGNIFICANT GROWTH (A)  Final   Report Status 06/25/2016 FINAL  Final  Respiratory Panel by PCR     Status: None   Collection Time: 06/24/16  3:41 PM  Result Value Ref Range Status   Adenovirus NOT DETECTED NOT DETECTED Final   Coronavirus 229E NOT DETECTED NOT DETECTED Final   Coronavirus HKU1 NOT DETECTED NOT DETECTED Final   Coronavirus NL63 NOT DETECTED NOT DETECTED Final   Coronavirus OC43 NOT DETECTED NOT DETECTED Final   Metapneumovirus NOT DETECTED NOT DETECTED Final   Rhinovirus / Enterovirus NOT DETECTED NOT DETECTED Final   Influenza A NOT DETECTED NOT DETECTED Final   Influenza B NOT DETECTED NOT DETECTED Final   Parainfluenza Virus 1 NOT DETECTED NOT DETECTED Final   Parainfluenza Virus 2 NOT DETECTED NOT DETECTED Final   Parainfluenza Virus 3 NOT DETECTED NOT DETECTED Final   Parainfluenza Virus 4 NOT DETECTED NOT DETECTED Final   Respiratory Syncytial Virus NOT DETECTED NOT DETECTED Final   Bordetella pertussis NOT DETECTED NOT DETECTED Final   Chlamydophila pneumoniae NOT DETECTED NOT DETECTED Final   Mycoplasma pneumoniae NOT DETECTED NOT DETECTED Final    Impression/Plan:  1. Cough -  possible viral pneumonia.  Viral panel negative but not c/w bacterial pneumonia.  Multifocal so doubt atypical either. I will stop cefepime. 2.  Leukocytosis - improved since starting vancomycin for her knee.  Some increased swelling.  I would recommend orthopedic reevaluation tomorrow for ? Aspiration of knee or need for another washout.

## 2016-06-25 NOTE — Progress Notes (Signed)
PROGRESS NOTE  Angie Wright  [id number] DOB: September 12, 1955  DOA: 06/23/2016 PCP: No PCP Per Patient   Brief Narrative:  Angie Wright is a 61 y.o. female with multiple medical history including but not limited to history of Iron Deficiency anemia of left Knee Septic Arthritis status post debridement done in December by Dr. Rolena Infante on 06/01/2016, with a positive culture for Strep Pneumo, currently receiving antibiotic Rocephin IV 2 g daily at home.  She presented with 2 day history of progressively worsening dry cough as dissected with generalized weakness without fever or chills, no skin changes, no worsening knee pain or swelling or redness, no dysuria or hematuria, no headache or neck stiffness. She also ha has some acute on chronic back pain which was mild to moderate.. She has been compliant to her antibiotic regimen at home.  She was referred to the ED from the office on account of elevated WBC Of up to 30,000 with a triple hemoclipped from 9 to 7. At the ED patient had a temp of 99.29F with mild tachycardia 108 b/min, and her hemoglobin had dropped further from 7 to 6.2. Chest x-ray has shown bilateral lower opacifications consistent with pneumonia. She was given IV antibiotic vancomycin and cefepime, cultures were drawn and admitted for further management Assessment & Plan:   Principal Problem:   Leukocytosis Active Problems:   Septic arthritis of knee, left (HCC)   Anemia   Pneumonia 1 Pneumonia: Antibiotics, antitussives, cultures obtained Flu negative. Felt to be viral in etiology rather than bacterial per ID  2. Septic Arthritis: Seems stable. Consider sedimentation rate and orthopedic follow-up X-ray of the knee pending  3. Leukocytosis: Etiology infectious -gross #1 and 2. ID consulted and following. Improving-follow CBCs. Current antibiotics and follow clinically  4. Iron deficiency anemia: Status post PRBC transfusion on admission. Additional PRBC transfusion  ongoing. No obvious clinical evidence ongoing acute blood loss . Follow FOBT and CBC Consider GI evaluation.       DVT prophylaxis: Lovenox Code Status: Full code Family Communication: Husband and daughter at bedside Disposition Plan: Home  Consultants:  Infectious disease  Procedures:  None Antimicrobials:  Vancomycin- cefepime discontinued by infectious disease today   Subjective: Cough is improving and so is her back pain. No fever or chills  Objective:  Vitals:   06/25/16 1019 06/25/16 1206 06/25/16 1256 06/25/16 1315  BP: (!) 118/57 (!) 117/46 112/63 (!) 114/43  Pulse: 100 83 79 84  Resp: 20 18 20 18   Temp: 98.1 F (36.7 C) 98.4 F (36.9 C) 98.2 F (36.8 C) 98.2 F (36.8 C)  TempSrc: Oral Oral Oral Oral  SpO2: 100% 100% 100% 100%    Intake/Output Summary (Last 24 hours) at 06/25/16 1520 Last data filed at 06/25/16 1400  Gross per 24 hour  Intake          4705.08 ml  Output              450 ml  Net          4255.08 ml   There were no vitals filed for this visit.  Examination:  General exam: Resting comfortably not in acute distress Respiratory system: . Few occasional left basilar rhonchi otherwise unremarkable Respiratory effort normal. Cardiovascular system: S1 & S2 heard, RRR. No JVD, murmurs, rubs, gallops or clicks. No pedal edema. Gastrointestinal system: Abdomen is nondistended, soft and nontender. No organomegaly or masses felt. Normal bowel sounds heard. Central nervous system: Alert and oriented. No focal neurological deficits.  Extremities: Symmetric 5 x 5 power, left knee swelling without any redness not warm to touch range of motion slightly limited by pain Skin: No rashes, lesions or ulcers Psychiatry: Judgement and insight appear normal. Mood & affect appropriate.     Data Reviewed: I have personally reviewed following labs and imaging studies  CBC:  Recent Labs Lab 06/23/16 1828 06/23/16 2130 06/24/16 0647 06/25/16 0410    WBC 23.4* 21.6* 19.8* 16.7*  NEUTROABS 20.8* 18.6*  --  13.3*  HGB 7.0* 6.2* 7.0* 6.6*  HCT 21.7* 19.3* 22.1* 20.8*  MCV 75.3* 74.5* 76.7* 77.6*  PLT 526* 478* 499* [id number]*   Basic Metabolic Panel:  Recent Labs Lab 06/23/16 1828 06/24/16 0647 06/25/16 0410  NA 132* 133* 135  K 4.0 4.0 4.0  CL 99* 102 108  CO2 23 22 19*  GLUCOSE 116* 100* 89  BUN 11 9 7   CREATININE 0.68 0.59 0.66  CALCIUM 9.5 9.0 8.6*   GFR: CrCl cannot be calculated (Unknown ideal weight.). Liver Function Tests:  Recent Labs Lab 06/23/16 1828 06/24/16 0647  AST 57* 38  ALT 66* 50  ALKPHOS 486* 360*  BILITOT 0.9 0.8  PROT 8.2* 7.1  ALBUMIN 2.1* 1.8*   No results for input(s): LIPASE, AMYLASE in the last 168 hours. No results for input(s): AMMONIA in the last 168 hours. Coagulation Profile: No results for input(s): INR, PROTIME in the last 168 hours. Cardiac Enzymes: No results for input(s): CKTOTAL, CKMB, CKMBINDEX, TROPONINI in the last 168 hours. BNP (last 3 results) No results for input(s): PROBNP in the last 8760 hours. HbA1C: No results for input(s): HGBA1C in the last 72 hours. CBG: No results for input(s): GLUCAP in the last 168 hours. Lipid Profile: No results for input(s): CHOL, HDL, LDLCALC, TRIG, CHOLHDL, LDLDIRECT in the last 72 hours. Thyroid Function Tests: No results for input(s): TSH, T4TOTAL, FREET4, T3FREE, THYROIDAB in the last 72 hours. Anemia Panel: No results for input(s): VITAMINB12, FOLATE, FERRITIN, TIBC, IRON, RETICCTPCT in the last 72 hours.  Sepsis Labs:  Recent Labs Lab 06/23/16 1828 06/23/16 1849 06/23/16 2130 06/24/16 0647 06/25/16 0410  WBC 23.4*  --  21.6* 19.8* 16.7*  LATICACIDVEN  --  0.95  --   --   --     Recent Results (from the past 240 hour(s))  Blood Culture (routine x 2)     Status: None (Preliminary result)   Collection Time: 06/23/16  6:15 PM  Result Value Ref Range Status   Specimen Description BLOOD LEFT WRIST  Final   Special  Requests IN PEDIATRIC BOTTLE 3CC  Final   Culture NO GROWTH 2 DAYS  Final   Report Status PENDING  Incomplete  Blood Culture (routine x 2)     Status: None (Preliminary result)   Collection Time: 06/23/16  6:28 PM  Result Value Ref Range Status   Specimen Description BLOOD LEFT ARM  Final   Special Requests BOTTLES DRAWN AEROBIC AND ANAEROBIC 5CC  Final   Culture NO GROWTH 2 DAYS  Final   Report Status PENDING  Incomplete  Urine culture     Status: Abnormal   Collection Time: 06/24/16  1:10 AM  Result Value Ref Range Status   Specimen Description URINE, RANDOM  Final   Special Requests NONE  Final   Culture <10,000 COLONIES/mL INSIGNIFICANT GROWTH (A)  Final   Report Status 06/25/2016 FINAL  Final  Respiratory Panel by PCR     Status: None   Collection Time: 06/24/16  3:41  PM  Result Value Ref Range Status   Adenovirus NOT DETECTED NOT DETECTED Final   Coronavirus 229E NOT DETECTED NOT DETECTED Final   Coronavirus HKU1 NOT DETECTED NOT DETECTED Final   Coronavirus NL63 NOT DETECTED NOT DETECTED Final   Coronavirus OC43 NOT DETECTED NOT DETECTED Final   Metapneumovirus NOT DETECTED NOT DETECTED Final   Rhinovirus / Enterovirus NOT DETECTED NOT DETECTED Final   Influenza A NOT DETECTED NOT DETECTED Final   Influenza B NOT DETECTED NOT DETECTED Final   Parainfluenza Virus 1 NOT DETECTED NOT DETECTED Final   Parainfluenza Virus 2 NOT DETECTED NOT DETECTED Final   Parainfluenza Virus 3 NOT DETECTED NOT DETECTED Final   Parainfluenza Virus 4 NOT DETECTED NOT DETECTED Final   Respiratory Syncytial Virus NOT DETECTED NOT DETECTED Final   Bordetella pertussis NOT DETECTED NOT DETECTED Final   Chlamydophila pneumoniae NOT DETECTED NOT DETECTED Final   Mycoplasma pneumoniae NOT DETECTED NOT DETECTED Final         Radiology Studies: Dg Chest 2 View  Result Date: 06/23/2016 CLINICAL DATA:  Elevated WBC, cough EXAM: CHEST  2 VIEW COMPARISON:  03/31/2016 FINDINGS: Right-sided PICC line  with the tip projecting over the lower SVC. Bilateral trace pleural effusions. Left lower lobe airspace disease. Minimal right lower lobe airspace disease. Bilateral interstitial thickening. No pneumothorax. Stable cardiomegaly. Moderate osteoarthritis of bilateral glenohumeral joints. IMPRESSION: 1. Right-sided PICC line with the tip projecting over the lower SVC. 2. Bilateral interstitial thickening and bilateral lower lobe airspace disease which may reflect interstitial edema and atelectasis versus multilobar pneumonia. Electronically Signed   By: Kathreen Devoid   On: 06/23/2016 17:50        Scheduled Meds: . sodium chloride   Intravenous Once  . sodium chloride   Intravenous Once  . vancomycin  1,000 mg Intravenous Q12H   Continuous Infusions: . sodium chloride Stopped (06/25/16 0955)     LOS: 1 day    Time spent: 66 min    [email or web address],Temesgen Weightman, MD [facility] Pager [phone]  If 7PM-7AM, please contact night-coverage [email or web address] Password [email or web address] 06/25/2016, 3:20 PM

## 2016-06-26 ENCOUNTER — Inpatient Hospital Stay (HOSPITAL_COMMUNITY): Payer: BLUE CROSS/BLUE SHIELD

## 2016-06-26 DIAGNOSIS — M00062 Staphylococcal arthritis, left knee: Secondary | ICD-10-CM

## 2016-06-26 LAB — CBC WITH DIFFERENTIAL/PLATELET
BASOS PCT: 1 %
Basophils Absolute: 0.1 10*3/uL (ref 0.0–0.1)
EOS ABS: 0.5 10*3/uL (ref 0.0–0.7)
Eosinophils Relative: 4 %
HCT: 27.5 % — ABNORMAL LOW (ref 36.0–46.0)
HEMOGLOBIN: 9 g/dL — AB (ref 12.0–15.0)
LYMPHS PCT: 6 %
Lymphs Abs: 0.7 10*3/uL (ref 0.7–4.0)
MCH: 25.8 pg — AB (ref 26.0–34.0)
MCHC: 32.7 g/dL (ref 30.0–36.0)
MCV: 78.8 fL (ref 78.0–100.0)
MONO ABS: 0.5 10*3/uL (ref 0.1–1.0)
Monocytes Relative: 4 %
Neutro Abs: 10.5 10*3/uL — ABNORMAL HIGH (ref 1.7–7.7)
Neutrophils Relative %: 85 %
PLATELETS: 447 10*3/uL — AB (ref 150–400)
RBC: 3.49 MIL/uL — AB (ref 3.87–5.11)
RDW: 22.9 % — ABNORMAL HIGH (ref 11.5–15.5)
WBC: 12.3 10*3/uL — AB (ref 4.0–10.5)

## 2016-06-26 LAB — BASIC METABOLIC PANEL
Anion gap: 6 (ref 5–15)
BUN: 5 mg/dL — AB (ref 6–20)
CHLORIDE: 106 mmol/L (ref 101–111)
CO2: 23 mmol/L (ref 22–32)
Calcium: 8.7 mg/dL — ABNORMAL LOW (ref 8.9–10.3)
Creatinine, Ser: 0.58 mg/dL (ref 0.44–1.00)
Glucose, Bld: 122 mg/dL — ABNORMAL HIGH (ref 65–99)
POTASSIUM: 3.7 mmol/L (ref 3.5–5.1)
SODIUM: 135 mmol/L (ref 135–145)

## 2016-06-26 LAB — RETICULOCYTES
RBC.: 3.43 MIL/uL — ABNORMAL LOW (ref 3.87–5.11)
RETIC COUNT ABSOLUTE: 161.2 10*3/uL (ref 19.0–186.0)
Retic Ct Pct: 4.7 % — ABNORMAL HIGH (ref 0.4–3.1)

## 2016-06-26 LAB — FOLATE: FOLATE: 14.5 ng/mL (ref >5.9)

## 2016-06-26 LAB — IRON AND TIBC
IRON: 19 ug/dL — AB (ref 28–170)
Saturation Ratios: 14 % (ref 10.4–31.8)
TIBC: 132 ug/dL — AB (ref 250–450)
UIBC: 113 ug/dL

## 2016-06-26 LAB — FERRITIN: FERRITIN: 976 ng/mL — AB (ref 11–307)

## 2016-06-26 LAB — PREPARE RBC (CROSSMATCH)

## 2016-06-26 LAB — VITAMIN B12: VITAMIN B 12: 1615 pg/mL — AB (ref 180–914)

## 2016-06-26 MED ORDER — SODIUM CHLORIDE 0.9 % IV SOLN
Freq: Once | INTRAVENOUS | Status: AC
  Administered 2016-06-26: 1 mL via INTRAVENOUS

## 2016-06-26 MED ORDER — DEXTROSE 5 % IV SOLN
INTRAVENOUS | Status: AC
  Administered 2016-06-26: 09:00:00 via INTRAVENOUS

## 2016-06-26 MED ORDER — SODIUM CHLORIDE 0.9% FLUSH
10.0000 mL | INTRAVENOUS | Status: DC | PRN
  Administered 2016-06-27 – 2016-06-29 (×3): 10 mL
  Filled 2016-06-26 (×3): qty 40

## 2016-06-26 MED ORDER — FUROSEMIDE 10 MG/ML IJ SOLN
40.0000 mg | Freq: Once | INTRAMUSCULAR | Status: AC
  Administered 2016-06-26: 40 mg via INTRAVENOUS
  Filled 2016-06-26: qty 4

## 2016-06-26 MED ORDER — HEPARIN SODIUM (PORCINE) 5000 UNIT/ML IJ SOLN
5000.0000 [IU] | Freq: Three times a day (TID) | INTRAMUSCULAR | Status: DC
  Administered 2016-06-26 – 2016-06-29 (×7): 5000 [IU] via SUBCUTANEOUS
  Filled 2016-06-26 (×7): qty 1

## 2016-06-26 NOTE — Progress Notes (Signed)
Pharmacy Antibiotic Note  Angie Wright is a 61 y.o. female admitted on 06/23/2016 with pneumonia and possible septic arthritis.  Pharmacy has been consulted for vancomycin dosing.   Plan: - Continue Vanc 1g IV Q12H - Monitor renal fxn, clinical progress, vanc trough tomorrow   Weight: 171 lb 15.3 oz (78 kg)  Temp (24hrs), Avg:98.4 F (36.9 C), Min:98.2 F (36.8 C), Max:98.7 F (37.1 C)   Recent Labs Lab 06/23/16 1828 06/23/16 1849 06/23/16 2130 06/24/16 0647 06/25/16 0410 06/26/16 0700  WBC 23.4*  --  21.6* 19.8* 16.7* 12.3*  CREATININE 0.68  --   --  0.59 0.66 0.58  LATICACIDVEN  --  0.95  --   --   --   --     Estimated Creatinine Clearance: 77.9 mL/min (by C-G formula based on SCr of 0.58 mg/dL).    No Known Allergies   Antimicrobials this admission:  Cefepime 1/19 >> 1/21 Vanc 1/19 >>  Dose adjustments this admission:  N/A  Microbiology results:  1/19 BCx x2 - NGTD (BCID negative) 1/20 UCx - negative   Tanysha Quant D. Mina Marble, PharmD, BCPS Pager:  [phone] 06/26/2016, 12:26 PM

## 2016-06-26 NOTE — Consult Note (Signed)
ORTHOPAEDIC CONSULTATION  REQUESTING PHYSICIAN: Thurnell Lose, MD  PCP:  No PCP Per Patient  Chief Complaint: Left knee pain/swelling  HPI: Angie Wright is a 61 y.o. female who was admitted for workup of leukocytosis.  Briefly she has a recent history of septic arthritis of the left knee treated with arthroscopic I&D by my partner Dr. Rolena Infante.  She has been getting IV antibiotics over the course of the last few weeks and seeing overall improvement. On her last lab draw she did have a leukocytosis in the 30,000 range. She has been admitted for evaluation and treatment of that and placed on vancomycin empirically. She has recent history of cough and some shortness of breath, and consideration of pneumonia has been worked up and evaluated by infectious disease and the hospitalist team.    Currently the leukocytosis is improving with vancomycin however the knee edema persists. I been consulted to evaluate for possible incomplete treatment of the septic knee arthritis.    Past Medical History:  Diagnosis Date  . Anemia    states "sickle cell trait, HGB always runs between 7 and 8"   Past Surgical History:  Procedure Laterality Date  . KNEE ARTHROSCOPY Left 06/01/2016   Procedure: ARTHROSCOPIC WASHOUT LEFT KNEE;  Surgeon: Melina Schools, MD;  Location: [facility];  Service: Orthopedics;  Laterality: Left;   Social History   Social History  . Marital status: Married    Spouse name: N/A  . Number of children: N/A  . Years of education: N/A   Social History Main Topics  . Smoking status: Never Smoker  . Smokeless tobacco: Never Used  . Alcohol use No  . Drug use: No  . Sexual activity: Not Asked   Other Topics Concern  . None   Social History Narrative  . None   Family History  Problem Relation Age of Onset  . Sickle cell anemia Brother     2 brothers passed with sickle cell anemia   No Known Allergies Prior to Admission medications   Medication Sig Start Date End Date  Taking? Authorizing Provider  acetaminophen (TYLENOL) 500 MG tablet Take 1,000 mg by mouth every 6 (six) hours as needed for headache (pain).   Yes Historical Provider, MD  calcium carbonate (OS-CAL - DOSED IN MG OF ELEMENTAL CALCIUM) 1250 (500 Ca) MG tablet Take 1 tablet by mouth daily with breakfast.   Yes Historical Provider, MD  cefTRIAXone 2 g in dextrose 5 % 50 mL Inject 2 g into the vein daily. 2gm IV Q24 hours, Continue IV ceftriaxone until 1/26 06/05/16  Yes Domenic Polite, MD  cholecalciferol (VITAMIN D) 1000 units tablet Take 1,000 Units by mouth daily.   Yes Historical Provider, MD  HYDROcodone-acetaminophen (NORCO) 7.5-325 MG tablet Take 1 tablet by mouth every 6 (six) hours as needed for moderate pain. 06/05/16  Yes Domenic Polite, MD  Multiple Vitamin (MULTIVITAMIN WITH MINERALS) TABS tablet Take 1 tablet by mouth daily.   Yes Historical Provider, MD  polyethylene glycol (MIRALAX / GLYCOLAX) packet Take 17 g by mouth daily as needed for moderate constipation. Patient taking differently: Take 17 g by mouth daily as needed for moderate constipation. Mix in 8 oz fluid and drink 06/05/16  Yes Domenic Polite, MD  potassium chloride SA (K-DUR,KLOR-CON) 20 MEQ tablet Take 1 tablet (20 mEq total) by mouth daily. 06/05/16  Yes Domenic Polite, MD   Dg Chest 2 View  Result Date: 06/26/2016 CLINICAL DATA:  Cough and congestion. EXAM: CHEST  2 VIEW COMPARISON:  06/23/2016. FINDINGS: Right PICC line noted with tip at cavoatrial junction. Heart size normal. Diffuse bilateral interstitial prominence again noted. Findings suggest interstitial edema and/or interstitial pneumonitis. Bibasilar subsegmental atelectasis. Tiny bilateral pleural effusions. No pneumothorax . IMPRESSION: 1. Right PICC line stable position. 2. Diffuse bilateral from interstitial prominence again noted. Bibasilar subsegmental atelectasis. Findings suggest interstitial edema and/or pneumonitis. Tiny bilateral pleural effusions again noted .  Electronically Signed   By: Marcello Moores  Register   On: 06/26/2016 10:33   Dg Knee 1-2 Views Left  Result Date: 06/25/2016 CLINICAL DATA:  Pain and swelling of the left knee. Knee surgery few weeks ago. EXAM: LEFT KNEE - 1-2 VIEW COMPARISON:  06/01/2016 FINDINGS: Tricompartmental osteoarthritis with joint space narrowing and spurring as before without acute fracture nor bone destruction. No intra-articular loose bodies. Suprapatellar surgical drain has been removed since prior comparison. Suprapatellar moderate to large joint effusion. 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 No acute osseous abnormality or bone destruction. Electronically Signed   By: Ashley Royalty M.D.   On: 06/25/2016 23:14    Positive ROS: All other systems have been reviewed and were otherwise negative with the exception of those mentioned in the HPI and as above.  Physical Exam: General: Alert, no acute distress Cardiovascular: No pedal edema Respiratory: No cyanosis, no use of accessory musculature GI: No organomegaly, abdomen is soft and non-tender Skin: No lesions in the area of chief complaint Neurologic: Sensation intact distally Psychiatric: Patient is competent for consent with normal mood and affect Lymphatic: No axillary or cervical lymphadenopathy  MUSCULOSKELETAL:  Left knee exam- there is very articular edema in the subcutaneous tissue may be a small to mild knee joint effusion appreciated. The knee is non-warm non-erythematous nonpainful to passive motion. She has good quad activation. Neurovascular intact.  Assessment: Recent history of septic left knee arthritis. Left knee edema.  Plan: -At this time I do not feel strongly that there is a large knee joint effusion certainly there may be a small one. A lot of the edema appears to be in the subcutaneous tissue around the knee joint as opposed in the knee joint. Therefore I will forego arthrocentesis to avoid seeding the  knee joint with any potential superficial saline's. -Recommendation is for MRI of left knee to evaluate for large knee effusion.  I'll follow up in the MRI if she has a appreciable effusion in the knee joint I would recommend empiric surgical treatment with arthroscopic irrigation and debridement of soft synovial tissue. -Please keep her nothing by mouth at midnight starting tonight for potential surgical care and treatment tomorrow. -Will follow   Nicholes Stairs, MD Cell [phone]    06/26/2016 12:41 PM

## 2016-06-26 NOTE — Progress Notes (Signed)
Pt was given 2 units of PRBC for hgb 6.6 for repeat CBC today, no active bleeding noted.

## 2016-06-26 NOTE — Progress Notes (Signed)
PROGRESS NOTE                                                                                                                                                                                                             Patient Demographics:    Angie Wright, is a 61 y.o. female, DOB - [birth year], RR:[id number]  Admit date - 06/23/2016   Admitting Physician Edwin Dada, MD  Outpatient Primary MD for the patient is No PCP Per Patient  LOS - 2  Chief Complaint  Patient presents with  . Abnormal Lab       Brief Narrative  Angie Wright is a 61 y.o. female with a past medical history significant for iron deficiency anemia and recent S pneumo septic arthritis of hte LEFT knee . The patient was admitted last Dec with LEFT knee swelling, found to have septic arthritis, debrided by Dr. Rolena Infante Dec 28th, growing S pneumo.  Discharged on ceftriaxone 2g daily, which she has been getting at home via right arm PICC line. She was readmitted feeling poorly, worsening swelling of the left knee and possible pneumonia.   Subjective:    Angie Wright today has, No headache, No chest pain, No abdominal pain - No Nausea, No new weakness tingling or numbness, No Cough - SOB. Moderate L.Knee pain   Assessment  & Plan :     1.Left knee septic arthritis. Status post debridement on 06/01/2016 by Dr. Rolena Infante, she has been on IV Rocephin via PICC line at home since then. Now swelling getting worse, does have leukocytosis, exam consistent with partially treated septic arthritis. Ortho Evra as been very consulted on 06/26/2016, Will monitor. Appreciate ID input.  2. Possible pneumonia. Clinically ruled out.  3. History of iron deficiency anemia. Worse likely due to perioperative blood loss, transfused 2 units on 06/25/2016, stable H&H, monitor. We will place her on iron prior to discharge. She received IV iron recently.    Family Communication   :  family  Code Status : Full  Diet : Diet NPO time specified Except for: Sips with Meds    Disposition Plan  :  TBD  Consults  :  ID, Ortho  Procedures  :     DVT Prophylaxis  :   Heparin    Lab Results  Component Value Date   PLT  447 (H) 06/26/2016    Inpatient Medications  Scheduled Meds: . vancomycin  1,000 mg Intravenous Q12H   Continuous Infusions: . dextrose 50 mL/hr at 06/26/16 0833   PRN Meds:.acetaminophen **OR** acetaminophen, chlorpheniramine-HYDROcodone, HYDROcodone-acetaminophen, ondansetron **OR** ondansetron (ZOFRAN) IV, polyethylene glycol  Antibiotics  :    Anti-infectives    Start     Dose/Rate Route Frequency Ordered Stop   06/24/16 0300  ceFEPIme (MAXIPIME) 1 g in dextrose 5 % 50 mL IVPB  Status:  Discontinued     1 g 100 mL/hr over 30 Minutes Intravenous Every 8 hours 06/24/16 0143 06/25/16 1508   06/23/16 1845  ceFEPIme (MAXIPIME) 1 g in dextrose 5 % 50 mL IVPB     1 g 100 mL/hr over 30 Minutes Intravenous  Once 06/23/16 1818 06/23/16 2015   06/23/16 1830  vancomycin (VANCOCIN) IVPB 1000 mg/200 mL premix     1,000 mg 200 mL/hr over 60 Minutes Intravenous Every 12 hours 06/23/16 1825           Objective:   Vitals:   06/25/16 1256 06/25/16 1315 06/25/16 1515 06/26/16 0556  BP: 112/63 (!) 114/43 (!) 121/46 123/60  Pulse: 79 84 82 84  Resp: 20 18 18 18   Temp: 98.2 F (36.8 C) 98.2 F (36.8 C) 98.7 F (37.1 C) 98.5 F (36.9 C)  TempSrc: Oral Oral Oral Oral  SpO2: 100% 100% 100% 100%    Wt Readings from Last 3 Encounters:  06/01/16 78 kg (172 lb)     Intake/Output Summary (Last 24 hours) at 06/26/16 0926 Last data filed at 06/26/16 0600  Gross per 24 hour  Intake          3886.75 ml  Output             1550 ml  Net          2336.75 ml     Physical Exam  Awake Alert, Oriented X 3, No new F.N deficits, Normal affect [place].AT,PERRAL Supple Neck,No JVD, No cervical lymphadenopathy appriciated.  Symmetrical Chest wall movement,  Good air movement bilaterally, CTAB RRR,No Gallops,Rubs or new Murmurs, No Parasternal Heave +ve B.Sounds, Abd Soft, No tenderness, No organomegaly appriciated, No rebound - guarding or rigidity. No Cyanosis, Clubbing or edema, No new Rash or bruise ,  L knee has ++ effusion mild warmth, mildly painful passive ROM    Data Review:    CBC  Recent Labs Lab 06/23/16 1828 06/23/16 2130 06/24/16 0647 06/25/16 0410 06/26/16 0700  WBC 23.4* 21.6* 19.8* 16.7* 12.3*  HGB 7.0* 6.2* 7.0* 6.6* 9.0*  HCT 21.7* 19.3* 22.1* 20.8* 27.5*  PLT 526* 478* 499* 498* 447*  MCV 75.3* 74.5* 76.7* 77.6* 78.8  MCH 24.3* 23.9* 24.3* 24.6* 25.8*  MCHC 32.3 32.1 31.7 31.7 32.7  RDW 26.2* 25.9* 25.3* 25.7* 22.9*  LYMPHSABS 1.4 2.2  --  1.7 0.7  MONOABS 0.7 0.2  --  0.7 0.5  EOSABS 0.5 0.6  --  0.8* 0.5  BASOSABS 0.0 0.0  --  0.2* 0.1    Chemistries   Recent Labs Lab 06/23/16 1828 06/24/16 0647 06/25/16 0410 06/26/16 0700  NA 132* 133* 135 135  K 4.0 4.0 4.0 3.7  CL 99* 102 108 106  CO2 23 22 19* 23  GLUCOSE 116* 100* 89 122*  BUN 11 9 7  5*  CREATININE 0.68 0.59 0.66 0.58  CALCIUM 9.5 9.0 8.6* 8.7*  AST 57* 38  --   --   ALT 66* 50  --   --  ALKPHOS 486* 360*  --   --   BILITOT 0.9 0.8  --   --    ------------------------------------------------------------------------------------------------------------------ No results for input(s): CHOL, HDL, LDLCALC, TRIG, CHOLHDL, LDLDIRECT in the last 72 hours.  No results found for: HGBA1C ------------------------------------------------------------------------------------------------------------------ No results for input(s): TSH, T4TOTAL, T3FREE, THYROIDAB in the last 72 hours.  Invalid input(s): FREET3 ------------------------------------------------------------------------------------------------------------------ No results for input(s): VITAMINB12, FOLATE, FERRITIN, TIBC, IRON, RETICCTPCT in the last 72 hours.  Coagulation profile No  results for input(s): INR, PROTIME in the last 168 hours.  No results for input(s): DDIMER in the last 72 hours.  Cardiac Enzymes No results for input(s): CKMB, TROPONINI, MYOGLOBIN in the last 168 hours.  Invalid input(s): CK ------------------------------------------------------------------------------------------------------------------ No results found for: BNP  Micro Results Recent Results (from the past 240 hour(s))  Blood Culture (routine x 2)     Status: None (Preliminary result)   Collection Time: 06/23/16  6:15 PM  Result Value Ref Range Status   Specimen Description BLOOD LEFT WRIST  Final   Special Requests IN PEDIATRIC BOTTLE 3CC  Final   Culture NO GROWTH 2 DAYS  Final   Report Status PENDING  Incomplete  Blood Culture (routine x 2)     Status: None (Preliminary result)   Collection Time: 06/23/16  6:28 PM  Result Value Ref Range Status   Specimen Description BLOOD LEFT ARM  Final   Special Requests BOTTLES DRAWN AEROBIC AND ANAEROBIC 5CC  Final   Culture NO GROWTH 2 DAYS  Final   Report Status PENDING  Incomplete  Urine culture     Status: Abnormal   Collection Time: 06/24/16  1:10 AM  Result Value Ref Range Status   Specimen Description URINE, RANDOM  Final   Special Requests NONE  Final   Culture <10,000 COLONIES/mL INSIGNIFICANT GROWTH (A)  Final   Report Status 06/25/2016 FINAL  Final  Respiratory Panel by PCR     Status: None   Collection Time: 06/24/16  3:41 PM  Result Value Ref Range Status   Adenovirus NOT DETECTED NOT DETECTED Final   Coronavirus 229E NOT DETECTED NOT DETECTED Final   Coronavirus HKU1 NOT DETECTED NOT DETECTED Final   Coronavirus NL63 NOT DETECTED NOT DETECTED Final   Coronavirus OC43 NOT DETECTED NOT DETECTED Final   Metapneumovirus NOT DETECTED NOT DETECTED Final   Rhinovirus / Enterovirus NOT DETECTED NOT DETECTED Final   Influenza A NOT DETECTED NOT DETECTED Final   Influenza B NOT DETECTED NOT DETECTED Final   Parainfluenza  Virus 1 NOT DETECTED NOT DETECTED Final   Parainfluenza Virus 2 NOT DETECTED NOT DETECTED Final   Parainfluenza Virus 3 NOT DETECTED NOT DETECTED Final   Parainfluenza Virus 4 NOT DETECTED NOT DETECTED Final   Respiratory Syncytial Virus NOT DETECTED NOT DETECTED Final   Bordetella pertussis NOT DETECTED NOT DETECTED Final   Chlamydophila pneumoniae NOT DETECTED NOT DETECTED Final   Mycoplasma pneumoniae NOT DETECTED NOT DETECTED Final    Radiology Reports Dg Chest 2 View  Result Date: 06/23/2016 CLINICAL DATA:  Elevated WBC, cough EXAM: CHEST  2 VIEW COMPARISON:  03/31/2016 FINDINGS: Right-sided PICC line with the tip projecting over the lower SVC. Bilateral trace pleural effusions. Left lower lobe airspace disease. Minimal right lower lobe airspace disease. Bilateral interstitial thickening. No pneumothorax. Stable cardiomegaly. Moderate osteoarthritis of bilateral glenohumeral joints. IMPRESSION: 1. Right-sided PICC line with the tip projecting over the lower SVC. 2. Bilateral interstitial thickening and bilateral lower lobe airspace disease which may reflect interstitial edema  and atelectasis versus multilobar pneumonia. Electronically Signed   By: Kathreen Devoid   On: 06/23/2016 17:50   Dg Chest 2 View  Result Date: 06/01/2016 CLINICAL DATA:  Acute onset of fever and disorientation. Initial encounter. EXAM: CHEST  2 VIEW COMPARISON:  None. FINDINGS: The lungs are well-aerated. Vascular congestion is noted. Minimal bilateral opacities may reflect atelectasis or possibly mild infection. There is no evidence of pleural effusion or pneumothorax. The heart is enlarged. No acute osseous abnormalities are seen. Chronic sclerosis is seen at the humeral heads bilaterally. IMPRESSION: Vascular congestion and cardiomegaly noted. Minimal bilateral opacities may reflect atelectasis or possibly mild infection. Electronically Signed   By: Garald Balding M.D.   On: 06/01/2016 00:03   Dg Knee 1-2 Views  Left  Result Date: 06/25/2016 CLINICAL DATA:  Pain and swelling of the left knee. Knee surgery few weeks ago. EXAM: LEFT KNEE - 1-2 VIEW COMPARISON:  06/01/2016 FINDINGS: Tricompartmental osteoarthritis with joint space narrowing and spurring as before without acute fracture nor bone destruction. No intra-articular loose bodies. Suprapatellar surgical drain has been removed since prior comparison. Suprapatellar moderate to large joint effusion. 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 No acute osseous abnormality or bone destruction. Electronically Signed   By: Ashley Royalty M.D.   On: 06/25/2016 23:14   Mr Foot Right W Wo Contrast  Result Date: 06/02/2016 CLINICAL DATA:  Right dorsal foot pain and swelling since Tuesday. No known injury. EXAM: MRI OF THE RIGHT FOREFOOT WITHOUT AND WITH CONTRAST TECHNIQUE: Multiplanar, multisequence MR imaging of the right foot was performed before and after the administration of intravenous contrast. CONTRAST:  3mL MULTIHANCE GADOBENATE DIMEGLUMINE 529 MG/ML IV SOLN COMPARISON:  05/31/2016 radiographs of the right foot FINDINGS: Bones/Joint/Cartilage Mild reactive marrow edema about the second TMT joint with joint space narrowing and subchondral cyst at the base of the second metatarsal are consistent with osteoarthritis. There is marrow edema involving the proximal second metatarsal shaft with faint hypointense linear foci potentially representing a the subtle stress fracture. No findings of acute osteomyelitis. Sub chondral cystic change of the medial and mid cuneiform are noted. Mild hallux valgus. Calcaneal enthesophyte along the plantar aspect. No acute abnormality of the plantar fascia nor Achilles. Ligaments Nonacute Muscles and Tendons The tendons crossing the ankle joint appear intact. Soft tissues Mild diffuse soft tissue swelling along the plantar dorsal aspect of the foot. IMPRESSION: Second TMT joint osteoarthritis with  subchondral degenerative cyst at the base of the second metatarsal as well as involving the medial and mid cuneiform. Reactive marrow edema about the 2nd TMT joint involving the second metatarsal and middle cuneiform. Further distally within the proximal second metatarsal shaft is marrow edema with minimal faint linear hypointensity seen on some of the images suggesting a possible stress fracture. No findings of acute osteomyelitis. Diffuse mild nonspecific soft tissue edema of the foot, query cellulitis. Electronically Signed   By: Ashley Royalty M.D.   On: 06/02/2016 20:27   Dg Knee Left Port  Result Date: 06/01/2016 CLINICAL DATA:  Patient status post debridement of the left knee with drain placement. EXAM: PORTABLE LEFT KNEE - 1-2 VIEW COMPARISON:  None. FINDINGS: Drainage catheter projects over the anterior knee. Tricompartmental osteoarthritis. Normal anatomic alignment. No evidence for acute fracture. IMPRESSION: Drain projects over the anterior knee soft tissues. Tricompartmental osteoarthritis. Electronically Signed   By: Lovey Newcomer M.D.   On: 06/01/2016 10:05   Dg Foot Complete Right  Result Date: 06/01/2016 CLINICAL DATA:  Acute onset of right dorsal foot pain and swelling. Initial encounter. EXAM: RIGHT FOOT COMPLETE - 3+ VIEW COMPARISON:  None. FINDINGS: There is no evidence of fracture or dislocation. Degenerative change is noted at the medial aspect of the midfoot. There is no evidence of talar subluxation; the subtalar joint is unremarkable in appearance. A plantar calcaneal spur is seen. No significant soft tissue abnormalities are seen. IMPRESSION: 1. No evidence of fracture or dislocation. 2. Degenerative change at the medial aspect of the midfoot. Electronically Signed   By: Garald Balding M.D.   On: 06/01/2016 00:04   US Abdomen Limited Ruq  Result Date: 06/01/2016 CLINICAL DATA:  Acute onset of fever.  Initial encounter. EXAM: US ABDOMEN LIMITED - RIGHT UPPER QUADRANT COMPARISON:   None. FINDINGS: Gallbladder: Multiple layering stones are seen dependently within the gallbladder, measuring up to 9 mm in size. No gallbladder wall thickening or pericholecystic fluid is seen. No ultrasonographic Murphy's sign is elicited. Common bile duct: Diameter: 0.4 cm, within normal limits in caliber. Liver: No focal lesion identified. Within normal limits in parenchymal echogenicity. IMPRESSION: Cholelithiasis. Gallbladder otherwise unremarkable. No evidence for cholecystitis or obstruction. Electronically Signed   By: Garald Balding M.D.   On: 06/01/2016 01:19    Time Spent in minutes  30   Joanathan Affeldt K M.D on 06/26/2016 at 9:26 AM  Between 7am to 7pm - Pager - [phone]  After 7pm go to [email or web address] - password [facility]  [facility] -  Office  [phone]

## 2016-06-26 NOTE — Progress Notes (Signed)
[facility]  Patient Status: Active (receiving services up to time of hospitalization)  [facility] is providing the following services: RN  If patient discharges after hours, please call [phone].   Edwinna Areola 06/26/2016, 2:51 PM

## 2016-06-27 ENCOUNTER — Encounter (HOSPITAL_COMMUNITY): Payer: Self-pay | Admitting: Anesthesiology

## 2016-06-27 ENCOUNTER — Inpatient Hospital Stay (HOSPITAL_COMMUNITY): Payer: BLUE CROSS/BLUE SHIELD | Admitting: Certified Registered"

## 2016-06-27 ENCOUNTER — Inpatient Hospital Stay: Payer: BLUE CROSS/BLUE SHIELD | Admitting: Internal Medicine

## 2016-06-27 ENCOUNTER — Encounter (HOSPITAL_COMMUNITY)
Admission: EM | Disposition: A | Discharge status: Home Health | Payer: Self-pay | Source: Home / Self Care | Attending: Internal Medicine

## 2016-06-27 DIAGNOSIS — R531 Weakness: Secondary | ICD-10-CM

## 2016-06-27 HISTORY — PX: TOTAL ANKLE ARTHROPLASTY: SHX811

## 2016-06-27 LAB — BASIC METABOLIC PANEL
ANION GAP: 8 (ref 5–15)
BUN: 5 mg/dL — ABNORMAL LOW (ref 6–20)
CALCIUM: 8.9 mg/dL (ref 8.9–10.3)
CO2: 24 mmol/L (ref 22–32)
Chloride: 101 mmol/L (ref 101–111)
Creatinine, Ser: 0.6 mg/dL (ref 0.44–1.00)
GFR calc Af Amer: >60 mL/min (ref >60)
GFR calc non Af Amer: >60 mL/min (ref >60)
Glucose, Bld: 94 mg/dL (ref 65–99)
POTASSIUM: 3.1 mmol/L — AB (ref 3.5–5.1)
Sodium: 133 mmol/L — ABNORMAL LOW (ref 135–145)

## 2016-06-27 LAB — TYPE AND SCREEN
ABO/RH(D): O POS
Antibody Screen: NEGATIVE
UNIT DIVISION: 0
UNIT DIVISION: 0
Unit division: 0
Unit division: 0

## 2016-06-27 LAB — CBC
HEMATOCRIT: 26.8 % — AB (ref 36.0–46.0)
Hemoglobin: 8.6 g/dL — ABNORMAL LOW (ref 12.0–15.0)
MCH: 25.4 pg — AB (ref 26.0–34.0)
MCHC: 32.1 g/dL (ref 30.0–36.0)
MCV: 79.1 fL (ref 78.0–100.0)
Platelets: 515 10*3/uL — ABNORMAL HIGH (ref 150–400)
RBC: 3.39 MIL/uL — ABNORMAL LOW (ref 3.87–5.11)
RDW: 22.9 % — AB (ref 11.5–15.5)
WBC: 12.6 10*3/uL — AB (ref 4.0–10.5)

## 2016-06-27 LAB — LEGIONELLA PNEUMOPHILA SEROGP 1 UR AG: L. PNEUMOPHILA SEROGP 1 UR AG: NEGATIVE

## 2016-06-27 LAB — SURGICAL PCR SCREEN
MRSA, PCR: NEGATIVE
STAPHYLOCOCCUS AUREUS: NEGATIVE

## 2016-06-27 LAB — VANCOMYCIN, TROUGH: Vancomycin Tr: 25 ug/mL (ref 15–20)

## 2016-06-27 SURGERY — ARTHROPLASTY, ANKLE, TOTAL
Anesthesia: General | Site: Knee | Laterality: Left

## 2016-06-27 MED ORDER — EPHEDRINE 5 MG/ML INJ
INTRAVENOUS | Status: AC
  Filled 2016-06-27: qty 10

## 2016-06-27 MED ORDER — MIDAZOLAM HCL 2 MG/2ML IJ SOLN
INTRAMUSCULAR | Status: DC | PRN
  Administered 2016-06-27 (×2): 1 mg via INTRAVENOUS

## 2016-06-27 MED ORDER — METOCLOPRAMIDE HCL 5 MG/ML IJ SOLN: 5.0000 mg | Freq: Three times a day (TID) | INTRAMUSCULAR | Status: DC | PRN

## 2016-06-27 MED ORDER — SODIUM CHLORIDE 0.9 % IV SOLN
INTRAVENOUS | Status: DC | PRN
  Administered 2016-06-27: 17:00:00 via INTRAVENOUS

## 2016-06-27 MED ORDER — ONDANSETRON HCL 4 MG/2ML IJ SOLN: 4.0000 mg | Freq: Four times a day (QID) | INTRAMUSCULAR | Status: DC | PRN

## 2016-06-27 MED ORDER — FENTANYL CITRATE (PF) 100 MCG/2ML IJ SOLN
INTRAMUSCULAR | Status: DC | PRN
  Administered 2016-06-27: 100 ug via INTRAVENOUS
  Administered 2016-06-27 (×6): 50 ug via INTRAVENOUS

## 2016-06-27 MED ORDER — ACETAMINOPHEN 650 MG RE SUPP: 650.0000 mg | Freq: Four times a day (QID) | RECTAL | Status: DC | PRN

## 2016-06-27 MED ORDER — LIDOCAINE 2% (20 MG/ML) 5 ML SYRINGE
INTRAMUSCULAR | Status: AC
  Filled 2016-06-27: qty 5

## 2016-06-27 MED ORDER — CHLORHEXIDINE GLUCONATE 4 % EX LIQD: 60.0000 mL | Freq: Once | CUTANEOUS | Status: DC

## 2016-06-27 MED ORDER — CEFAZOLIN SODIUM-DEXTROSE 2-4 GM/100ML-% IV SOLN
2.0000 g | INTRAVENOUS | Status: AC
  Administered 2016-06-27: 2 g via INTRAVENOUS
  Filled 2016-06-27 (×2): qty 100

## 2016-06-27 MED ORDER — PROPOFOL 10 MG/ML IV BOLUS
INTRAVENOUS | Status: DC | PRN
  Administered 2016-06-27: 20 mg via INTRAVENOUS
  Administered 2016-06-27: 150 mg via INTRAVENOUS
  Administered 2016-06-27 (×2): 20 mg via INTRAVENOUS
  Administered 2016-06-27 (×2): 10 mg via INTRAVENOUS
  Administered 2016-06-27: 20 mg via INTRAVENOUS
  Administered 2016-06-27: 30 mg via INTRAVENOUS

## 2016-06-27 MED ORDER — FENTANYL CITRATE (PF) 100 MCG/2ML IJ SOLN
INTRAMUSCULAR | Status: AC
  Filled 2016-06-27: qty 2

## 2016-06-27 MED ORDER — VANCOMYCIN HCL IN DEXTROSE 750-5 MG/150ML-% IV SOLN
750.0000 mg | Freq: Two times a day (BID) | INTRAVENOUS | Status: DC
  Administered 2016-06-27 – 2016-06-29 (×4): 750 mg via INTRAVENOUS
  Filled 2016-06-27 (×4): qty 150

## 2016-06-27 MED ORDER — LIDOCAINE HCL (CARDIAC) 20 MG/ML IV SOLN
INTRAVENOUS | Status: DC | PRN
  Administered 2016-06-27: 80 mg via INTRAVENOUS

## 2016-06-27 MED ORDER — SUCCINYLCHOLINE CHLORIDE 200 MG/10ML IV SOSY
PREFILLED_SYRINGE | INTRAVENOUS | Status: AC
  Filled 2016-06-27: qty 10

## 2016-06-27 MED ORDER — PHENYLEPHRINE 40 MCG/ML (10ML) SYRINGE FOR IV PUSH (FOR BLOOD PRESSURE SUPPORT)
PREFILLED_SYRINGE | INTRAVENOUS | Status: AC
  Filled 2016-06-27: qty 10

## 2016-06-27 MED ORDER — ONDANSETRON HCL 4 MG/2ML IJ SOLN
INTRAMUSCULAR | Status: DC | PRN
  Administered 2016-06-27: 4 mg via INTRAVENOUS

## 2016-06-27 MED ORDER — POTASSIUM CHLORIDE 20 MEQ/15ML (10%) PO SOLN
40.0000 meq | Freq: Two times a day (BID) | ORAL | Status: AC
  Administered 2016-06-27 (×2): 40 meq via ORAL
  Filled 2016-06-27 (×2): qty 30

## 2016-06-27 MED ORDER — ESMOLOL HCL 100 MG/10ML IV SOLN
INTRAVENOUS | Status: AC
  Filled 2016-06-27: qty 10

## 2016-06-27 MED ORDER — ONDANSETRON HCL 4 MG/2ML IJ SOLN
INTRAMUSCULAR | Status: AC
  Filled 2016-06-27: qty 2

## 2016-06-27 MED ORDER — PROPOFOL 10 MG/ML IV BOLUS
INTRAVENOUS | Status: AC
  Filled 2016-06-27: qty 40

## 2016-06-27 MED ORDER — ONDANSETRON HCL 4 MG PO TABS: 4.0000 mg | ORAL_TABLET | Freq: Four times a day (QID) | ORAL | Status: DC | PRN

## 2016-06-27 MED ORDER — ARTIFICIAL TEARS OP OINT
TOPICAL_OINTMENT | OPHTHALMIC | Status: AC
  Filled 2016-06-27: qty 7

## 2016-06-27 MED ORDER — FENTANYL CITRATE (PF) 100 MCG/2ML IJ SOLN
INTRAMUSCULAR | Status: AC
  Filled 2016-06-27: qty 4

## 2016-06-27 MED ORDER — METOCLOPRAMIDE HCL 5 MG PO TABS: 5.0000 mg | ORAL_TABLET | Freq: Three times a day (TID) | ORAL | Status: DC | PRN

## 2016-06-27 MED ORDER — ESMOLOL HCL 100 MG/10ML IV SOLN
INTRAVENOUS | Status: DC | PRN
  Administered 2016-06-27: 20 mg via INTRAVENOUS
  Administered 2016-06-27 (×2): 10 mg via INTRAVENOUS

## 2016-06-27 MED ORDER — METOCLOPRAMIDE HCL 5 MG/ML IJ SOLN: 10.0000 mg | Freq: Once | INTRAMUSCULAR | Status: DC | PRN

## 2016-06-27 MED ORDER — HYDROMORPHONE HCL 1 MG/ML IJ SOLN: 0.2500 mg | INTRAMUSCULAR | Status: DC | PRN

## 2016-06-27 MED ORDER — MIDAZOLAM HCL 2 MG/2ML IJ SOLN
INTRAMUSCULAR | Status: AC
  Filled 2016-06-27: qty 2

## 2016-06-27 MED ORDER — BUPIVACAINE-EPINEPHRINE (PF) 0.5% -1:200000 IJ SOLN
INTRAMUSCULAR | Status: AC
  Filled 2016-06-27: qty 30

## 2016-06-27 MED ORDER — ACETAMINOPHEN 325 MG PO TABS: 650.0000 mg | ORAL_TABLET | Freq: Four times a day (QID) | ORAL | Status: DC | PRN

## 2016-06-27 MED ORDER — BUPIVACAINE-EPINEPHRINE 0.5% -1:200000 IJ SOLN
INTRAMUSCULAR | Status: DC | PRN
  Administered 2016-06-27: 20 mL

## 2016-06-27 MED ORDER — POVIDONE-IODINE 10 % EX SWAB: 2.0000 "application " | Freq: Once | CUTANEOUS | Status: DC

## 2016-06-27 MED ORDER — MEPERIDINE HCL 25 MG/ML IJ SOLN: 6.2500 mg | INTRAMUSCULAR | Status: DC | PRN

## 2016-06-27 MED ORDER — SODIUM CHLORIDE 0.9 % IR SOLN
Status: DC | PRN
  Administered 2016-06-27: 6000 mL

## 2016-06-27 SURGICAL SUPPLY — 42 items
BANDAGE ACE 6X5 VEL STRL LF (GAUZE/BANDAGES/DRESSINGS) ×3 IMPLANT
BANDAGE ESMARK 6X9 LF (GAUZE/BANDAGES/DRESSINGS) ×1 IMPLANT
BLADE CUDA 5.5 (BLADE) IMPLANT
BLADE GREAT WHITE 4.2 (BLADE) ×2 IMPLANT
BLADE GREAT WHITE 4.2MM (BLADE) ×1
BLADE SURG ROTATE 9660 (MISCELLANEOUS) IMPLANT
BNDG CMPR 9X6 STRL LF SNTH (GAUZE/BANDAGES/DRESSINGS) ×1
BNDG ESMARK 6X9 LF (GAUZE/BANDAGES/DRESSINGS) ×3
BNDG GAUZE ELAST 4 BULKY (GAUZE/BANDAGES/DRESSINGS) ×3 IMPLANT
BUR OVAL 6.0 (BURR) IMPLANT
CUFF TOURNIQUET SINGLE 34IN LL (TOURNIQUET CUFF) IMPLANT
CUFF TOURNIQUET SINGLE 44IN (TOURNIQUET CUFF) IMPLANT
DRAPE ARTHROSCOPY W/POUCH 114 (DRAPES) ×3 IMPLANT
DRAPE U-SHAPE 47X51 STRL (DRAPES) ×3 IMPLANT
DURAPREP 26ML APPLICATOR (WOUND CARE) ×3 IMPLANT
FILTER STRAW FLUID ASPIR (MISCELLANEOUS) ×3 IMPLANT
GAUZE SPONGE 4X4 12PLY STRL (GAUZE/BANDAGES/DRESSINGS) ×3 IMPLANT
GAUZE XEROFORM 1X8 LF (GAUZE/BANDAGES/DRESSINGS) ×3 IMPLANT
GLOVE BIO SURGEON STRL SZ 6.5 (GLOVE) ×2 IMPLANT
GLOVE BIO SURGEONS STRL SZ 6.5 (GLOVE) ×1
GLOVE SURG SS PI 8.0 STRL IVOR (GLOVE) ×6 IMPLANT
GOWN EXTRA PROTECTION XL (GOWNS) ×3 IMPLANT
GOWN STRL REUS W/ TWL LRG LVL3 (GOWN DISPOSABLE) ×4 IMPLANT
GOWN STRL REUS W/TWL LRG LVL3 (GOWN DISPOSABLE) ×12
KIT BASIN OR (CUSTOM PROCEDURE TRAY) ×3 IMPLANT
KIT ROOM TURNOVER OR (KITS) ×3 IMPLANT
MANIFOLD NEPTUNE II (INSTRUMENTS) ×3 IMPLANT
NEEDLE 18GX1X1/2 (RX/OR ONLY) (NEEDLE) ×3 IMPLANT
PACK ARTHROSCOPY DSU (CUSTOM PROCEDURE TRAY) ×3 IMPLANT
PAD ARMBOARD 7.5X6 YLW CONV (MISCELLANEOUS) ×6 IMPLANT
SET ARTHROSCOPY TUBING (MISCELLANEOUS) ×2
SET ARTHROSCOPY TUBING LN (MISCELLANEOUS) ×1 IMPLANT
SPONGE GAUZE 4X4 12PLY STER LF (GAUZE/BANDAGES/DRESSINGS) ×3 IMPLANT
SPONGE LAP 4X18 X RAY DECT (DISPOSABLE) ×3 IMPLANT
SUT ETHILON 4 0 PS 2 18 (SUTURE) ×3 IMPLANT
SYR 30ML LL (SYRINGE) ×3 IMPLANT
SYR 3ML LL SCALE MARK (SYRINGE) ×3 IMPLANT
TOWEL OR 17X24 6PK STRL BLUE (TOWEL DISPOSABLE) ×3 IMPLANT
TUBE CONNECTING 12'X1/4 (SUCTIONS) ×1
TUBE CONNECTING 12X1/4 (SUCTIONS) ×2 IMPLANT
WAND HAND CNTRL MULTIVAC 90 (MISCELLANEOUS) IMPLANT
WATER STERILE IRR 1000ML POUR (IV SOLUTION) ×3 IMPLANT

## 2016-06-27 NOTE — Op Note (Addendum)
Surgery Date: 06/23/2016 - 06/27/2016  Surgeon(s): Nicholes Stairs, MD  ANESTHESIA:  general  FLUIDS: Per anesthesia record.   ESTIMATED BLOOD LOSS: minimal  PREOPERATIVE DIAGNOSES:  1. Left knee medial and lateral meniscus tears 2.  Left knee extensive synovitis, without gross purulence 3. Left Tricompartmental osteoarthritis  POSTOPERATIVE DIAGNOSES:  same  PROCEDURES PERFORMED:  1. left knee arthroscopy for irrigation and debridement of septic arthritis 2. knee arthroscopy with extensive synovectomy   DESCRIPTION OF PROCEDURE: Angie Wright is a 61 y.o.-year-old female with left knee septic arthritis. She was initially treated 2 weeks ago with arthroscopic irrigation and debridement and lavage. This has been clinically or she'll be treated. She has developed a recurrent knee effusion some increasing pain although no frank pain with passive motion or erythema and warmth but a concomitant large leukocytosis as well. An MRI was confirmatory for large knee effusion and reactive synovium concerning for need for second surgery.  Plans are to proceed with arthroscopic irrigation debridement with extensive synovectomy were discussed with the patient.  Full discussion held regarding risks benefits alternatives and complications related surgical intervention. Conservative care options reviewed. All questions answered.  The patient was identified in the preoperative holding area and the operative extremity was marked. The patient was brought to the operating room and transferred to operating table in a supine position. Satisfactory general anesthesia was induced by anesthesiology.    Standard anterolateral, anteromedial arthroscopy portals were obtained. The anteromedial portal was obtained with a spinal needle for localization under direct visualization with subsequent diagnostic findings. Upon initial entry into the knee small rush of sanguinous to serous fluid returned through our trocar. This  fluid was sent for culture.  Anteromedial and anterolateral chambers: severe synovitis. The synovitis was debrided with a 4.5 mm full radius shaver through both the anteromedial and lateral portals.  Once adequate visualization was obtained following debridement of the anterior synovium we were able to visualize the remainder of the knee. We worked along the medial lateral gutters to debride synovium that was noted to be hyperemic and extensive. We used the full-radius shaver to debride that synovium. Within moved to the patellofemoral compartment where the synovium was also very proliferative and hyperemic. This was also debrided extensively with the full-radius shaver all the way to the suprapatellar pouch and on both medial and lateral gutters. Of note there was no gross purulence noted.  Suprapatellar pouch and gutters: severe synovitis or debris. Patella chondral surface: Grade 4 Trochlear chondral surface: Grade 4 Patellofemoral tracking: well Medial meniscus: degenerative tearing.  Medial femoral condyle flexion bearing surface: Grade 3 Medial femoral condyle extension bearing surface: Grade 2 Medial tibial plateau: Grade 3 Anterior cruciate ligament:stable Posterior cruciate ligament:stable Lateral meniscus: degenerative tearing.   Lateral femoral condyle flexion bearing surface: Grade 2 Lateral femoral condyle extension bearing surface: Grade 4 Lateral tibial plateau: Grade 3  After completion of synovectomy, 6 L of arthroscopic fluid was ran through the knee. The portals were approximated with buried monocryl. All excess fluid was expressed from the joint.  Xeroform sterile gauze dressings were applied followed by Ace bandage and ice pack.   DISPOSITION: Angie Wright was awakened from general anesthetic, extubated, taken to the recovery room in medically stable condition, no apparent complications. The patient may be weightbearing as tolerated to the operative lower extremity.  Range  of motion of right knee as tolerated.  She'll return to the primary medical service for management of 4 leukocytosis as well as antibiotics. I will  plan on seeing her back in my clinic in 2 weeks for wound check. Also recommend once daily 81 mg aspirin for DVT prophylaxis 4 weeks.  Nicholes Stairs

## 2016-06-27 NOTE — Anesthesia Postprocedure Evaluation (Addendum)
Anesthesia Post Note  Patient: Angie Wright  Procedure(s) Performed: Procedure(s) (LRB): ARTHROSCOPY KNEE/IRRIGATION AND DEBRIDEMENT (Right)  Patient location during evaluation: PACU Anesthesia Type: General Level of consciousness: sedated Pain management: pain level controlled Vital Signs Assessment: post-procedure vital signs reviewed and stable Respiratory status: spontaneous breathing and respiratory function stable Cardiovascular status: stable Anesthetic complications: no       Last Vitals:  Vitals:   06/27/16 1845 06/27/16 1850  BP:  (!) 140/56  Pulse: 93 91  Resp: (!) 22 (!) 28  Temp:  36.7 C    Last Pain:  Vitals:   06/27/16 1850  TempSrc:   PainSc: 0-No pain                 Ohanna Gassert DANIEL

## 2016-06-27 NOTE — Progress Notes (Signed)
Pharmacy Antibiotic Note  Angie Wright is a 61 y.o. female admitted on 06/23/2016 with Cellulitis/Possbile septic arthritis.  Pharmacy has been consulted for Vancomycin dosing.  1/23: Vancomycin trough is elevated at 25, renal function stable  Plan: -Decrease vancomycin to 750 mg IV q12h starting later today -Re-check trough at steady state  Weight: 171 lb 15.3 oz (78 kg)  Temp (24hrs), Avg:98.5 F (36.9 C), Min:98.1 F (36.7 C), Max:98.9 F (37.2 C)   Recent Labs Lab 06/23/16 1828 06/23/16 1849 06/23/16 2130 06/24/16 0647 06/25/16 0410 06/26/16 0700 06/27/16 0442  WBC 23.4*  --  21.6* 19.8* 16.7* 12.3* 12.6*  CREATININE 0.68  --   --  0.59 0.66 0.58 0.60  LATICACIDVEN  --  0.95  --   --   --   --   --   VANCOTROUGH  --   --   --   --   --   --  25*    Estimated Creatinine Clearance: 77.9 mL/min (by C-G formula based on SCr of 0.6 mg/dL).    No Known Allergies   Angie Wright 06/27/2016 6:05 AM

## 2016-06-27 NOTE — H&P (Signed)
H&P update  The surgical history has been reviewed and remains accurate without interval change.  The patient was re-examined and patient's physiologic condition has not changed significantly in the last 30 days. The condition still exists that makes this procedure necessary. The treatment plan remains the same, without new options for care.  No new pharmacological allergies or types of therapy has been initiated that would change the plan or the appropriateness of the plan.  The patient and/or family understand the potential benefits and risks.  Jason P. Stann Mainland, MD 06/27/2016 4:53 PM

## 2016-06-27 NOTE — Progress Notes (Signed)
PROGRESS NOTE                                                                                                                                                                                                             Patient Demographics:    Angie Wright, is a 61 y.o. female, DOB - 1955/11/18, GS:[id number]  Admit date - 06/23/2016   Admitting Physician Edwin Dada, MD  Outpatient Primary MD for the patient is No PCP Per Patient  LOS - 3  Chief Complaint  Patient presents with  . Abnormal Lab       Brief Narrative  Angie Wright is a 61 y.o. female with a past medical history significant for iron deficiency anemia and recent S pneumo septic arthritis of hte LEFT knee . The patient was admitted last Dec with LEFT knee swelling, found to have septic arthritis, debrided by Dr. Rolena Infante Dec 28th, it grew S pneumo.  Discharged on ceftriaxone 2g daily. She was readmitted feeling poorly, worsening swelling of the left knee .   Subjective:    Angie Wright today has, No headache, No chest pain, No abdominal pain - No Nausea, No new weakness tingling or numbness, No Cough - SOB. Moderate L.Knee pain   Assessment  & Plan :     1.Left knee septic arthritis. Status post debridement on 06/01/2016 by Dr. Rolena Infante, she has been on IV Rocephin via PICC line at home since then. Now swelling getting worse, does have leukocytosis, exam consistent with partially treated septic arthritis. Orthopedics consulted MRI of the left knee on 06/26/2016 shows large joint effusion, most likely to OR for a washout on 06/27/2016. ID following as well.  2. Possible pneumonia. Clinically ruled out.  3. History of iron deficiency anemia. Worse likely due to perioperative blood loss, transfused 2 units on 06/25/2016, stable H&H, monitor. We will place her on iron prior to discharge. She received IV iron recently.  4. Hypokalemia. Replaced.   Family  Communication  :  family  Code Status : Full  Diet : Diet NPO time specified Except for: Sips with Meds    Disposition Plan  :  TBD  Consults  :  ID, Ortho  Procedures  :     DVT Prophylaxis  :   Heparin    Lab Results  Component Value Date  PLT 515 (H) 06/27/2016    Inpatient Medications  Scheduled Meds: . heparin subcutaneous  5,000 Units Subcutaneous Q8H  . potassium chloride  40 mEq Oral BID  . vancomycin  750 mg Intravenous Q12H   Continuous Infusions:  PRN Meds:.acetaminophen **OR** acetaminophen, chlorpheniramine-HYDROcodone, HYDROcodone-acetaminophen, ondansetron **OR** ondansetron (ZOFRAN) IV, polyethylene glycol, sodium chloride flush  Antibiotics  :    Anti-infectives    Start     Dose/Rate Route Frequency Ordered Stop   06/27/16 2200  vancomycin (VANCOCIN) IVPB 750 mg/150 ml premix     750 mg 150 mL/hr over 60 Minutes Intravenous Every 12 hours 06/27/16 0607     06/24/16 0300  ceFEPIme (MAXIPIME) 1 g in dextrose 5 % 50 mL IVPB  Status:  Discontinued     1 g 100 mL/hr over 30 Minutes Intravenous Every 8 hours 06/24/16 0143 06/25/16 1508   06/23/16 1845  ceFEPIme (MAXIPIME) 1 g in dextrose 5 % 50 mL IVPB     1 g 100 mL/hr over 30 Minutes Intravenous  Once 06/23/16 1818 06/23/16 2015   06/23/16 1830  vancomycin (VANCOCIN) IVPB 1000 mg/200 mL premix  Status:  Discontinued     1,000 mg 200 mL/hr over 60 Minutes Intravenous Every 12 hours 06/23/16 1825 06/27/16 0604         Objective:   Vitals:   06/26/16 1200 06/26/16 1237 06/26/16 2121 06/27/16 0630  BP:  (!) 121/49 (!) 118/48 (!) 124/53  Pulse:  85 83 73  Resp:  17 16 16   Temp:  98.9 F (37.2 C) 98.1 F (36.7 C) 98 F (36.7 C)  TempSrc:  Oral Oral   SpO2:  99% 98% 98%  Weight: 78 kg (171 lb 15.3 oz)       Wt Readings from Last 3 Encounters:  06/26/16 78 kg (171 lb 15.3 oz)  06/01/16 78 kg (172 lb)     Intake/Output Summary (Last 24 hours) at 06/27/16 1010 Last data filed at 06/27/16  0548  Gross per 24 hour  Intake           814.17 ml  Output                0 ml  Net           814.17 ml     Physical Exam  Awake Alert, Oriented X 3, No new F.N deficits, Normal affect [place].AT,PERRAL Supple Neck,No JVD, No cervical lymphadenopathy appriciated.  Symmetrical Chest wall movement, Good air movement bilaterally, CTAB RRR,No Gallops,Rubs or new Murmurs, No Parasternal Heave +ve B.Sounds, Abd Soft, No tenderness, No organomegaly appriciated, No rebound - guarding or rigidity. No Cyanosis, Clubbing or edema, No new Rash or bruise ,  L knee has ++ effusion mild warmth, mildly painful passive ROM    Data Review:    CBC  Recent Labs Lab 06/23/16 1828 06/23/16 2130 06/24/16 0647 06/25/16 0410 06/26/16 0700 06/27/16 0442  WBC 23.4* 21.6* 19.8* 16.7* 12.3* 12.6*  HGB 7.0* 6.2* 7.0* 6.6* 9.0* 8.6*  HCT 21.7* 19.3* 22.1* 20.8* 27.5* 26.8*  PLT 526* 478* 499* 498* 447* 515*  MCV 75.3* 74.5* 76.7* 77.6* 78.8 79.1  MCH 24.3* 23.9* 24.3* 24.6* 25.8* 25.4*  MCHC 32.3 32.1 31.7 31.7 32.7 32.1  RDW 26.2* 25.9* 25.3* 25.7* 22.9* 22.9*  LYMPHSABS 1.4 2.2  --  1.7 0.7  --   MONOABS 0.7 0.2  --  0.7 0.5  --   EOSABS 0.5 0.6  --  0.8* 0.5  --  BASOSABS 0.0 0.0  --  0.2* 0.1  --     Chemistries   Recent Labs Lab 06/23/16 1828 06/24/16 0647 06/25/16 0410 06/26/16 0700 06/27/16 0442  NA 132* 133* 135 135 133*  K 4.0 4.0 4.0 3.7 3.1*  CL 99* 102 108 106 101  CO2 23 22 19* 23 24  GLUCOSE 116* 100* 89 122* 94  BUN 11 9 7  5* 5*  CREATININE 0.68 0.59 0.66 0.58 0.60  CALCIUM 9.5 9.0 8.6* 8.7* 8.9  AST 57* 38  --   --   --   ALT 66* 50  --   --   --   ALKPHOS 486* 360*  --   --   --   BILITOT 0.9 0.8  --   --   --    ------------------------------------------------------------------------------------------------------------------ No results for input(s): CHOL, HDL, LDLCALC, TRIG, CHOLHDL, LDLDIRECT in the last 72 hours.  No results found for:  HGBA1C ------------------------------------------------------------------------------------------------------------------ No results for input(s): TSH, T4TOTAL, T3FREE, THYROIDAB in the last 72 hours.  Invalid input(s): FREET3 ------------------------------------------------------------------------------------------------------------------  Recent Labs  06/26/16 0754  VITAMINB12 1,615*  FOLATE 14.5  FERRITIN 976*  TIBC 132*  IRON 19*  RETICCTPCT 4.7*    Coagulation profile No results for input(s): INR, PROTIME in the last 168 hours.  No results for input(s): DDIMER in the last 72 hours.  Cardiac Enzymes No results for input(s): CKMB, TROPONINI, MYOGLOBIN in the last 168 hours.  Invalid input(s): CK ------------------------------------------------------------------------------------------------------------------ No results found for: BNP  Micro Results Recent Results (from the past 240 hour(s))  Blood Culture (routine x 2)     Status: None (Preliminary result)   Collection Time: 06/23/16  6:15 PM  Result Value Ref Range Status   Specimen Description BLOOD LEFT WRIST  Final   Special Requests IN PEDIATRIC BOTTLE 3CC  Final   Culture NO GROWTH 3 DAYS  Final   Report Status PENDING  Incomplete  Blood Culture (routine x 2)     Status: None (Preliminary result)   Collection Time: 06/23/16  6:28 PM  Result Value Ref Range Status   Specimen Description BLOOD LEFT ARM  Final   Special Requests BOTTLES DRAWN AEROBIC AND ANAEROBIC 5CC  Final   Culture NO GROWTH 3 DAYS  Final   Report Status PENDING  Incomplete  Urine culture     Status: Abnormal   Collection Time: 06/24/16  1:10 AM  Result Value Ref Range Status   Specimen Description URINE, RANDOM  Final   Special Requests NONE  Final   Culture <10,000 COLONIES/mL INSIGNIFICANT GROWTH (A)  Final   Report Status 06/25/2016 FINAL  Final  Respiratory Panel by PCR     Status: None   Collection Time: 06/24/16  3:41 PM  Result  Value Ref Range Status   Adenovirus NOT DETECTED NOT DETECTED Final   Coronavirus 229E NOT DETECTED NOT DETECTED Final   Coronavirus HKU1 NOT DETECTED NOT DETECTED Final   Coronavirus NL63 NOT DETECTED NOT DETECTED Final   Coronavirus OC43 NOT DETECTED NOT DETECTED Final   Metapneumovirus NOT DETECTED NOT DETECTED Final   Rhinovirus / Enterovirus NOT DETECTED NOT DETECTED Final   Influenza A NOT DETECTED NOT DETECTED Final   Influenza B NOT DETECTED NOT DETECTED Final   Parainfluenza Virus 1 NOT DETECTED NOT DETECTED Final   Parainfluenza Virus 2 NOT DETECTED NOT DETECTED Final   Parainfluenza Virus 3 NOT DETECTED NOT DETECTED Final   Parainfluenza Virus 4 NOT DETECTED NOT DETECTED Final  Respiratory Syncytial Virus NOT DETECTED NOT DETECTED Final   Bordetella pertussis NOT DETECTED NOT DETECTED Final   Chlamydophila pneumoniae NOT DETECTED NOT DETECTED Final   Mycoplasma pneumoniae NOT DETECTED NOT DETECTED Final    Radiology Reports Dg Chest 2 View  Result Date: 06/26/2016 CLINICAL DATA:  Cough and congestion. EXAM: CHEST  2 VIEW COMPARISON:  06/23/2016. FINDINGS: Right PICC line noted with tip at cavoatrial junction. Heart size normal. Diffuse bilateral interstitial prominence again noted. Findings suggest interstitial edema and/or interstitial pneumonitis. Bibasilar subsegmental atelectasis. Tiny bilateral pleural effusions. No pneumothorax . IMPRESSION: 1. Right PICC line stable position. 2. Diffuse bilateral from interstitial prominence again noted. Bibasilar subsegmental atelectasis. Findings suggest interstitial edema and/or pneumonitis. Tiny bilateral pleural effusions again noted . Electronically Signed   By: Marcello Moores  Register   On: 06/26/2016 10:33   Dg Chest 2 View  Result Date: 06/23/2016 CLINICAL DATA:  Elevated WBC, cough EXAM: CHEST  2 VIEW COMPARISON:  03/31/2016 FINDINGS: Right-sided PICC line with the tip projecting over the lower SVC. Bilateral trace pleural effusions.  Left lower lobe airspace disease. Minimal right lower lobe airspace disease. Bilateral interstitial thickening. No pneumothorax. Stable cardiomegaly. Moderate osteoarthritis of bilateral glenohumeral joints. IMPRESSION: 1. Right-sided PICC line with the tip projecting over the lower SVC. 2. Bilateral interstitial thickening and bilateral lower lobe airspace disease which may reflect interstitial edema and atelectasis versus multilobar pneumonia. Electronically Signed   By: Kathreen Devoid   On: 06/23/2016 17:50   Dg Chest 2 View  Result Date: 06/01/2016 CLINICAL DATA:  Acute onset of fever and disorientation. Initial encounter. EXAM: CHEST  2 VIEW COMPARISON:  None. FINDINGS: The lungs are well-aerated. Vascular congestion is noted. Minimal bilateral opacities may reflect atelectasis or possibly mild infection. There is no evidence of pleural effusion or pneumothorax. The heart is enlarged. No acute osseous abnormalities are seen. Chronic sclerosis is seen at the humeral heads bilaterally. IMPRESSION: Vascular congestion and cardiomegaly noted. Minimal bilateral opacities may reflect atelectasis or possibly mild infection. Electronically Signed   By: Garald Balding M.D.   On: 06/01/2016 00:03   Dg Knee 1-2 Views Left  Result Date: 06/25/2016 CLINICAL DATA:  Pain and swelling of the left knee. Knee surgery few weeks ago. EXAM: LEFT KNEE - 1-2 VIEW COMPARISON:  06/01/2016 FINDINGS: Tricompartmental osteoarthritis with joint space narrowing and spurring as before without acute fracture nor bone destruction. No intra-articular loose bodies. Suprapatellar surgical drain has been removed since prior comparison. Suprapatellar moderate to large joint effusion. 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 No acute osseous abnormality or bone destruction. Electronically Signed   By: Ashley Royalty M.D.   On: 06/25/2016 23:14   Mr Foot Right W Wo Contrast  Result Date:  06/02/2016 CLINICAL DATA:  Right dorsal foot pain and swelling since Tuesday. No known injury. EXAM: MRI OF THE RIGHT FOREFOOT WITHOUT AND WITH CONTRAST TECHNIQUE: Multiplanar, multisequence MR imaging of the right foot was performed before and after the administration of intravenous contrast. CONTRAST:  52mL MULTIHANCE GADOBENATE DIMEGLUMINE 529 MG/ML IV SOLN COMPARISON:  05/31/2016 radiographs of the right foot FINDINGS: Bones/Joint/Cartilage Mild reactive marrow edema about the second TMT joint with joint space narrowing and subchondral cyst at the base of the second metatarsal are consistent with osteoarthritis. There is marrow edema involving the proximal second metatarsal shaft with faint hypointense linear foci potentially representing a the subtle stress fracture. No findings of acute osteomyelitis. Sub chondral cystic change of the medial and mid cuneiform  are noted. Mild hallux valgus. Calcaneal enthesophyte along the plantar aspect. No acute abnormality of the plantar fascia nor Achilles. Ligaments Nonacute Muscles and Tendons The tendons crossing the ankle joint appear intact. Soft tissues Mild diffuse soft tissue swelling along the plantar dorsal aspect of the foot. 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 Reactive marrow edema about the 2nd TMT joint involving the second metatarsal and middle cuneiform. Further distally within the proximal second metatarsal shaft is marrow edema with minimal faint linear hypointensity seen on some of the images suggesting a possible stress fracture. No findings of acute osteomyelitis. Diffuse mild nonspecific soft tissue edema of the foot, query cellulitis. Electronically Signed   By: Ashley Royalty M.D.   On: 06/02/2016 20:27   Mr Knee Left Wo Contrast  Result Date: 06/26/2016 CLINICAL DATA:  Chronic left knee pain and swelling. EXAM: MRI OF  THE LEFT KNEE WITHOUT CONTRAST TECHNIQUE: Multiplanar, multisequence MR imaging of the knee was performed. No intravenous contrast was administered. COMPARISON:  Radiographs 06/25/2016 FINDINGS: Exam is significantly limited by patient motion. MENISCI Medial meniscus:  Severely degenerated and torn. Lateral meniscus:  Severely degenerated and torn. LIGAMENTS Cruciates: Severe mucoid degeneration of the ACL. The PCL is intact. Collaterals:  Grossly intact.  Mcl and pes anserine bursitis noted. CARTILAGE Patellofemoral:  Moderate degenerative chondrosis/ chondromalacia. Medial: Advanced degenerative chondrosis with full or near full-thickness cartilage loss, joint space narrowing, osteophytic spurring and subchondral edema. Lateral: Advanced degenerative chondrosis with areas of full thickness cartilage loss, joint space narrowing and spurring. Joint:  Large joint effusion and moderate synovitis. Popliteal Fossa:  Small Baker's cyst. Extensor Mechanism: The patella retinacular structures are intact and the quadriceps and patellar tendons are intact. Bones: No acute bony findings. 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 Other: The knee musculature is grossly normal. IMPRESSION: 1. Very limited examination due to patient motion. 2. Significant tricompartmental degenerative changes. 3. Degenerated and torn menisci. 4. Grossly intact ligamentous structures. Mucoid degeneration of the ACL is noted. 5. Large joint effusion and moderate synovitis. Electronically Signed   By: Marijo Sanes M.D.   On: 06/26/2016 17:11   Dg Knee Left Port  Result Date: 06/01/2016 CLINICAL DATA:  Patient status post debridement of the left knee with drain placement. EXAM: PORTABLE LEFT KNEE - 1-2 VIEW COMPARISON:  None. FINDINGS: Drainage catheter projects over the anterior knee. Tricompartmental osteoarthritis. Normal anatomic alignment. No evidence for acute fracture. IMPRESSION: Drain projects over the anterior knee soft tissues.  Tricompartmental osteoarthritis. Electronically Signed   By: Lovey Newcomer M.D.   On: 06/01/2016 10:05   Dg Foot Complete Right  Result Date: 06/01/2016 CLINICAL DATA:  Acute onset of right dorsal foot pain and swelling. Initial encounter. EXAM: RIGHT FOOT COMPLETE - 3+ VIEW COMPARISON:  None. FINDINGS: There is no evidence of fracture or dislocation. Degenerative change is noted at the medial aspect of the midfoot. There is no evidence of talar subluxation; the subtalar joint is unremarkable in appearance. A plantar calcaneal spur is seen. No significant soft tissue abnormalities are seen. IMPRESSION: 1. No evidence of fracture or dislocation. 2. Degenerative change at the medial aspect of the midfoot. Electronically Signed   By: Garald Balding M.D.   On: 06/01/2016 00:04   US Abdomen Limited Ruq  Result Date: 06/01/2016 CLINICAL DATA:  Acute onset of fever.  Initial encounter. EXAM: US ABDOMEN LIMITED - RIGHT UPPER QUADRANT COMPARISON:  None. FINDINGS: Gallbladder: Multiple layering stones are seen dependently within the gallbladder, measuring up to 9 mm in size. No gallbladder wall thickening or pericholecystic fluid is seen. No ultrasonographic Murphy's sign is elicited. Common bile duct: Diameter: 0.4 cm, within normal limits in caliber. Liver: No focal lesion identified. Within normal limits in parenchymal echogenicity. IMPRESSION: Cholelithiasis. Gallbladder otherwise unremarkable. No evidence for cholecystitis or obstruction. Electronically Signed   By: Garald Balding M.D.   On: 06/01/2016 01:19    Time Spent in minutes  30   SINGH,PRASHANT K M.D on 06/27/2016 at 10:10 AM  Between 7am to 7pm - Pager - [phone]  After 7pm go to [email or web address] - password [facility]  [facility] -  Office  [phone]

## 2016-06-27 NOTE — Transfer of Care (Signed)
Immediate Anesthesia Transfer of Care Note  Patient: Angie Wright  Procedure(s) Performed: Procedure(s): ARTHROSCOPY KNEE/IRRIGATION AND DEBRIDEMENT (Right)  Patient Location: PACU  Anesthesia Type:General  Level of Consciousness: patient cooperative, drowsy  Airway & Oxygen Therapy: Patient Spontanous Breathing and Patient connected to face mask oxygen  Post-op Assessment: Report given to RN and Post -op Vital signs reviewed and stable  Post vital signs: Reviewed and stable  Last Vitals:  Vitals:   06/27/16 1436 06/27/16 1820  BP: 137/80   Pulse: 85   Resp: 17   Temp: 36.4 C 36.7 C    Last Pain:  Vitals:   06/27/16 1436  TempSrc: Oral  PainSc:          Complications: No apparent anesthesia complications

## 2016-06-27 NOTE — Progress Notes (Signed)
Plan on surgical left knee I and D given clinical picture and large knee effusion.  Pt is NPO and posted for today after my clinic after 5 pm.  Nicholes Stairs

## 2016-06-27 NOTE — Progress Notes (Signed)
Angie Wright for Infectious Disease   Reason for visit: Follow up on septic arthritis  Interval History: MRI with large effusion; Dr. Stann Mainland plans to I and D today; WBC stable at 12, afebrile; NPO now; no complaints.   Physical Exam: Constitutional:  Vitals:   06/26/16 2121 06/27/16 0630  BP: (!) 118/48 (!) 124/53  Pulse: 83 73  Resp: 16 16  Temp: 98.1 F (36.7 C) 98 F (36.7 C)   patient appears in NAD Respiratory: Normal respiratory effort; CTA B Cardiovascular: RRR MS: left knee effusion, no warmth  Review of Systems: Constitutional: negative for fevers and chills Respiratory: negative for sputum Gastrointestinal: negative for diarrhea  Lab Results  Component Value Date   WBC 12.6 (H) 06/27/2016   HGB 8.6 (L) 06/27/2016   HCT 26.8 (L) 06/27/2016   MCV 79.1 06/27/2016   PLT 515 (H) 06/27/2016    Lab Results  Component Value Date   CREATININE 0.60 06/27/2016   BUN 5 (L) 06/27/2016   NA 133 (L) 06/27/2016   K 3.1 (L) 06/27/2016   CL 101 06/27/2016   CO2 24 06/27/2016    Lab Results  Component Value Date   ALT 50 06/24/2016   AST 38 06/24/2016   ALKPHOS 360 (H) 06/24/2016     Microbiology: Recent Results (from the past 240 hour(s))  Blood Culture (routine x 2)     Status: None (Preliminary result)   Collection Time: 06/23/16  6:15 PM  Result Value Ref Range Status   Specimen Description BLOOD LEFT WRIST  Final   Special Requests IN PEDIATRIC BOTTLE 3CC  Final   Culture NO GROWTH 3 DAYS  Final   Report Status PENDING  Incomplete  Blood Culture (routine x 2)     Status: None (Preliminary result)   Collection Time: 06/23/16  6:28 PM  Result Value Ref Range Status   Specimen Description BLOOD LEFT ARM  Final   Special Requests BOTTLES DRAWN AEROBIC AND ANAEROBIC 5CC  Final   Culture NO GROWTH 3 DAYS  Final   Report Status PENDING  Incomplete  Urine culture     Status: Abnormal   Collection Time: 06/24/16  1:10 AM  Result Value Ref Range Status   Specimen Description URINE, RANDOM  Final   Special Requests NONE  Final   Culture <10,000 COLONIES/mL INSIGNIFICANT GROWTH (A)  Final   Report Status 06/25/2016 FINAL  Final  Respiratory Panel by PCR     Status: None   Collection Time: 06/24/16  3:41 PM  Result Value Ref Range Status   Adenovirus NOT DETECTED NOT DETECTED Final   Coronavirus 229E NOT DETECTED NOT DETECTED Final   Coronavirus HKU1 NOT DETECTED NOT DETECTED Final   Coronavirus NL63 NOT DETECTED NOT DETECTED Final   Coronavirus OC43 NOT DETECTED NOT DETECTED Final   Metapneumovirus NOT DETECTED NOT DETECTED Final   Rhinovirus / Enterovirus NOT DETECTED NOT DETECTED Final   Influenza A NOT DETECTED NOT DETECTED Final   Influenza B NOT DETECTED NOT DETECTED Final   Parainfluenza Virus 1 NOT DETECTED NOT DETECTED Final   Parainfluenza Virus 2 NOT DETECTED NOT DETECTED Final   Parainfluenza Virus 3 NOT DETECTED NOT DETECTED Final   Parainfluenza Virus 4 NOT DETECTED NOT DETECTED Final   Respiratory Syncytial Virus NOT DETECTED NOT DETECTED Final   Bordetella pertussis NOT DETECTED NOT DETECTED Final   Chlamydophila pneumoniae NOT DETECTED NOT DETECTED Final   Mycoplasma pneumoniae NOT DETECTED NOT DETECTED Final  Impression/Plan:  1. Septic arthritis - large effusion and debridement today.  Will watch cultures but otherwise stay on vancomycin.   2. Cough - improved. 3.  Leukocytosis - improved. Possibly due to effusion vs viral infection.

## 2016-06-27 NOTE — Anesthesia Procedure Notes (Signed)
Procedure Name: LMA Insertion Date/Time: 06/27/2016 5:31 PM Performed by: Merdis Delay Pre-anesthesia Checklist: Patient identified, Emergency Drugs available and Suction available Patient Re-evaluated:Patient Re-evaluated prior to inductionOxygen Delivery Method: Circle system utilized Preoxygenation: Pre-oxygenation with 100% oxygen Intubation Type: IV induction LMA: LMA inserted LMA Size: 4.0 Placement Confirmation: positive ETCO2,  CO2 detector and breath sounds checked- equal and bilateral Tube secured with: Tape Dental Injury: Teeth and Oropharynx as per pre-operative assessment

## 2016-06-27 NOTE — Anesthesia Preprocedure Evaluation (Signed)
Anesthesia Evaluation  Patient identified by MRN, date of birth, ID band Patient awake    Reviewed: Allergy & Precautions, NPO status , Patient's Chart, lab work & pertinent test results  Airway Mallampati: I  TM Distance: >3 FB Neck ROM: Full    Dental no notable dental hx. (+) Teeth Intact   Pulmonary pneumonia, resolved,    Pulmonary exam normal breath sounds clear to auscultation       Cardiovascular negative cardio ROS Normal cardiovascular exam Rhythm:Regular Rate:Normal     Neuro/Psych negative neurological ROS  negative psych ROS   GI/Hepatic negative GI ROS, Neg liver ROS,   Endo/Other    Renal/GU negative Renal ROS  negative genitourinary   Musculoskeletal  (+) Arthritis , Septic arthritis left knee   Abdominal   Peds  Hematology  (+) Sickle cell trait and anemia ,   Anesthesia Other Findings   Reproductive/Obstetrics                             Anesthesia Physical Anesthesia Plan  ASA: II  Anesthesia Plan: General   Post-op Pain Management:    Induction:   Airway Management Planned: LMA  Additional Equipment:   Intra-op Plan:   Post-operative Plan:   Informed Consent: I have reviewed the patients History and Physical, chart, labs and discussed the procedure including the risks, benefits and alternatives for the proposed anesthesia with the patient or authorized representative who has indicated his/her understanding and acceptance.   Dental advisory given  Plan Discussed with: CRNA, Anesthesiologist and Surgeon  Anesthesia Plan Comments:         Anesthesia Quick Evaluation

## 2016-06-28 ENCOUNTER — Encounter (HOSPITAL_COMMUNITY): Payer: Self-pay | Admitting: Orthopedic Surgery

## 2016-06-28 DIAGNOSIS — M009 Pyogenic arthritis, unspecified: Principal | ICD-10-CM

## 2016-06-28 DIAGNOSIS — D644 Congenital dyserythropoietic anemia: Secondary | ICD-10-CM

## 2016-06-28 DIAGNOSIS — R531 Weakness: Secondary | ICD-10-CM

## 2016-06-28 DIAGNOSIS — Z9889 Other specified postprocedural states: Secondary | ICD-10-CM

## 2016-06-28 LAB — CBC
HEMATOCRIT: 27.5 % — AB (ref 36.0–46.0)
HEMOGLOBIN: 8.7 g/dL — AB (ref 12.0–15.0)
MCH: 25.3 pg — ABNORMAL LOW (ref 26.0–34.0)
MCHC: 31.6 g/dL (ref 30.0–36.0)
MCV: 79.9 fL (ref 78.0–100.0)
Platelets: 503 10*3/uL — ABNORMAL HIGH (ref 150–400)
RBC: 3.44 MIL/uL — ABNORMAL LOW (ref 3.87–5.11)
RDW: 23.2 % — ABNORMAL HIGH (ref 11.5–15.5)
WBC: 16.4 10*3/uL — ABNORMAL HIGH (ref 4.0–10.5)

## 2016-06-28 LAB — BASIC METABOLIC PANEL
Anion gap: 7 (ref 5–15)
BUN: 5 mg/dL — AB (ref 6–20)
CHLORIDE: 105 mmol/L (ref 101–111)
CO2: 25 mmol/L (ref 22–32)
CREATININE: 0.66 mg/dL (ref 0.44–1.00)
Calcium: 9.1 mg/dL (ref 8.9–10.3)
GFR calc Af Amer: >60 mL/min (ref >60)
GFR calc non Af Amer: >60 mL/min (ref >60)
Glucose, Bld: 105 mg/dL — ABNORMAL HIGH (ref 65–99)
Potassium: 3.9 mmol/L (ref 3.5–5.1)
Sodium: 137 mmol/L (ref 135–145)

## 2016-06-28 LAB — CULTURE, BLOOD (ROUTINE X 2)
CULTURE: NO GROWTH
Culture: NO GROWTH

## 2016-06-28 NOTE — Progress Notes (Signed)
PROGRESS NOTE        PATIENT DETAILS Name: Angie Wright Age: 61 y.o. Sex: female Date of Birth: Mar 12, 1956 Admit Date: 06/23/2016 Admitting Physician Edwin Dada, MD PCP:No PCP Per Patient  Brief Narrative: Patient is a 61 y.o. female with a h/o iron deficiency anemia and recent S. pneumo septic arthritis of the left knee. The pt. Was admitted in December with left knee swelling, found to have septic arthritis debrided On 06/01/16, it grew S pneumo. She was discharged home on ceftriaxone 2g daily. Despite the abx sensitivity and selection being appropriate, she was readmitted on 06/23/16  with more swelling of the left knee. See details below.   Subjective:  Patient is a 61 y.o. African [place] female who appears in a pleasant mood despite discomfort from left knee pain.   Assessment/Plan: Septic arthritis of knee, left: Afebrile, continues to have leukocytosis. She underwent right knee arthroscopy and I&D on 1/23-intraoperative cultures negative so far.Previous episode in December 2017 cultures grew S. Pneumoniae. Plans are to continue intravenous vancomycin, and await infectious disease follow-up regarding outpatient antibiotic regimen/duration. Orthopedics recommending a bearing as tolerated, and  daily 81 mg aspirin for DVT prophylaxis 4 weeks  Microcytic anemia: Anemia panel consistent with anemia of chronic disease, appears to be persistently anemic since her initial presentation December 2017. No prior labs to compare with. Hemoglobin currently stable after 2 units of PRBC this admission. Have reached out to her PCPs office to see if we can obtain prior labs. Follow for now  ?Pneumonia: Suggested on chest x-ray-however no clinical symptoms of pneumonia. Already on antimicrobial therapy-and will not change management    Generalized weakness: Due to recent hospitalizations, and inability to ambulate well due to painful septic knee joint. Continue PT  evaluation and tx.     DVT Prophylaxis: SCD's Heparin 5,000 units [place] Q8H  Code Status: Full code  Family Communication: Spouse at bedside  Disposition Plan: Remain inpatient  Antimicrobial agents: Anti-infectives    Start     Dose/Rate Route Frequency Ordered Stop   06/27/16 2200  vancomycin (VANCOCIN) IVPB 750 mg/150 ml premix     750 mg 150 mL/hr over 60 Minutes Intravenous Every 12 hours 06/27/16 0607     06/27/16 1100  ceFAZolin (ANCEF) IVPB 2g/100 mL premix     2 g 200 mL/hr over 30 Minutes Intravenous To [facility] 06/27/16 1047 06/27/16 1728   06/24/16 0300  ceFEPIme (MAXIPIME) 1 g in dextrose 5 % 50 mL IVPB  Status:  Discontinued     1 g 100 mL/hr over 30 Minutes Intravenous Every 8 hours 06/24/16 0143 06/25/16 1508   06/23/16 1845  ceFEPIme (MAXIPIME) 1 g in dextrose 5 % 50 mL IVPB     1 g 100 mL/hr over 30 Minutes Intravenous  Once 06/23/16 1818 06/23/16 2015   06/23/16 1830  vancomycin (VANCOCIN) IVPB 1000 mg/200 mL premix  Status:  Discontinued     1,000 mg 200 mL/hr over 60 Minutes Intravenous Every 12 hours 06/23/16 1825 06/27/16 0604      Procedures: None at this time  CONSULTS: None at this time  Time spent: 25 minutes-Greater than 50% of this time was spent in counseling, explanation of diagnosis, planning of further management, and coordination of care.  MEDICATIONS: Scheduled Meds: . heparin subcutaneous  5,000 Units Subcutaneous Q8H  . vancomycin  750 mg Intravenous Q12H   Continuous Infusions: PRN Meds:.acetaminophen **OR** acetaminophen, chlorpheniramine-HYDROcodone, HYDROcodone-acetaminophen, metoCLOPramide **OR** metoCLOPramide (REGLAN) injection, ondansetron **OR** ondansetron (ZOFRAN) IV, polyethylene glycol, sodium chloride flush   PHYSICAL EXAM: Vital signs: Vitals:   06/27/16 2000 06/28/16 0100 06/28/16 0512 06/28/16 1023  BP: (!) 134/55 (!) 143/62 (!) 137/59 (!) 135/54  Pulse: 100 99 90 94  Resp: 20 18 18 18   Temp:  98.1 F (36.7 C) 98.6 F (37 C) 98 F (36.7 C) 98.2 F (36.8 C)  TempSrc: Oral Oral Oral Oral  SpO2: 100% 100% 98% 99%  Weight:       Filed Weights   06/26/16 1200  Weight: 78 kg (171 lb 15.3 oz)   Body mass index is 27.75 kg/m.   General appearance: Awake, alert, appears in slight discomfort. Speech is clear.  Eyes: PERRLA, no scleral icterus. Pink conjunctiva HEENT: Atraumatic and Normocephalic Neck: Supple, no JVD.  Resp:Good air entry b/l, lungs clear b/l to auscultation.  CVS: S1 S2 regular, no murmurs.  GI: Bowel sounds present, Non tender and not distended with no gaurding, rigidity or rebound. Extremities: Left knee is edematous 3+. Both legs are warm to touch.  Neurology:  Speech clear,Non focal, sensation is grossly intact.  Psychiatric: Normal judgment and insight. AAO x 3. Normal mood. Musculoskeletal:No digital cyanosis. ROM left LE limited by pain.  Skin: No Rash, warm and dry   I have personally reviewed following labs and imaging studies  LABORATORY DATA: CBC:  Recent Labs Lab 06/23/16 1828 06/23/16 2130 06/24/16 0647 06/25/16 0410 06/26/16 0700 06/27/16 0442 06/28/16 0519  WBC 23.4* 21.6* 19.8* 16.7* 12.3* 12.6* 16.4*  NEUTROABS 20.8* 18.6*  --  13.3* 10.5*  --   --   HGB 7.0* 6.2* 7.0* 6.6* 9.0* 8.6* 8.7*  HCT 21.7* 19.3* 22.1* 20.8* 27.5* 26.8* 27.5*  MCV 75.3* 74.5* 76.7* 77.6* 78.8 79.1 79.9  PLT 526* 478* 499* 498* 447* 515* 503*    Basic Metabolic Panel:  Recent Labs Lab 06/24/16 0647 06/25/16 0410 06/26/16 0700 06/27/16 0442 06/28/16 0519  NA 133* 135 135 133* 137  K 4.0 4.0 3.7 3.1* 3.9  CL 102 108 106 101 105  CO2 22 19* 23 24 25   GLUCOSE 100* 89 122* 94 105*  BUN 9 7 5* 5* 5*  CREATININE 0.59 0.66 0.58 0.60 0.66  CALCIUM 9.0 8.6* 8.7* 8.9 9.1    GFR: Estimated Creatinine Clearance: 77.9 mL/min (by C-G formula based on SCr of 0.66 mg/dL).  Liver Function Tests:  Recent Labs Lab 06/23/16 1828 06/24/16 0647  AST  57* 38  ALT 66* 50  ALKPHOS 486* 360*  BILITOT 0.9 0.8  PROT 8.2* 7.1  ALBUMIN 2.1* 1.8*   No results for input(s): LIPASE, AMYLASE in the last 168 hours. No results for input(s): AMMONIA in the last 168 hours.  Coagulation Profile: No results for input(s): INR, PROTIME in the last 168 hours.  Cardiac Enzymes: No results for input(s): CKTOTAL, CKMB, CKMBINDEX, TROPONINI in the last 168 hours.  BNP (last 3 results) No results for input(s): PROBNP in the last 8760 hours.  HbA1C: No results for input(s): HGBA1C in the last 72 hours.  CBG: No results for input(s): GLUCAP in the last 168 hours.  Lipid Profile: No results for input(s): CHOL, HDL, LDLCALC, TRIG, CHOLHDL, LDLDIRECT in the last 72 hours.  Thyroid Function Tests: No results for input(s): TSH, T4TOTAL, FREET4, T3FREE, THYROIDAB in the last 72 hours.  Anemia Panel:  Recent Labs  06/26/16  0754  VITAMINB12 1,615*  FOLATE 14.5  FERRITIN 976*  TIBC 132*  IRON 19*  RETICCTPCT 4.7*    Urine analysis:    Component Value Date/Time   COLORURINE YELLOW 06/24/2016 0110   APPEARANCEUR HAZY (A) 06/24/2016 0110   LABSPEC 1.012 06/24/2016 0110   PHURINE 6.0 06/24/2016 0110   GLUCOSEU NEGATIVE 06/24/2016 0110   HGBUR MODERATE (A) 06/24/2016 0110   BILIRUBINUR NEGATIVE 06/24/2016 0110   KETONESUR NEGATIVE 06/24/2016 0110   PROTEINUR 100 (A) 06/24/2016 0110   NITRITE NEGATIVE 06/24/2016 0110   LEUKOCYTESUR NEGATIVE 06/24/2016 0110    Sepsis Labs: Lactic Acid, Venous    Component Value Date/Time   LATICACIDVEN 0.95 06/23/2016 1849    MICROBIOLOGY: Recent Results (from the past 240 hour(s))  Blood Culture (routine x 2)     Status: None (Preliminary result)   Collection Time: 06/23/16  6:15 PM  Result Value Ref Range Status   Specimen Description BLOOD LEFT WRIST  Final   Special Requests IN PEDIATRIC BOTTLE 3CC  Final   Culture NO GROWTH 4 DAYS  Final   Report Status PENDING  Incomplete  Blood Culture  (routine x 2)     Status: None (Preliminary result)   Collection Time: 06/23/16  6:28 PM  Result Value Ref Range Status   Specimen Description BLOOD LEFT ARM  Final   Special Requests BOTTLES DRAWN AEROBIC AND ANAEROBIC 5CC  Final   Culture NO GROWTH 4 DAYS  Final   Report Status PENDING  Incomplete  Urine culture     Status: Abnormal   Collection Time: 06/24/16  1:10 AM  Result Value Ref Range Status   Specimen Description URINE, RANDOM  Final   Special Requests NONE  Final   Culture <10,000 COLONIES/mL INSIGNIFICANT GROWTH (A)  Final   Report Status 06/25/2016 FINAL  Final  Respiratory Panel by PCR     Status: None   Collection Time: 06/24/16  3:41 PM  Result Value Ref Range Status   Adenovirus NOT DETECTED NOT DETECTED Final   Coronavirus 229E NOT DETECTED NOT DETECTED Final   Coronavirus HKU1 NOT DETECTED NOT DETECTED Final   Coronavirus NL63 NOT DETECTED NOT DETECTED Final   Coronavirus OC43 NOT DETECTED NOT DETECTED Final   Metapneumovirus NOT DETECTED NOT DETECTED Final   Rhinovirus / Enterovirus NOT DETECTED NOT DETECTED Final   Influenza A NOT DETECTED NOT DETECTED Final   Influenza B NOT DETECTED NOT DETECTED Final   Parainfluenza Virus 1 NOT DETECTED NOT DETECTED Final   Parainfluenza Virus 2 NOT DETECTED NOT DETECTED Final   Parainfluenza Virus 3 NOT DETECTED NOT DETECTED Final   Parainfluenza Virus 4 NOT DETECTED NOT DETECTED Final   Respiratory Syncytial Virus NOT DETECTED NOT DETECTED Final   Bordetella pertussis NOT DETECTED NOT DETECTED Final   Chlamydophila pneumoniae NOT DETECTED NOT DETECTED Final   Mycoplasma pneumoniae NOT DETECTED NOT DETECTED Final  Surgical pcr screen     Status: None   Collection Time: 06/27/16 11:03 AM  Result Value Ref Range Status   MRSA, PCR NEGATIVE NEGATIVE Final   Staphylococcus aureus NEGATIVE NEGATIVE Final    Comment:        The Xpert SA Assay (FDA approved for NASAL specimens in patients over 21 years of age), is one  component of a comprehensive surveillance program.  Test performance has been validated by [facility] for patients greater than or equal to 99 year old. It is not intended to diagnose infection nor to guide or  monitor treatment.   Aerobic/Anaerobic Culture (surgical/deep wound)     Status: None (Preliminary result)   Collection Time: 06/27/16  5:46 PM  Result Value Ref Range Status   Specimen Description SYNOVIAL KNEE FLUID  Final   Special Requests SWABS  Final   Gram Stain   Final    ABUNDANT WBC PRESENT, PREDOMINANTLY PMN NO ORGANISMS SEEN    Culture PENDING  Incomplete   Report Status PENDING  Incomplete    RADIOLOGY STUDIES/RESULTS: Dg Chest 2 View  Result Date: 06/26/2016 CLINICAL DATA:  Cough and congestion. EXAM: CHEST  2 VIEW COMPARISON:  06/23/2016. FINDINGS: Right PICC line noted with tip at cavoatrial junction. Heart size normal. Diffuse bilateral interstitial prominence again noted. Findings suggest interstitial edema and/or interstitial pneumonitis. Bibasilar subsegmental atelectasis. Tiny bilateral pleural effusions. No pneumothorax . IMPRESSION: 1. Right PICC line stable position. 2. Diffuse bilateral from interstitial prominence again noted. Bibasilar subsegmental atelectasis. Findings suggest interstitial edema and/or pneumonitis. Tiny bilateral pleural effusions again noted . Electronically Signed   By: Marcello Moores  Register   On: 06/26/2016 10:33   Dg Chest 2 View  Result Date: 06/23/2016 CLINICAL DATA:  Elevated WBC, cough EXAM: CHEST  2 VIEW COMPARISON:  03/31/2016 FINDINGS: Right-sided PICC line with the tip projecting over the lower SVC. Bilateral trace pleural effusions. Left lower lobe airspace disease. Minimal right lower lobe airspace disease. Bilateral interstitial thickening. No pneumothorax. Stable cardiomegaly. Moderate osteoarthritis of bilateral glenohumeral joints. IMPRESSION: 1. Right-sided PICC line with the tip projecting over the lower SVC. 2. Bilateral  interstitial thickening and bilateral lower lobe airspace disease which may reflect interstitial edema and atelectasis versus multilobar pneumonia. Electronically Signed   By: Kathreen Devoid   On: 06/23/2016 17:50   Dg Chest 2 View  Result Date: 06/01/2016 CLINICAL DATA:  Acute onset of fever and disorientation. Initial encounter. EXAM: CHEST  2 VIEW COMPARISON:  None. FINDINGS: The lungs are well-aerated. Vascular congestion is noted. Minimal bilateral opacities may reflect atelectasis or possibly mild infection. There is no evidence of pleural effusion or pneumothorax. The heart is enlarged. No acute osseous abnormalities are seen. Chronic sclerosis is seen at the humeral heads bilaterally. IMPRESSION: Vascular congestion and cardiomegaly noted. Minimal bilateral opacities may reflect atelectasis or possibly mild infection. Electronically Signed   By: Garald Balding M.D.   On: 06/01/2016 00:03   Dg Knee 1-2 Views Left  Result Date: 06/25/2016 CLINICAL DATA:  Pain and swelling of the left knee. Knee surgery few weeks ago. EXAM: LEFT KNEE - 1-2 VIEW COMPARISON:  06/01/2016 FINDINGS: Tricompartmental osteoarthritis with joint space narrowing and spurring as before without acute fracture nor bone destruction. No intra-articular loose bodies. Suprapatellar surgical drain has been removed since prior comparison. Suprapatellar moderate to large joint effusion. 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 No acute osseous abnormality or bone destruction. Electronically Signed   By: Ashley Royalty M.D.   On: 06/25/2016 23:14   Mr Foot Right W Wo Contrast  Result Date: 06/02/2016 CLINICAL DATA:  Right dorsal foot pain and swelling since Tuesday. No known injury. EXAM: MRI OF THE RIGHT FOREFOOT WITHOUT AND WITH CONTRAST TECHNIQUE: Multiplanar, multisequence MR imaging of the right foot was performed before and after the administration of intravenous contrast. CONTRAST:  21mL  MULTIHANCE GADOBENATE DIMEGLUMINE 529 MG/ML IV SOLN COMPARISON:  05/31/2016 radiographs of the right foot FINDINGS: Bones/Joint/Cartilage Mild reactive marrow edema about the second TMT joint with joint space narrowing and subchondral cyst at the base of the  second metatarsal are consistent with osteoarthritis. There is marrow edema involving the proximal second metatarsal shaft with faint hypointense linear foci potentially representing a the subtle stress fracture. No findings of acute osteomyelitis. Sub chondral cystic change of the medial and mid cuneiform are noted. Mild hallux valgus. Calcaneal enthesophyte along the plantar aspect. No acute abnormality of the plantar fascia nor Achilles. Ligaments Nonacute Muscles and Tendons The tendons crossing the ankle joint appear intact. Soft tissues Mild diffuse soft tissue swelling along the plantar dorsal aspect of the foot. 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 Reactive marrow edema about the 2nd TMT joint involving the second metatarsal and middle cuneiform. Further distally within the proximal second metatarsal shaft is marrow edema with minimal faint linear hypointensity seen on some of the images suggesting a possible stress fracture. No findings of acute osteomyelitis. Diffuse mild nonspecific soft tissue edema of the foot, query cellulitis. Electronically Signed   By: Ashley Royalty M.D.   On: 06/02/2016 20:27   Mr Knee Left Wo Contrast  Result Date: 06/26/2016 CLINICAL DATA:  Chronic left knee pain and swelling. EXAM: MRI OF THE LEFT KNEE WITHOUT CONTRAST TECHNIQUE: Multiplanar, multisequence MR imaging of the knee was performed. No intravenous contrast was administered. COMPARISON:  Radiographs 06/25/2016 FINDINGS: Exam is significantly limited by patient motion. MENISCI Medial meniscus:  Severely degenerated and torn. Lateral  meniscus:  Severely degenerated and torn. LIGAMENTS Cruciates: Severe mucoid degeneration of the ACL. The PCL is intact. Collaterals:  Grossly intact.  Mcl and pes anserine bursitis noted. CARTILAGE Patellofemoral:  Moderate degenerative chondrosis/ chondromalacia. Medial: Advanced degenerative chondrosis with full or near full-thickness cartilage loss, joint space narrowing, osteophytic spurring and subchondral edema. Lateral: Advanced degenerative chondrosis with areas of full thickness cartilage loss, joint space narrowing and spurring. Joint:  Large joint effusion and moderate synovitis. Popliteal Fossa:  Small Baker's cyst. Extensor Mechanism: The patella retinacular structures are intact and the quadriceps and patellar tendons are intact. Bones: No acute bony findings. 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 Other: The knee musculature is grossly normal. IMPRESSION: 1. Very limited examination due to patient motion. 2. Significant tricompartmental degenerative changes. 3. Degenerated and torn menisci. 4. Grossly intact ligamentous structures. Mucoid degeneration of the ACL is noted. 5. Large joint effusion and moderate synovitis. Electronically Signed   By: Marijo Sanes M.D.   On: 06/26/2016 17:11   Dg Knee Left Port  Result Date: 06/01/2016 CLINICAL DATA:  Patient status post debridement of the left knee with drain placement. EXAM: PORTABLE LEFT KNEE - 1-2 VIEW COMPARISON:  None. FINDINGS: Drainage catheter projects over the anterior knee. Tricompartmental osteoarthritis. Normal anatomic alignment. No evidence for acute fracture. IMPRESSION: Drain projects over the anterior knee soft tissues. Tricompartmental osteoarthritis. Electronically Signed   By: Lovey Newcomer M.D.   On: 06/01/2016 10:05   Dg Foot Complete Right  Result Date: 06/01/2016 CLINICAL DATA:  Acute onset of right dorsal foot pain and swelling. Initial encounter. EXAM: RIGHT FOOT COMPLETE - 3+ VIEW COMPARISON:  None. FINDINGS: There is no  evidence of fracture or dislocation. Degenerative change is noted at the medial aspect of the midfoot. There is no evidence of talar subluxation; the subtalar joint is unremarkable in appearance. A plantar calcaneal spur is seen. No significant soft tissue abnormalities are seen. IMPRESSION: 1. No evidence of fracture or dislocation. 2. Degenerative change at the medial aspect of the midfoot.  Electronically Signed   By: Garald Balding M.D.   On: 06/01/2016 00:04   US Abdomen Limited Ruq  Result Date: 06/01/2016 CLINICAL DATA:  Acute onset of fever.  Initial encounter. EXAM: US ABDOMEN LIMITED - RIGHT UPPER QUADRANT COMPARISON:  None. FINDINGS: Gallbladder: Multiple layering stones are seen dependently within the gallbladder, measuring up to 9 mm in size. No gallbladder wall thickening or pericholecystic fluid is seen. No ultrasonographic Murphy's sign is elicited. Common bile duct: Diameter: 0.4 cm, within normal limits in caliber. Liver: No focal lesion identified. Within normal limits in parenchymal echogenicity. IMPRESSION: Cholelithiasis. Gallbladder otherwise unremarkable. No evidence for cholecystitis or obstruction. Electronically Signed   By: Garald Balding M.D.   On: 06/01/2016 01:19     LOS: 4 days   Rose Clousing, PAS  [employer]   If 7PM-7AM, please contact night-coverage [email or web address] Password [facility] 06/28/2016, 1:12 PM  Attending MD note  Patient was seen, examined,treatment plan was discussed with the PA-.  I have personally reviewed the clinical findings, lab, imaging studies and management of this patient in detail. I agree with the documentation, as recorded    Patient is doing well, pain well controlled.   On Exam: Gen. exam: Awake, alert, not in any distress Chest: Good air entry bilaterally, no rhonchi or rales CVS: S1-S2 regular, no murmurs Abdomen: Soft, nontender and nondistended Neurology: Non-focal Skin: No rash or lesions  Impression: Recurrent left  knee arthritis Recent streptococcal bacteremia  Plan Continue Vanco Await intraoperative cultures  Rest as above  [facility]

## 2016-06-28 NOTE — Evaluation (Signed)
Physical Therapy Evaluation Patient Details Name: Angie Wright MRN: [id number] DOB: March 21, 1956 Today's Date: 06/28/2016   History of Present Illness  Angie Wright is a 61 y.o. female with a past medical history significant for iron deficiency anemia and recent S pneumo septic arthritis of hte LEFT knee .  Underwent left Knee I&D/arthroscopy on 06/27/16.  Clinical Impression  Pt admitted with above diagnosis. Pt currently with functional limitations due to the deficits listed below (see PT Problem List). Pt ambulating with RW with min guard assist with overall good safety.  Daughter present and states pt husband and herself will assist pt at home.  Will continue to follow acutely.   Pt will benefit from skilled PT to increase their independence and safety with mobility to allow discharge to the venue listed below.      Follow Up Recommendations Outpatient PT;Supervision/Assistance - 24 hour (Pt was set up for Outpt PT prior to readmit)    Equipment Recommendations  None recommended by PT    Recommendations for Other Services       Precautions / Restrictions Precautions Precautions: Fall Restrictions Weight Bearing Restrictions: Yes LLE Weight Bearing: Weight bearing as tolerated      Mobility  Bed Mobility Overal bed mobility: Needs Assistance Bed Mobility: Supine to Sit     Supine to sit: Min assist     General bed mobility comments: Only needed min assist to assist left LE off bed.   Transfers Overall transfer level: Needs assistance Equipment used: Rolling walker (2 wheeled) Transfers: Sit to/from Stand Sit to Stand: Min guard         General transfer comment: guard assist for sit to stand with cues for hand placement.   Ambulation/Gait Ambulation/Gait assistance: Min guard;Min assist Ambulation Distance (Feet): 65 Feet Assistive device: Rolling walker (2 wheeled) Gait Pattern/deviations: Step-to pattern;Decreased step length - left;Decreased stance time -  left;Decreased weight shift to left;Antalgic;Trunk flexed;Wide base of support   Gait velocity interpretation: Below normal speed for age/gender General Gait Details: Pt needed initial cues to sequence steps and RW however began to do better once she got going.  Safe with RW overall.  Fatigues quickly.   Stairs            Wheelchair Mobility    Modified Rankin (Stroke Patients Only)       Balance Overall balance assessment: Needs assistance Sitting-balance support: No upper extremity supported;Feet supported Sitting balance-Leahy Scale: Fair     Standing balance support: Bilateral upper extremity supported;During functional activity Standing balance-Leahy Scale: Poor Standing balance comment: Pt reliant on BUE support to maintain static standing balance                             Pertinent Vitals/Pain Pain Assessment: Faces Faces Pain Scale: Hurts even more Pain Location: L knee  Pain Descriptors / Indicators: Grimacing;Guarding;Sore;Sharp Pain Intervention(s): Limited activity within patient's tolerance;Monitored during session;Premedicated before session;Repositioned  VSS    Home Living Family/patient expects to be discharged to:: Private residence Living Arrangements: Spouse/significant other Available Help at Discharge: Family (husband and daughter) Type of Home: House Home Access: Stairs to enter Entrance Stairs-Rails: Right Entrance Stairs-Number of Steps: 3 Home Layout: Multi-level Home Equipment: Cane - single point;Bedside commode;Walker - 2 wheels;Shower seat;Hand held shower head      Prior Function Level of Independence: Independent with assistive device(s)               Hand  Dominance   Dominant Hand: Right    Extremity/Trunk Assessment   Upper Extremity Assessment Upper Extremity Assessment: Defer to OT evaluation    Lower Extremity Assessment Lower Extremity Assessment: LLE deficits/detail LLE Deficits / Details: hip  and knee 2+/5    Cervical / Trunk Assessment Cervical / Trunk Assessment: Kyphotic  Communication   Communication: No difficulties  Cognition Arousal/Alertness: Awake/alert Behavior During Therapy: WFL for tasks assessed/performed Overall Cognitive Status: Within Functional Limits for tasks assessed                      General Comments General comments (skin integrity, edema, etc.): daughter in room.  Placed towel roll under heel to encourage knee extension.     Exercises Total Joint Exercises Ankle Circles/Pumps: AROM;Both;10 reps;Supine Quad Sets: AROM;Left;10 reps;Supine Heel Slides: AAROM;Left;5 reps;Supine   Assessment/Plan    PT Assessment Patient needs continued PT services  PT Problem List Decreased strength;Decreased range of motion;Decreased activity tolerance;Decreased balance;Decreased mobility;Decreased knowledge of use of DME;Decreased safety awareness;Decreased knowledge of precautions;Pain          PT Treatment Interventions DME instruction;Gait training;Stair training;Therapeutic activities;Therapeutic exercise;Balance training;Patient/family education;Functional mobility training    PT Goals (Current goals can be found in the Care Plan section)  Acute Rehab PT Goals Patient Stated Goal: feel better PT Goal Formulation: With patient Time For Goal Achievement: 07/05/16 Potential to Achieve Goals: Good    Frequency Min 5X/week   Barriers to discharge        Co-evaluation               End of Session Equipment Utilized During Treatment: Gait belt Activity Tolerance: Patient limited by fatigue;Patient limited by pain Patient left: in chair;with call bell/phone within reach;with family/visitor present Nurse Communication: Mobility status         Time: [id number] PT Time Calculation (min) (ACUTE ONLY): 21 min   Charges:   PT Evaluation $PT Eval Moderate Complexity: 1 Procedure     PT G Codes:        Godfrey Pick Siniya Lichty 07/01/2016,  2:45 PM  [facility] Acute Rehabilitation [phone] (pager)

## 2016-06-28 NOTE — Progress Notes (Addendum)
[facility] for Infectious Disease   Reason for visit: Follow up on septic arthritis  Interval History: washout last evening and no pus, extensive synovitis.  No fever, no complaints  Physical Exam: Constitutional:  Vitals:   06/28/16 1023 06/28/16 1412  BP: (!) 135/54 (!) 135/55  Pulse: 94 99  Resp: 18 18  Temp: 98.2 F (36.8 C) 98.5 F (36.9 C)   patient appears in NAD Respiratory: Normal respiratory effort; CTA B Cardiovascular: RRR MS: left knee wrapped  Review of Systems: Constitutional: negative for fevers and chills Gastrointestinal: negative for diarrhea Integument/breast: negative for rash  Lab Results  Component Value Date   WBC 16.4 (H) 06/28/2016   HGB 8.7 (L) 06/28/2016   HCT 27.5 (L) 06/28/2016   MCV 79.9 06/28/2016   PLT 503 (H) 06/28/2016    Lab Results  Component Value Date   CREATININE 0.66 06/28/2016   BUN 5 (L) 06/28/2016   NA 137 06/28/2016   K 3.9 06/28/2016   CL 105 06/28/2016   CO2 25 06/28/2016    Lab Results  Component Value Date   ALT 50 06/24/2016   AST 38 06/24/2016   ALKPHOS 360 (H) 06/24/2016     Microbiology: Recent Results (from the past 240 hour(s))  Blood Culture (routine x 2)     Status: None   Collection Time: 06/23/16  6:15 PM  Result Value Ref Range Status   Specimen Description BLOOD LEFT WRIST  Final   Special Requests IN PEDIATRIC BOTTLE 3CC  Final   Culture NO GROWTH 5 DAYS  Final   Report Status 06/28/2016 FINAL  Final  Blood Culture (routine x 2)     Status: None   Collection Time: 06/23/16  6:28 PM  Result Value Ref Range Status   Specimen Description BLOOD LEFT ARM  Final   Special Requests BOTTLES DRAWN AEROBIC AND ANAEROBIC 5CC  Final   Culture NO GROWTH 5 DAYS  Final   Report Status 06/28/2016 FINAL  Final  Urine culture     Status: Abnormal   Collection Time: 06/24/16  1:10 AM  Result Value Ref Range Status   Specimen Description URINE, RANDOM  Final   Special Requests NONE  Final   Culture <10,000 COLONIES/mL INSIGNIFICANT GROWTH (A)  Final   Report Status 06/25/2016 FINAL  Final  Respiratory Panel by PCR     Status: None   Collection Time: 06/24/16  3:41 PM  Result Value Ref Range Status   Adenovirus NOT DETECTED NOT DETECTED Final   Coronavirus 229E NOT DETECTED NOT DETECTED Final   Coronavirus HKU1 NOT DETECTED NOT DETECTED Final   Coronavirus NL63 NOT DETECTED NOT DETECTED Final   Coronavirus OC43 NOT DETECTED NOT DETECTED Final   Metapneumovirus NOT DETECTED NOT DETECTED Final   Rhinovirus / Enterovirus NOT DETECTED NOT DETECTED Final   Influenza A NOT DETECTED NOT DETECTED Final   Influenza B NOT DETECTED NOT DETECTED Final   Parainfluenza Virus 1 NOT DETECTED NOT DETECTED Final   Parainfluenza Virus 2 NOT DETECTED NOT DETECTED Final   Parainfluenza Virus 3 NOT DETECTED NOT DETECTED Final   Parainfluenza Virus 4 NOT DETECTED NOT DETECTED Final   Respiratory Syncytial Virus NOT DETECTED NOT DETECTED Final   Bordetella pertussis NOT DETECTED NOT DETECTED Final   Chlamydophila pneumoniae NOT DETECTED NOT DETECTED Final   Mycoplasma pneumoniae NOT DETECTED NOT DETECTED Final  Surgical pcr screen     Status: None   Collection Time: 06/27/16 11:03 AM  Result Value Ref Range Status   MRSA, PCR NEGATIVE NEGATIVE Final   Staphylococcus aureus NEGATIVE NEGATIVE Final    Comment:        The Xpert SA Assay (FDA approved for NASAL specimens in patients over 55 years of age), is one component of a comprehensive surveillance program.  Test performance has been validated by [facility] for patients greater than or equal to 56 year old. It is not intended to diagnose infection nor to guide or monitor treatment.   Aerobic/Anaerobic Culture (surgical/deep wound)     Status: None (Preliminary result)   Collection Time: 06/27/16  5:46 PM  Result Value Ref Range Status   Specimen Description SYNOVIAL KNEE FLUID  Final   Special Requests SWABS  Final   Gram Stain    Final    ABUNDANT WBC PRESENT, PREDOMINANTLY PMN NO ORGANISMS SEEN    Culture NO GROWTH < 24 HOURS  Final   Report Status PENDING  Incomplete    Impression/Plan:  1. Septic arthritis - now with washout. Continuing on vancomycin.  Continue vancomycin through February 5th or until ID follow up Don't pull picc at the end of treatment until seen by IDMD Will arrange follow up in our office with Dr. Baxter Flattery  2.  Leukocytosis - up to 16 post op today.  Will monitor  I will sign off, thanks

## 2016-06-28 NOTE — Progress Notes (Signed)
[facility]  Active pt with [facility] prior to this hospital readmission  [facility] is providing [facility], PT and Home Infusion Services for home IV ABX.  [facility] hospital team will follow pt while she is here to support transition home at DC and ensure [facility] needs are met.  If patient discharges after hours, please call [phone].   Larry Sierras 06/28/2016, 5:15 PM

## 2016-06-29 DIAGNOSIS — M00262 Other streptococcal arthritis, left knee: Secondary | ICD-10-CM

## 2016-06-29 DIAGNOSIS — D72829 Elevated white blood cell count, unspecified: Secondary | ICD-10-CM

## 2016-06-29 LAB — CBC
HCT: 23.8 % — ABNORMAL LOW (ref 36.0–46.0)
HEMOGLOBIN: 7.5 g/dL — AB (ref 12.0–15.0)
MCH: 25.3 pg — AB (ref 26.0–34.0)
MCHC: 31.5 g/dL (ref 30.0–36.0)
MCV: 80.1 fL (ref 78.0–100.0)
PLATELETS: 508 10*3/uL — AB (ref 150–400)
RBC: 2.97 MIL/uL — ABNORMAL LOW (ref 3.87–5.11)
RDW: 23.6 % — ABNORMAL HIGH (ref 11.5–15.5)
WBC: 12.4 10*3/uL — ABNORMAL HIGH (ref 4.0–10.5)

## 2016-06-29 MED ORDER — HEPARIN SOD (PORK) LOCK FLUSH 100 UNIT/ML IV SOLN
250.0000 [IU] | INTRAVENOUS | Status: AC | PRN
  Administered 2016-06-29: 250 [IU]

## 2016-06-29 MED ORDER — FERROUS SULFATE 325 (65 FE) MG PO TABS: 325.0000 mg | ORAL_TABLET | Freq: Two times a day (BID) | ORAL | 0 refills | Status: DC

## 2016-06-29 MED ORDER — VANCOMYCIN IV (FOR PTA / DISCHARGE USE ONLY): 750.0000 mg | Freq: Two times a day (BID) | INTRAVENOUS | 0 refills | Status: AC

## 2016-06-29 MED ORDER — FERROUS SULFATE 325 (65 FE) MG PO TABS: 325.0000 mg | ORAL_TABLET | Freq: Two times a day (BID) | ORAL | Status: DC

## 2016-06-29 MED ORDER — HYDROCODONE-ACETAMINOPHEN 7.5-325 MG PO TABS: 1.0000 | ORAL_TABLET | Freq: Four times a day (QID) | ORAL | 0 refills | Status: DC | PRN

## 2016-06-29 MED ORDER — ASPIRIN EC 81 MG PO TBEC: 81.0000 mg | DELAYED_RELEASE_TABLET | Freq: Every day | ORAL | 0 refills | Status: DC

## 2016-06-29 NOTE — Progress Notes (Signed)
Angie Wright to be D/C'd  per MD order. Discussed with the patient and all questions fully answered.  VSS, Skin clean, dry and intact without evidence of skin break down, no evidence of skin tears noted.  Patient being d/c with PICC line, IV team has disconnected and flushed Iv, home health infusion services has come by and done teaching with family/patient.  An After Visit Summary was printed and given to the patient. Patient received prescription.  D/c education completed with patient/family including follow up instructions, medication list, d/c activities limitations if indicated, with other d/c instructions as indicated by MD - patient able to verbalize understanding, all questions fully answered.   Patient instructed to return to ED, call 911, or call MD for any changes in condition.   Patient to be escorted via [place], and D/C home via private auto.

## 2016-06-29 NOTE — Progress Notes (Signed)
Physical Therapy Treatment Patient Details Name: Angie Wright MRN: [id number] DOB: Apr 14, 1956 Today's Date: 06/29/2016    History of Present Illness Angie Wright is a 61 y.o. female with a past medical history significant for iron deficiency anemia and recent S pneumo septic arthritis of hte LEFT knee .  Underwent left Knee I&D/arthroscopy on 06/27/16.    PT Comments    Pt admitted with above diagnosis. Pt currently with functional limitations due to balance and endurance deficits. Pt ambulated with supervision with RW with good safety.  Pt encouraged to continue Exercises 2-3 x day.  Reviewed with daughter and pt.  Supposed to d/c today.  Pt will benefit from skilled PT to increase their independence and safety with mobility to allow discharge to the venue listed below.    Follow Up Recommendations  Outpatient PT;Supervision/Assistance - 24 hour (Pt was set up for Outpt PT prior to readmit)     Equipment Recommendations  None recommended by PT    Recommendations for Other Services       Precautions / Restrictions Precautions Precautions: Fall Restrictions Weight Bearing Restrictions: Yes LLE Weight Bearing: Weight bearing as tolerated    Mobility  Bed Mobility Overal bed mobility: Needs Assistance Bed Mobility: Supine to Sit     Supine to sit: Independent     General bed mobility comments: Slow moving but able to come to EOB without assist.   Transfers Overall transfer level: Needs assistance Equipment used: Rolling walker (2 wheeled) Transfers: Sit to/from Stand Sit to Stand: Supervision         General transfer comment: supervision for sit to stand with cues for hand placement only  Ambulation/Gait Ambulation/Gait assistance: Min guard Ambulation Distance (Feet): 120 Feet Assistive device: Rolling walker (2 wheeled) Gait Pattern/deviations: Step-to pattern;Decreased step length - left;Decreased stance time - left;Decreased weight shift to left;Antalgic;Trunk  flexed;Wide base of support   Gait velocity interpretation: Below normal speed for age/gender General Gait Details: Pt needed no cues to sequence steps and RW.  Safe with RW overall.    Stairs Stairs:  (refused steps as she states she knows how to do it)          Engineer, building services Rankin (Stroke Patients Only)       Balance Overall balance assessment: Needs assistance Sitting-balance support: No upper extremity supported;Feet supported Sitting balance-Leahy Scale: Fair     Standing balance support: Bilateral upper extremity supported;During functional activity Standing balance-Leahy Scale: Poor Standing balance comment: Pt reliant on BUE support to maintain static standing balance                    Cognition Arousal/Alertness: Awake/alert Behavior During Therapy: WFL for tasks assessed/performed Overall Cognitive Status: Within Functional Limits for tasks assessed                      Exercises Total Joint Exercises Ankle Circles/Pumps: AROM;Both;10 reps;Supine Quad Sets: AROM;Left;10 reps;Supine Heel Slides: AAROM;Left;5 reps;Supine Knee Flexion: AROM;Left;Seated;5 reps Goniometric ROM: 11-75 degrees    General Comments General comments (skin integrity, edema, etc.): husband and daughter in room.  Placed towel under heel to promote knee extension.       Pertinent Vitals/Pain Pain Assessment: 0-10 Pain Score: 9  Pain Location: L knee  Pain Descriptors / Indicators: Grimacing;Guarding;Sore;Sharp Pain Intervention(s): Limited activity within patient's tolerance;Monitored during session;Premedicated before session;Repositioned;Ice applied  VSS    Home Living  Prior Function            PT Goals (current goals can now be found in the care plan section) Acute Rehab PT Goals Patient Stated Goal: feel better Progress towards PT goals: Progressing toward goals    Frequency    Min 5X/week       PT Plan Current plan remains appropriate    Co-evaluation             End of Session Equipment Utilized During Treatment: Gait belt Activity Tolerance: Patient limited by fatigue;Patient limited by pain Patient left: in chair;with call bell/phone within reach;with family/visitor present     Time: 0940-1003 PT Time Calculation (min) (ACUTE ONLY): 23 min  Charges:  $Gait Training: 8-22 mins $Therapeutic Exercise: 8-22 mins                    G Codes:      Kery Batzel F Emony Dormer 07-27-16, 1:47 PM [facility] [facility] Acute Rehabilitation [phone] (pager)

## 2016-06-29 NOTE — Progress Notes (Signed)
   Subjective:  Patient reports pain as mild.  Has been tolerating PT just feeling sore after sessions.  Objective:   VITALS:   Vitals:   06/28/16 1023 06/28/16 1412 06/28/16 2041 06/29/16 0509  BP: (!) 135/54 (!) 135/55 130/60 (!) 119/50  Pulse: 94 99 93 90  Resp: 18 18 19 19   Temp: 98.2 F (36.8 C) 98.5 F (36.9 C) 98.2 F (36.8 C) 98.5 F (36.9 C)  TempSrc: Oral Oral Oral Oral  SpO2: 99% 99% 99% 99%  Weight:        Neurologically intact Neurovascular intact Sensation intact distally Intact pulses distally Dorsiflexion/Plantar flexion intact Compartment soft   Lab Results  Component Value Date   WBC 12.4 (H) 06/29/2016   HGB 7.5 (L) 06/29/2016   HCT 23.8 (L) 06/29/2016   MCV 80.1 06/29/2016   PLT 508 (H) 06/29/2016   BMET    Component Value Date/Time   NA 137 06/28/2016 0519   K 3.9 06/28/2016 0519   CL 105 06/28/2016 0519   CO2 25 06/28/2016 0519   GLUCOSE 105 (H) 06/28/2016 0519   BUN 5 (L) 06/28/2016 0519   CREATININE 0.66 06/28/2016 0519   CALCIUM 9.1 06/28/2016 0519   GFRNONAA >60 06/28/2016 0519   GFRAA >60 06/28/2016 0519     Assessment/Plan: 2 Days Post-Op   Principal Problem:   Leukocytosis Active Problems:   Septic arthritis of knee, left (HCC)   Anemia   Pneumonia   Generalized weakness   Up with therapy -continue WBAT -benign exam today -dressing change tomorrow -daily asa for dvt ppx   Nicholes Stairs 06/29/2016, 9:03 AM   Geralynn Rile, MD [phone]

## 2016-06-29 NOTE — Care Management Note (Signed)
Case Management Note  Patient Details  Name: ROYALTI DEWATERS MRN: [id number] Date of Birth: [birth year]  Subjective/Objective:                    Action/Plan:   Expected Discharge Date:  06/29/16               Expected Discharge Plan:  [place]  In-House Referral:     Discharge planning Services  CM Consult  Post Acute Care Choice:    Choice offered to:  Patient  DME Arranged:    DME Agency:     HH Arranged:  RN [place] Agency:  [place]  Status of Service:  Completed, signed off  If discussed at [place] of Stay Meetings, dates discussed:    Additional Comments:  Marilu Favre, RN 06/29/2016, 8:35 AM

## 2016-06-29 NOTE — Discharge Summary (Signed)
PATIENT DETAILS Name: Angie Wright Age: 61 y.o. Sex: female Date of Birth: 03/12/1956 MRN: [id number]. Admitting Physician: Edwin Dada, MD PCP:No PCP Per Patient  Admit Date: 06/23/2016 Discharge date: 06/29/2016  Recommendations for Outpatient Follow-up:  1. Follow up with PCP in 1-2 weeks 2. Please obtain BMP/CBC in one week 3. Follow intra-operative synovial fluid cultures till final 4. Please refer patient to hematology-has chronic anemia  Admitted From:  Home with home health services  Disposition: Home with home health services   Home Health:  Yes  Equipment/Devices: None  Discharge Condition: Stable  CODE STATUS: FULL CODE  Diet recommendation:   Regular  Brief Summary: See H&P, Labs, Consult and Test reports for all details in brief, Patient is a 61 y.o. female with a h/o iron deficiency anemia and recent S. pneumo septic arthritis of the left knee. The pt. Was admitted in December with left knee swelling, found to have septic arthritis debrided On 06/01/16, it grew S pneumo. She was discharged home on ceftriaxone 2g daily. Despite the abx sensitivity and selection being appropriate, she was readmitted on 06/23/16  with more swelling of the left knee. See details below.  Brief Hospital Course: Septic arthritis of knee, left: Afebrile,  Leukocytosis is now downtrending. She underwent right knee arthroscopy and I&D on 1/23-intraoperative cultures negative so far.Previous episode in December 2017 cultures grew S. Pneumoniae. Spoke with ID-Dr Comer-Plans are to continue intravenous vancomycin through 2/5. ID will arrange outpatient follow up. Orthopedics recommending a bearing as tolerated, and  daily 81 mg aspirin for DVT prophylaxis 4 weeks  Microcytic anemia: Anemia panel consistent with anemia of chronic disease, appears to be persistently anemic since her initial presentation December 2017. No prior labs to compare with. Hemoglobin currently stable  after 2 units of PRBC this admission. Reviewed most recent d/c summary-apparently patient had Fe def anemia-and was given IV Iron. Her Iron indices are now better-and she appears to have developed anemia of chronic disease due to left knee septic arthritis. Her stools are brown in color-and patient claims to have had a colonoscopy in the last 5 years that was negative for significant abnormalities. We will continue Fe supplementation, and have asked her to follow up with her PCP and she if she needs a referral to Hematology. Her spouse was at bedside-both of them claim understanding and agreement with this plan  ?Pneumonia: Suggested on chest x-ray-however no clinical symptoms of pneumonia. Already on antimicrobial therapy-and will not change management    Generalized weakness: Due to recent hospitalizations, and inability to ambulate well due to painful septic knee joint. Continue PT services at home  Procedures/Studies: 1/23>>Left knee arthroscopy for irrigation and debridement of septic arthritis  Discharge Diagnoses:  Principal Problem:   Leukocytosis Active Problems:   Septic arthritis of knee, left (HCC)   Anemia   Pneumonia   Generalized weakness  Discharge Instructions:  Activity:  WBAT   Discharge Instructions    Call MD for:  difficulty breathing, headache or visual disturbances    Complete by:  As directed    Call MD for:  redness, tenderness, or signs of infection (pain, swelling, redness, odor or green/yellow discharge around incision site)    Complete by:  As directed    Diet - low sodium heart healthy    Complete by:  As directed    Discharge instructions    Complete by:  As directed    Follow with Primary MD in 1 week  Please make  sure you follow with Dr Baxter Flattery at the [place] office will get in touch with you soon.   Please get a complete blood count and chemistry panel checked by your Primary MD at your next visit, and again as instructed by your Primary  MD.  Get Medicines reviewed and adjusted: Please take all your medications with you for your next visit with your Primary MD  Laboratory/radiological data: Please request your Primary MD to go over all hospital tests and procedure/radiological results at the follow up, please ask your Primary MD to get all Hospital records sent to his/her office.  In some cases, they will be blood work, cultures and biopsy results pending at the time of your discharge. Please request that your primary care M.D. follows up on these results.  Also Note the following: If you experience worsening of your admission symptoms, develop shortness of breath, life threatening emergency, suicidal or homicidal thoughts you must seek medical attention immediately by calling 911 or calling your MD immediately  if symptoms less severe.  You must read complete instructions/literature along with all the possible adverse reactions/side effects for all the Medicines you take and that have been prescribed to you. Take any new Medicines after you have completely understood and accpet all the possible adverse reactions/side effects.   Do not drive when taking Pain medications or sleeping medications (Benzodaizepines)  Do not take more than prescribed Pain, Sleep and Anxiety Medications. It is not advisable to combine anxiety,sleep and pain medications without talking with your primary care practitioner  Special Instructions: If you have smoked or chewed Tobacco  in the last 2 yrs please stop smoking, stop any regular Alcohol  and or any Recreational drug use.  Wear Seat belts while driving.  Please note: You were cared for by a hospitalist during your hospital stay. Once you are discharged, your primary care physician will handle any further medical issues. Please note that NO REFILLS for any discharge medications will be authorized once you are discharged, as it is imperative that you return to your primary care physician (or  establish a relationship with a primary care physician if you do not have one) for your post hospital discharge needs so that they can reassess your need for medications and monitor your lab values.   Home infusion instructions [facility] May follow [place] Dosing Protocol; May administer Cathflo as needed to maintain patency of vascular access device.; Flushing of vascular access device: per [facility] Protocol: 0.9% NaCl pre/post medica...    Complete by:  As directed    Instructions:  May follow [place] Dosing Protocol   Instructions:  May administer Cathflo as needed to maintain patency of vascular access device.   Instructions:  Flushing of vascular access device: per [facility] Protocol: 0.9% NaCl pre/post medication administration and prn patency; Heparin 100 u/ml, 28m for implanted ports and Heparin 10u/ml, 547mfor all other central venous catheters.   Instructions:  May follow [facility] Anaphylaxis Protocol for First Dose Administration in the home: 0.9% NaCl at 25-50 ml/hr to maintain IV access for protocol meds. Epinephrine 0.3 ml IV/IM PRN and Benadryl 25-50 IV/IM PRN s/s of anaphylaxis.   Instructions:  [facility] Coordinator (RN) to assist per patient IV care needs in the home PRN.   Increase activity slowly    Complete by:  As directed    Weight bearing as tolerated     Allergies as of 06/29/2016   No Known Allergies  Medication List    STOP taking these medications   cefTRIAXone 2 g in dextrose 5 % 50 mL     TAKE these medications   acetaminophen 500 MG tablet Commonly known as:  TYLENOL Take 1,000 mg by mouth every 6 (six) hours as needed for headache (pain).   aspirin EC 81 MG tablet Take 1 tablet (81 mg total) by mouth daily.   calcium carbonate 1250 (500 Ca) MG tablet Commonly known as:  OS-CAL - dosed in mg of elemental calcium Take 1 tablet by mouth daily with breakfast.   cholecalciferol 1000 units tablet Commonly known as:  VITAMIN D Take  1,000 Units by mouth daily.   ferrous sulfate 325 (65 FE) MG tablet Take 1 tablet (325 mg total) by mouth 2 (two) times daily with a meal.   HYDROcodone-acetaminophen 7.5-325 MG tablet Commonly known as:  NORCO Take 1 tablet by mouth every 6 (six) hours as needed for moderate pain.   multivitamin with minerals Tabs tablet Take 1 tablet by mouth daily.   polyethylene glycol packet Commonly known as:  MIRALAX / GLYCOLAX Take 17 g by mouth daily as needed for moderate constipation. What changed:  additional instructions   potassium chloride SA 20 MEQ tablet Commonly known as:  K-DUR,KLOR-CON Take 1 tablet (20 mEq total) by mouth daily.   vancomycin IVPB Inject 750 mg into the vein every 12 (twelve) hours. Indication:  septic arthritis Last Day of Therapy:  07/10/16 Labs - _0 /25/18 1840     Follow-up Information    Primary MD. Schedule an appointment as soon as possible for a visit in 1 week(s).        Carlyle Basques, MD Follow up.   Specialty:  Infectious Diseases Why:  office will call you with a follow up appointment-if you do not hear from them,please give them a call. Contact information: [place] [place] [address] [place] [address] [phone]        Nicholes Stairs, MD. Schedule an appointment as soon as possible for a visit in 1 week(s).   Specialty:  Orthopedic Surgery Why:  for wound care check Contact information: [address] [place] [address]  [phone]          No Known Allergies  Consultations:   ID and orthopedic surgery   Other Procedures/Studies: Dg Chest 2 View  Result Date: 06/26/2016 CLINICAL DATA:  Cough and congestion. EXAM: CHEST  2 VIEW COMPARISON:  06/23/2016. FINDINGS: Right PICC line noted with tip at cavoatrial junction. Heart size normal. Diffuse bilateral interstitial prominence again noted. Findings suggest interstitial edema and/or interstitial pneumonitis. Bibasilar subsegmental atelectasis. Tiny bilateral pleural effusions. No pneumothorax . IMPRESSION: 1. Right PICC line stable position. 2. Diffuse bilateral from interstitial prominence again noted. Bibasilar subsegmental atelectasis. Findings suggest interstitial edema and/or pneumonitis. Tiny bilateral pleural effusions again noted . Electronically Signed   By: Marcello Moores  Register   On: 06/26/2016 10:33   Dg Chest 2 View  Result Date: 06/23/2016 CLINICAL DATA:  Elevated WBC, cough EXAM: CHEST  2 VIEW COMPARISON:  03/31/2016 FINDINGS: Right-sided PICC line with the tip projecting over the lower SVC. Bilateral trace pleural effusions. Left lower lobe airspace disease. Minimal right lower lobe airspace disease. Bilateral  interstitial thickening. No pneumothorax. Stable cardiomegaly. Moderate osteoarthritis of bilateral glenohumeral joints. IMPRESSION: 1. Right-sided PICC line with the tip projecting over the lower SVC. 2. Bilateral interstitial thickening and bilateral lower lobe airspace disease which may reflect interstitial edema and atelectasis versus multilobar pneumonia. Electronically Signed   By: Kathreen Devoid   On: 06/23/2016 17:50   Dg Chest 2 View  Result Date: 06/01/2016 CLINICAL DATA:  Acute onset of fever and disorientation. Initial encounter. EXAM: CHEST  2 VIEW COMPARISON:  None. FINDINGS: The lungs are well-aerated. Vascular congestion is noted. Minimal bilateral opacities may reflect atelectasis or possibly mild infection. There is no evidence of pleural effusion or pneumothorax. The heart is enlarged. No acute osseous abnormalities are seen. Chronic sclerosis is seen at the humeral heads bilaterally. IMPRESSION: Vascular congestion and cardiomegaly noted. Minimal bilateral opacities may reflect atelectasis or possibly mild infection. Electronically Signed   By: Garald Balding M.D.   On: 06/01/2016 00:03   Dg Knee 1-2 Views Left  Result Date: 06/25/2016 CLINICAL DATA:  Pain and swelling of the left knee. Knee surgery few weeks ago. EXAM: LEFT KNEE - 1-2 VIEW COMPARISON:  06/01/2016 FINDINGS: Tricompartmental osteoarthritis with joint space narrowing and spurring as before without acute fracture nor bone destruction. No intra-articular loose bodies. Suprapatellar surgical drain has been removed since prior comparison. Suprapatellar moderate to large joint effusion. 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 No acute osseous abnormality or bone destruction. Electronically Signed   By: Ashley Royalty M.D.   On: 06/25/2016 23:14   Mr Foot Right W Wo Contrast  Result Date: 06/02/2016 CLINICAL DATA:  Right dorsal foot pain and swelling since Tuesday. No known injury.  EXAM: MRI OF THE RIGHT FOREFOOT WITHOUT AND WITH CONTRAST TECHNIQUE: Multiplanar, multisequence MR imaging of the right foot was performed before and after the administration of intravenous contrast. CONTRAST:  48m MULTIHANCE GADOBENATE DIMEGLUMINE 529 MG/ML IV SOLN COMPARISON:  05/31/2016 radiographs of the right foot FINDINGS: Bones/Joint/Cartilage Mild reactive marrow edema about the second TMT joint with joint space narrowing and subchondral cyst at the base of the second metatarsal are consistent with osteoarthritis. There is marrow edema involving the proximal second metatarsal shaft with faint hypointense linear foci potentially representing a the subtle stress fracture. No findings of acute osteomyelitis. Sub chondral cystic change of the medial and mid cuneiform are noted. Mild hallux valgus. Calcaneal enthesophyte along the plantar aspect. No acute abnormality of  the plantar fascia nor Achilles. Ligaments Nonacute Muscles and Tendons The tendons crossing the ankle joint appear intact. Soft tissues Mild diffuse soft tissue swelling along the plantar dorsal aspect of the foot. 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 Reactive marrow edema about the 2nd TMT joint involving the second metatarsal and middle cuneiform. Further distally within the proximal second metatarsal shaft is marrow edema with minimal faint linear hypointensity seen on some of the images suggesting a possible stress fracture. No findings of acute osteomyelitis. Diffuse mild nonspecific soft tissue edema of the foot, query cellulitis. Electronically Signed   By: Ashley Royalty M.D.   On: 06/02/2016 20:27   Mr Knee Left Wo Contrast  Result Date: 06/26/2016 CLINICAL DATA:  Chronic left knee pain and swelling. EXAM: MRI OF THE LEFT KNEE WITHOUT CONTRAST TECHNIQUE: Multiplanar, multisequence MR imaging of the knee was  performed. No intravenous contrast was administered. COMPARISON:  Radiographs 06/25/2016 FINDINGS: Exam is significantly limited by patient motion. MENISCI Medial meniscus:  Severely degenerated and torn. Lateral meniscus:  Severely degenerated and torn. LIGAMENTS Cruciates: Severe mucoid degeneration of the ACL. The PCL is intact. Collaterals:  Grossly intact.  Mcl and pes anserine bursitis noted. CARTILAGE Patellofemoral:  Moderate degenerative chondrosis/ chondromalacia. Medial: Advanced degenerative chondrosis with full or near full-thickness cartilage loss, joint space narrowing, osteophytic spurring and subchondral edema. Lateral: Advanced degenerative chondrosis with areas of full thickness cartilage loss, joint space narrowing and spurring. Joint:  Large joint effusion and moderate synovitis. Popliteal Fossa:  Small Baker's cyst. Extensor Mechanism: The patella retinacular structures are intact and the quadriceps and patellar tendons are intact. Bones: No acute bony findings. 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 Other: The knee musculature is grossly normal. IMPRESSION: 1. Very limited examination due to patient motion. 2. Significant tricompartmental degenerative changes. 3. Degenerated and torn menisci. 4. Grossly intact ligamentous structures. Mucoid degeneration of the ACL is noted. 5. Large joint effusion and moderate synovitis. Electronically Signed   By: Marijo Sanes M.D.   On: 06/26/2016 17:11   Dg Knee Left Port  Result Date: 06/01/2016 CLINICAL DATA:  Patient status post debridement of the left knee with drain placement. EXAM: PORTABLE LEFT KNEE - 1-2 VIEW COMPARISON:  None. FINDINGS: Drainage catheter projects over the anterior knee. Tricompartmental osteoarthritis. Normal anatomic alignment. No evidence for acute fracture. IMPRESSION: Drain projects over the anterior knee soft tissues. Tricompartmental osteoarthritis. Electronically Signed   By: Lovey Newcomer M.D.   On: 06/01/2016 10:05    Dg Foot Complete Right  Result Date: 06/01/2016 CLINICAL DATA:  Acute onset of right dorsal foot pain and swelling. Initial encounter. EXAM: RIGHT FOOT COMPLETE - 3+ VIEW COMPARISON:  None. FINDINGS: There is no evidence of fracture or dislocation. Degenerative change is noted at the medial aspect of the midfoot. There is no evidence of talar subluxation; the subtalar joint is unremarkable in appearance. A plantar calcaneal spur is seen. No significant soft tissue abnormalities are seen. IMPRESSION: 1. No evidence of fracture or dislocation. 2. Degenerative change at the medial aspect of the midfoot. Electronically Signed   By: Garald Balding M.D.   On: 06/01/2016 00:04   US Abdomen Limited Ruq  Result Date: 06/01/2016 CLINICAL DATA:  Acute onset of fever.  Initial encounter. EXAM: US ABDOMEN LIMITED - RIGHT UPPER QUADRANT COMPARISON:  None. FINDINGS: Gallbladder: Multiple layering stones are seen dependently within the gallbladder, measuring up  to 9 mm in size. No gallbladder wall thickening or pericholecystic fluid is seen. No ultrasonographic Murphy's sign is elicited. Common bile duct: Diameter: 0.4 cm, within normal limits in caliber. Liver: No focal lesion identified. Within normal limits in parenchymal echogenicity. IMPRESSION: Cholelithiasis. Gallbladder otherwise unremarkable. No evidence for cholecystitis or obstruction. Electronically Signed   By: Garald Balding M.D.   On: 06/01/2016 01:19      TODAY-DAY OF DISCHARGE:  Subjective:   [name] today has no headache,no chest abdominal pain,no new weakness tingling or numbness, feels much better wants to go home today.   Objective:   Blood pressure (!) 119/50, pulse 90, temperature 98.5 F (36.9 C), temperature source Oral, resp. rate 19, weight 78 kg (171 lb 15.3 oz), SpO2 99 %.  Intake/Output Summary (Last 24 hours) at 06/29/16 0825 Last data filed at 06/29/16 0000  Gross per 24 hour  Intake              360 ml  Output               400 ml  Net              -40 ml   Filed Weights   06/26/16 1200  Weight: 78 kg (171 lb 15.3 oz)    Exam: Awake Alert, Oriented *3, No new F.N deficits, Normal affect [place].AT,PERRAL Supple Neck,No JVD, No cervical lymphadenopathy appriciated.  Symmetrical Chest wall movement, Good air movement bilaterally, CTAB RRR,No Gallops,Rubs or new Murmurs, No Parasternal Heave +ve B.Sounds, Abd Soft, Non tender, No organomegaly appriciated, No rebound -guarding or rigidity. No Cyanosis, Clubbing or edema, No new Rash or bruise   PERTINENT RADIOLOGIC STUDIES: Dg Chest 2 View  Result Date: 06/26/2016 CLINICAL DATA:  Cough and congestion. EXAM: CHEST  2 VIEW COMPARISON:  06/23/2016. FINDINGS: Right PICC line noted with tip at cavoatrial junction. Heart size normal. Diffuse bilateral interstitial prominence again noted. Findings suggest interstitial edema and/or interstitial pneumonitis. Bibasilar subsegmental atelectasis. Tiny bilateral pleural effusions. No pneumothorax . IMPRESSION: 1. Right PICC line stable position. 2. Diffuse bilateral from interstitial prominence again noted. Bibasilar subsegmental atelectasis. Findings suggest interstitial edema and/or pneumonitis. Tiny bilateral pleural effusions again noted . Electronically Signed   By: Marcello Moores  Register   On: 06/26/2016 10:33   Dg Chest 2 View  Result Date: 06/23/2016 CLINICAL DATA:  Elevated WBC, cough EXAM: CHEST  2 VIEW COMPARISON:  03/31/2016 FINDINGS: Right-sided PICC line with the tip projecting over the lower SVC. Bilateral trace pleural effusions. Left lower lobe airspace disease. Minimal right lower lobe airspace disease. Bilateral interstitial thickening. No pneumothorax. Stable cardiomegaly. Moderate osteoarthritis of bilateral glenohumeral joints. IMPRESSION: 1. Right-sided PICC line with the tip projecting over the lower SVC. 2. Bilateral interstitial thickening and bilateral lower lobe airspace disease which may reflect  interstitial edema and atelectasis versus multilobar pneumonia. Electronically Signed   By: Kathreen Devoid   On: 06/23/2016 17:50   Dg Chest 2 View  Result Date: 06/01/2016 CLINICAL DATA:  Acute onset of fever and disorientation. Initial encounter. EXAM: CHEST  2 VIEW COMPARISON:  None. FINDINGS: The lungs are well-aerated. Vascular congestion is noted. Minimal bilateral opacities may reflect atelectasis or possibly mild infection. There is no evidence of pleural effusion or pneumothorax. The heart is enlarged. No acute osseous abnormalities are seen. Chronic sclerosis is seen at the humeral heads bilaterally. IMPRESSION: Vascular congestion and cardiomegaly noted. Minimal bilateral opacities may reflect atelectasis or possibly mild infection. Electronically Signed   By:  Garald Balding M.D.   On: 06/01/2016 00:03   Dg Knee 1-2 Views Left  Result Date: 06/25/2016 CLINICAL DATA:  Pain and swelling of the left knee. Knee surgery few weeks ago. EXAM: LEFT KNEE - 1-2 VIEW COMPARISON:  06/01/2016 FINDINGS: Tricompartmental osteoarthritis with joint space narrowing and spurring as before without acute fracture nor bone destruction. No intra-articular loose bodies. Suprapatellar surgical drain has been removed since prior comparison. Suprapatellar moderate to large joint effusion. 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 No acute osseous abnormality or bone destruction. Electronically Signed   By: Ashley Royalty M.D.   On: 06/25/2016 23:14   Mr Foot Right W Wo Contrast  Result Date: 06/02/2016 CLINICAL DATA:  Right dorsal foot pain and swelling since Tuesday. No known injury. EXAM: MRI OF THE RIGHT FOREFOOT WITHOUT AND WITH CONTRAST TECHNIQUE: Multiplanar, multisequence MR imaging of the right foot was performed before and after the administration of intravenous contrast. CONTRAST:  86m MULTIHANCE GADOBENATE DIMEGLUMINE 529 MG/ML IV SOLN COMPARISON:  05/31/2016 radiographs  of the right foot FINDINGS: Bones/Joint/Cartilage Mild reactive marrow edema about the second TMT joint with joint space narrowing and subchondral cyst at the base of the second metatarsal are consistent with osteoarthritis. There is marrow edema involving the proximal second metatarsal shaft with faint hypointense linear foci potentially representing a the subtle stress fracture. No findings of acute osteomyelitis. Sub chondral cystic change of the medial and mid cuneiform are noted. Mild hallux valgus. Calcaneal enthesophyte along the plantar aspect. No acute abnormality of the plantar fascia nor Achilles. Ligaments Nonacute Muscles and Tendons The tendons crossing the ankle joint appear intact. Soft tissues Mild diffuse soft tissue swelling along the plantar dorsal aspect of the foot. 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 Reactive marrow edema about the 2nd TMT joint involving the second metatarsal and middle cuneiform. Further distally within the proximal second metatarsal shaft is marrow edema with minimal faint linear hypointensity seen on some of the images suggesting a possible stress fracture. No findings of acute osteomyelitis. Diffuse mild nonspecific soft tissue edema of the foot, query cellulitis. Electronically Signed   By: DAshley RoyaltyM.D.   On: 06/02/2016 20:27   Mr Knee Left Wo Contrast  Result Date: 06/26/2016 CLINICAL DATA:  Chronic left knee pain and swelling. EXAM: MRI OF THE LEFT KNEE WITHOUT CONTRAST TECHNIQUE: Multiplanar, multisequence MR imaging of the knee was performed. No intravenous contrast was administered. COMPARISON:  Radiographs 06/25/2016 FINDINGS: Exam is significantly limited by patient motion. MENISCI Medial meniscus:  Severely degenerated and torn. Lateral meniscus:  Severely degenerated and torn. LIGAMENTS Cruciates: Severe mucoid degeneration  of the ACL. The PCL is intact. Collaterals:  Grossly intact.  Mcl and pes anserine bursitis noted. CARTILAGE Patellofemoral:  Moderate degenerative chondrosis/ chondromalacia. Medial: Advanced degenerative chondrosis with full or near full-thickness cartilage loss, joint space narrowing, osteophytic spurring and subchondral edema. Lateral: Advanced degenerative chondrosis with areas of full thickness cartilage loss, joint space narrowing and spurring. Joint:  Large joint effusion and moderate synovitis. Popliteal Fossa:  Small Baker's cyst. Extensor Mechanism: The patella retinacular structures are intact and the quadriceps and patellar tendons are intact. Bones: No acute bony findings. 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 Other: The knee musculature is grossly normal. IMPRESSION: 1. Very limited examination due to patient motion. 2. Significant tricompartmental degenerative changes. 3. Degenerated and torn menisci. 4. Grossly intact  ligamentous structures. Mucoid degeneration of the ACL is noted. 5. Large joint effusion and moderate synovitis. Electronically Signed   By: Marijo Sanes M.D.   On: 06/26/2016 17:11   Dg Knee Left Port  Result Date: 06/01/2016 CLINICAL DATA:  Patient status post debridement of the left knee with drain placement. EXAM: PORTABLE LEFT KNEE - 1-2 VIEW COMPARISON:  None. FINDINGS: Drainage catheter projects over the anterior knee. Tricompartmental osteoarthritis. Normal anatomic alignment. No evidence for acute fracture. IMPRESSION: Drain projects over the anterior knee soft tissues. Tricompartmental osteoarthritis. Electronically Signed   By: Lovey Newcomer M.D.   On: 06/01/2016 10:05   Dg Foot Complete Right  Result Date: 06/01/2016 CLINICAL DATA:  Acute onset of right dorsal foot pain and swelling. Initial encounter. EXAM: RIGHT FOOT COMPLETE - 3+ VIEW COMPARISON:  None. FINDINGS: There is no evidence of fracture or dislocation. Degenerative change is noted at the medial aspect of  the midfoot. There is no evidence of talar subluxation; the subtalar joint is unremarkable in appearance. A plantar calcaneal spur is seen. No significant soft tissue abnormalities are seen. IMPRESSION: 1. No evidence of fracture or dislocation. 2. Degenerative change at the medial aspect of the midfoot. Electronically Signed   By: Garald Balding M.D.   On: 06/01/2016 00:04   US Abdomen Limited Ruq  Result Date: 06/01/2016 CLINICAL DATA:  Acute onset of fever.  Initial encounter. EXAM: US ABDOMEN LIMITED - RIGHT UPPER QUADRANT COMPARISON:  None. FINDINGS: Gallbladder: Multiple layering stones are seen dependently within the gallbladder, measuring up to 9 mm in size. No gallbladder wall thickening or pericholecystic fluid is seen. No ultrasonographic Murphy's sign is elicited. Common bile duct: Diameter: 0.4 cm, within normal limits in caliber. Liver: No focal lesion identified. Within normal limits in parenchymal echogenicity. IMPRESSION: Cholelithiasis. Gallbladder otherwise unremarkable. No evidence for cholecystitis or obstruction. Electronically Signed   By: Garald Balding M.D.   On: 06/01/2016 01:19     PERTINENT LAB RESULTS: CBC:  Recent Labs  06/28/16 0519 06/29/16 0416  WBC 16.4* 12.4*  HGB 8.7* 7.5*  HCT 27.5* 23.8*  PLT 503* 508*   CMET CMP     Component Value Date/Time   NA 137 06/28/2016 0519   K 3.9 06/28/2016 0519   CL 105 06/28/2016 0519   CO2 25 06/28/2016 0519   GLUCOSE 105 (H) 06/28/2016 0519   BUN 5 (L) 06/28/2016 0519   CREATININE 0.66 06/28/2016 0519   CALCIUM 9.1 06/28/2016 0519   PROT 7.1 06/24/2016 0647   ALBUMIN 1.8 (L) 06/24/2016 0647   AST 38 06/24/2016 0647   ALT 50 06/24/2016 0647   ALKPHOS 360 (H) 06/24/2016 0647   BILITOT 0.8 06/24/2016 0647   GFRNONAA >60 06/28/2016 0519   GFRAA >60 06/28/2016 0519    GFR Estimated Creatinine Clearance: 77.9 mL/min (by C-G formula based on SCr of 0.66 mg/dL). No results for input(s): LIPASE, AMYLASE in the  last 72 hours. No results for input(s): CKTOTAL, CKMB, CKMBINDEX, TROPONINI in the last 72 hours. Invalid input(s): POCBNP No results for input(s): DDIMER in the last 72 hours. No results for input(s): HGBA1C in the last 72 hours. No results for input(s): CHOL, HDL, LDLCALC, TRIG, CHOLHDL, LDLDIRECT in the last 72 hours. No results for input(s): TSH, T4TOTAL, T3FREE, THYROIDAB in the last 72 hours.  Invalid input(s): FREET3 No results for input(s): VITAMINB12, FOLATE, FERRITIN, TIBC, IRON, RETICCTPCT in the last 72 hours. Coags: No results for input(s): INR in the last 72 hours.  Invalid input(s): PT Microbiology: Recent Results (from the past 240 hour(s))  Blood Culture (routine x 2)     Status: None   Collection Time: 06/23/16  6:15 PM  Result Value Ref Range Status   Specimen Description BLOOD LEFT WRIST  Final   Special Requests IN PEDIATRIC BOTTLE 3CC  Final   Culture NO GROWTH 5 DAYS  Final   Report Status 06/28/2016 FINAL  Final  Blood Culture (routine x 2)     Status: None   Collection Time: 06/23/16  6:28 PM  Result Value Ref Range Status   Specimen Description BLOOD LEFT ARM  Final   Special Requests BOTTLES DRAWN AEROBIC AND ANAEROBIC 5CC  Final   Culture NO GROWTH 5 DAYS  Final   Report Status 06/28/2016 FINAL  Final  Urine culture     Status: Abnormal   Collection Time: 06/24/16  1:10 AM  Result Value Ref Range Status   Specimen Description URINE, RANDOM  Final   Special Requests NONE  Final   Culture <10,000 COLONIES/mL INSIGNIFICANT GROWTH (A)  Final   Report Status 06/25/2016 FINAL  Final  Respiratory Panel by PCR     Status: None   Collection Time: 06/24/16  3:41 PM  Result Value Ref Range Status   Adenovirus NOT DETECTED NOT DETECTED Final   Coronavirus 229E NOT DETECTED NOT DETECTED Final   Coronavirus HKU1 NOT DETECTED NOT DETECTED Final   Coronavirus NL63 NOT DETECTED NOT DETECTED Final   Coronavirus OC43 NOT DETECTED NOT DETECTED Final    Metapneumovirus NOT DETECTED NOT DETECTED Final   Rhinovirus / Enterovirus NOT DETECTED NOT DETECTED Final   Influenza A NOT DETECTED NOT DETECTED Final   Influenza B NOT DETECTED NOT DETECTED Final   Parainfluenza Virus 1 NOT DETECTED NOT DETECTED Final   Parainfluenza Virus 2 NOT DETECTED NOT DETECTED Final   Parainfluenza Virus 3 NOT DETECTED NOT DETECTED Final   Parainfluenza Virus 4 NOT DETECTED NOT DETECTED Final   Respiratory Syncytial Virus NOT DETECTED NOT DETECTED Final   Bordetella pertussis NOT DETECTED NOT DETECTED Final   Chlamydophila pneumoniae NOT DETECTED NOT DETECTED Final   Mycoplasma pneumoniae NOT DETECTED NOT DETECTED Final  Surgical pcr screen     Status: None   Collection Time: 06/27/16 11:03 AM  Result Value Ref Range Status   MRSA, PCR NEGATIVE NEGATIVE Final   Staphylococcus aureus NEGATIVE NEGATIVE Final    Comment:        The Xpert SA Assay (FDA approved for NASAL specimens in patients over 55 years of age), is one component of a comprehensive surveillance program.  Test performance has been validated by [facility] for patients greater than or equal to 71 year old. It is not intended to diagnose infection nor to guide or monitor treatment.   Aerobic/Anaerobic Culture (surgical/deep wound)     Status: None (Preliminary result)   Collection Time: 06/27/16  5:46 PM  Result Value Ref Range Status   Specimen Description SYNOVIAL KNEE FLUID  Final   Special Requests SWABS  Final   Gram Stain   Final    ABUNDANT WBC PRESENT, PREDOMINANTLY PMN NO ORGANISMS SEEN    Culture NO GROWTH < 24 HOURS  Final   Report Status PENDING  Incomplete    FURTHER DISCHARGE INSTRUCTIONS:  Get Medicines reviewed and adjusted: Please take all your medications with you for your next visit with your Primary MD  Laboratory/radiological data: Please request your Primary MD to go over all hospital  tests and procedure/radiological results at the follow up, please ask your  Primary MD to get all Hospital records sent to his/her office.  In some cases, they will be blood work, cultures and biopsy results pending at the time of your discharge. Please request that your primary care M.D. goes through all the records of your hospital data and follows up on these results.  Also Note the following: If you experience worsening of your admission symptoms, develop shortness of breath, life threatening emergency, suicidal or homicidal thoughts you must seek medical attention immediately by calling 911 or calling your MD immediately  if symptoms less severe.  You must read complete instructions/literature along with all the possible adverse reactions/side effects for all the Medicines you take and that have been prescribed to you. Take any new Medicines after you have completely understood and accpet all the possible adverse reactions/side effects.   Do not drive when taking Pain medications or sleeping medications (Benzodaizepines)  Do not take more than prescribed Pain, Sleep and Anxiety Medications. It is not advisable to combine anxiety,sleep and pain medications without talking with your primary care practitioner  Special Instructions: If you have smoked or chewed Tobacco  in the last 2 yrs please stop smoking, stop any regular Alcohol  and or any Recreational drug use.  Wear Seat belts while driving.  Please note: You were cared for by a hospitalist during your hospital stay. Once you are discharged, your primary care physician will handle any further medical issues. Please note that NO REFILLS for any discharge medications will be authorized once you are discharged, as it is imperative that you return to your primary care physician (or establish a relationship with a primary care physician if you do not have one) for your post hospital discharge needs so that they can reassess your need for medications and monitor your lab values.  Total Time spent coordinating discharge  including counseling, education and face to face time equals 45 minutes.  SignedOren Binet 06/29/2016 8:25 AM

## 2016-06-30 ENCOUNTER — Inpatient Hospital Stay: Payer: BLUE CROSS/BLUE SHIELD | Admitting: Internal Medicine

## 2016-07-02 LAB — AEROBIC/ANAEROBIC CULTURE (SURGICAL/DEEP WOUND): CULTURE: NO GROWTH

## 2016-07-02 LAB — AEROBIC/ANAEROBIC CULTURE W GRAM STAIN (SURGICAL/DEEP WOUND)

## 2016-07-03 ENCOUNTER — Encounter: Payer: Self-pay | Admitting: Internal Medicine

## 2016-07-07 ENCOUNTER — Ambulatory Visit (INDEPENDENT_AMBULATORY_CARE_PROVIDER_SITE_OTHER): Payer: BLUE CROSS/BLUE SHIELD | Admitting: Internal Medicine

## 2016-07-07 ENCOUNTER — Telehealth: Payer: Self-pay | Admitting: *Deleted

## 2016-07-07 ENCOUNTER — Encounter: Payer: Self-pay | Admitting: Internal Medicine

## 2016-07-07 VITALS — BP 160/78 | HR 98 | Temp 98.2°F | Ht 67.0 in | Wt 155.0 lb

## 2016-07-07 DIAGNOSIS — R634 Abnormal weight loss: Secondary | ICD-10-CM

## 2016-07-07 DIAGNOSIS — M00261 Other streptococcal arthritis, right knee: Secondary | ICD-10-CM | POA: Diagnosis not present

## 2016-07-07 DIAGNOSIS — D571 Sickle-cell disease without crisis: Secondary | ICD-10-CM | POA: Diagnosis not present

## 2016-07-07 NOTE — Telephone Encounter (Signed)
Verbal order per Dr Baxter Flattery given to [facility] at [place] for labs to be drawn 2/5 including ESR, CRP and to continue IV Vancomycin until 2/8, ok to pull PICC after treatment.  Verbal order repeated and verified. Landis Gandy, RN

## 2016-07-07 NOTE — Progress Notes (Signed)
Rfv: hospital follow up for septic arthritis of right knee  Patient ID: Angie Wright, female   DOB: 09/04/1955, 61 y.o.   MRN: [id number]  HPI Angie Wright is a 61yo F with sickle cell trait who was admitted in late December for a right knee septic arthritis with  streptococcus pneumonaie, s/p washout on 1/28 who was discharged on ceftriaxone 2gm Iv daily to end 4 wk course on 1/26. She was doing well until 1/18 where her WBC had climbed from 15 up to 30K though she did not complain of worsening right knee pain. She was readmitted for evaluation of leukocytosis. She was started on vancomycin for possible pneumonia on CXR though she did not have any pulmonary symptoms. Her effusion to her right knee was still significant and was re-evaluated by orthopedics who took her back to surgery on 1/23 for arthroscopic I x D with extensive synovectomy since there was still extensive inflammation/synovitis to anteromedial nad anterolateral chambers. By her discharge her WBC had returned close to WNL at 12K. She was discharged on vancomycin to complete on feb 5th.  She returns to clinic for follow to see how she has recovered from her recent surgery. She has seen dr. Stann Mainland, from orthopedics who was pleased with her recovery. She denies any further thrush, which was treated in the hospital. No problems with picc line. She uses walker for assistance of ambulation and continues with pt for right knee  Outpatient Encounter Prescriptions as of 07/07/2016  Medication Sig  . acetaminophen (TYLENOL) 500 MG tablet Take 1,000 mg by mouth every 6 (six) hours as needed for headache (pain).  Marland Kitchen aspirin EC 81 MG tablet Take 1 tablet (81 mg total) by mouth daily.  . calcium carbonate (OS-CAL - DOSED IN MG OF ELEMENTAL CALCIUM) 1250 (500 Ca) MG tablet Take 1 tablet by mouth daily with breakfast.  . cholecalciferol (VITAMIN D) 1000 units tablet Take 1,000 Units by mouth daily.  . ferrous sulfate 325 (65 FE) MG tablet Take 1 tablet  (325 mg total) by mouth 2 (two) times daily with a meal.  . HYDROcodone-acetaminophen (NORCO) 7.5-325 MG tablet Take 1 tablet by mouth every 6 (six) hours as needed for moderate pain.  . Multiple Vitamin (MULTIVITAMIN WITH MINERALS) TABS tablet Take 1 tablet by mouth daily.  . polyethylene glycol (MIRALAX / GLYCOLAX) packet Take 17 g by mouth daily as needed for moderate constipation. (Patient taking differently: Take 17 g by mouth daily as needed for moderate constipation. Mix in 8 oz fluid and drink)  . potassium chloride SA (K-DUR,KLOR-CON) 20 MEQ tablet Take 1 tablet (20 mEq total) by mouth daily.  . vancomycin IVPB Inject 750 mg into the vein every 12 (twelve) hours. Indication:  septic arthritis Last Day of Therapy:  07/10/16 Labs - Sunday/Monday:  CBC/D, BMP, and vancomycin trough. Labs - Thursday:  BMP and vancomycin trough Labs - Every other week:  ESR and CRP   No facility-administered encounter medications on file as of 07/07/2016.      Patient Active Problem List   Diagnosis Date Noted  . Generalized weakness   . Pneumonia 06/24/2016  . Leukocytosis 06/23/2016  . Thrush 06/07/2016  . Streptococcal arthritis of left knee ([place])   . Fever in adult   . Sepsis ([place])   . Right foot pain   . Septic arthritis of knee, left ([place]) 06/01/2016  . Sepsis, Gram positive ([place]) 06/01/2016  . Anemia 06/01/2016  . Sickle cell trait ([place]) 06/01/2016  Health Maintenance Due  Topic Date Due  . TETANUS/TDAP  02/21/1974  . PAP SMEAR  02/22/1976  . MAMMOGRAM  02/21/2005  . COLONOSCOPY  02/21/2005  . ZOSTAVAX  02/22/2015     Review of Systems 10 point ros is negative, except for 20# weight loss since December hospitalization. Physical Exam   BP (!) 160/78   Pulse 98   Temp 98.2 F (36.8 C) (Oral)   Ht 5' 7" (1.702 m)   Wt 155 lb (70.3 kg)   BMI 24.28 kg/m   Physical Exam  Constitutional:  oriented to person, place, and time. appears well-developed and well-nourished. No  distress.  HENT: [place]/AT, PERRLA, no scleral icterus Mouth/Throat: Oropharynx is clear and moist. No oropharyngeal exudate.  Cardiovascular: Normal rate, regular rhythm and normal heart sounds. Exam reveals no gallop and no friction rub.  No murmur heard.  Pulmonary/Chest: Effort normal and breath sounds normal. No respiratory distress.  has no wheezes.  Neck = supple, no nuchal rigidity Ext: right knee swelling slight warmth, trace pitting edema Skin: Skin is warm and dry. No rash noted. No erythema.  Psychiatric: a normal mood and affect.  behavior is normal.   CBC Lab Results  Component Value Date   WBC 12.4 (H) 06/29/2016   RBC 2.97 (L) 06/29/2016   HGB 7.5 (L) 06/29/2016   HCT 23.8 (L) 06/29/2016   PLT 508 (H) 06/29/2016   MCV 80.1 06/29/2016   MCH 25.3 (L) 06/29/2016   MCHC 31.5 06/29/2016   RDW 23.6 (H) 06/29/2016   LYMPHSABS 0.7 06/26/2016   MONOABS 0.5 06/26/2016   EOSABS 0.5 06/26/2016    BMET Lab Results  Component Value Date   NA 137 06/28/2016   K 3.9 06/28/2016   CL 105 06/28/2016   CO2 25 06/28/2016   GLUCOSE 105 (H) 06/28/2016   BUN 5 (L) 06/28/2016   CREATININE 0.66 06/28/2016   CALCIUM 9.1 06/28/2016   GFRNONAA >60 06/28/2016   GFRAA >60 06/28/2016      Assessment and Plan Septic arthritis of right knee = will treat for a total of 6 wk. She needed a wash out on 1/23 for extensive synovitis but cultures remained negative.   Advance to get sed rate and crp. Will do end date on feb 8th a total of 6 wk of abtx  Then if markers still elevated, will give keflex x 2-4 wk  Anemia = has hx of sickle cell trait. At her baseline  Weight loss = could be due to abtx impact on taste and appetite. We will reassess at next appt. Encourage to try protein shake supplements like ensure

## 2016-07-14 ENCOUNTER — Other Ambulatory Visit: Payer: Self-pay | Admitting: Pharmacist

## 2016-07-14 DIAGNOSIS — M00262 Other streptococcal arthritis, left knee: Secondary | ICD-10-CM

## 2016-07-14 MED ORDER — CEPHALEXIN 500 MG PO CAPS: 500.0000 mg | ORAL_CAPSULE | Freq: Four times a day (QID) | ORAL | 0 refills | Status: DC

## 2016-07-18 ENCOUNTER — Encounter: Payer: Self-pay | Admitting: Oncology

## 2016-07-18 ENCOUNTER — Telehealth: Payer: Self-pay | Admitting: Oncology

## 2016-07-18 NOTE — Telephone Encounter (Signed)
Appt scheduled w/the pt's daughter. Per her request appt was scheduled for her mother to see Dr. Alen Blew on 3/2 at [place] to arrive 30 minutes early. Demographics verified. Letter mailed.

## 2016-08-03 ENCOUNTER — Telehealth: Payer: Self-pay | Admitting: *Deleted

## 2016-08-03 NOTE — Telephone Encounter (Signed)
Called patient and reminded her of her appointment with Dr. Alen Blew tomorrow. Patient verbalized understanding.

## 2016-08-04 ENCOUNTER — Telehealth: Payer: Self-pay | Admitting: Oncology

## 2016-08-04 ENCOUNTER — Ambulatory Visit (HOSPITAL_BASED_OUTPATIENT_CLINIC_OR_DEPARTMENT_OTHER): Payer: BLUE CROSS/BLUE SHIELD | Admitting: Oncology

## 2016-08-04 VITALS — BP 138/52 | HR 108 | Temp 98.3°F | Resp 18 | Ht 67.0 in | Wt 141.4 lb

## 2016-08-04 DIAGNOSIS — E611 Iron deficiency: Secondary | ICD-10-CM | POA: Diagnosis not present

## 2016-08-04 DIAGNOSIS — D649 Anemia, unspecified: Secondary | ICD-10-CM

## 2016-08-04 DIAGNOSIS — M009 Pyogenic arthritis, unspecified: Secondary | ICD-10-CM

## 2016-08-04 DIAGNOSIS — D573 Sickle-cell trait: Secondary | ICD-10-CM

## 2016-08-04 NOTE — Progress Notes (Signed)
Reason for Referral: Anemia.   HPI: 61 year old woman currently of [place] where she has been living the majority of her life. She has a history of sickle cell trait although it is unclear what her baseline hemoglobin is. She had a CBC in 2015 and her hemoglobin was 7.5 around that time. He started developing recurrent left knee fusion that has been drained periodically. She developed complications of septic arthritis in December 2017. He underwent surgical washout of her left knee in December 2017. At that time her hemoglobin was as low as 6.2 with an MCV of 68. She was hospitalized in January 2019 after presenting with septic arthritis related to strep pneumo. During her hospitalization she underwent I&D on 06/27/2016. During that time, her hemoglobin was 6.2 with a white cell count of 21.6. Her MCV was 74. Iron studies previously noted the severe iron deficiency with iron level of 8 in December 2017 and was 19 on January 2018. Her ferritin was elevated at 976. She did receive intravenous iron during her hospitalization as well as transfusion of both red cells. On 06/29/2016 her hemoglobin was 7.5 with an MCV of 80. Follow-up with her primary care physician on 07/12/2016 showed it to her hemoglobin was 9.1 white cell count was 13.5 with a platelet count of 741. Her MCV 77.5. Her sedimentation rate is still elevated 140 with ferritin level of 1389.  Clinically, she reports feeling much better since her recent hospitalization. She is no longer reporting any fevers or chills or sweats. Her performance status and activity levels are improving slowly with physical therapy. She did require intravenous antibiotics initially and subsequently taking Keflex. She denies any pathological fractures or bone pain. Her appetite has been down off lost weight but slightly improving.  She is not report any headaches, blurry vision, syncope or seizures. She does not report any fevers, chills or sweats. She does not report  any cough, wheezing or hemoptysis. She does not report any nausea, vomiting or abdominal pain. She does not report any frequency urgency or hesitancy. She does not report any skeletal complaints. Remaining review of systems unremarkable.   Past Medical History:  Diagnosis Date  . Anemia    states "sickle cell trait, HGB always runs between 7 and 8"  :  Past Surgical History:  Procedure Laterality Date  . KNEE ARTHROSCOPY Left 06/01/2016   Procedure: ARTHROSCOPIC WASHOUT LEFT KNEE;  Surgeon: Melina Schools, MD;  Location: [facility];  Service: Orthopedics;  Laterality: Left;  . TOTAL ANKLE ARTHROPLASTY Left 06/27/2016   Procedure: ARTHROSCOPY KNEE/IRRIGATION AND DEBRIDEMENT;  Surgeon: Nicholes Stairs, MD;  Location: [facility];  Service: Orthopedics;  Laterality: Left;  :   Current Outpatient Prescriptions:  .  acetaminophen (TYLENOL) 500 MG tablet, Take 1,000 mg by mouth every 6 (six) hours as needed for headache (pain)., Disp: , Rfl:  .  calcium carbonate (OS-CAL - DOSED IN MG OF ELEMENTAL CALCIUM) 1250 (500 Ca) MG tablet, Take 1 tablet by mouth daily with breakfast., Disp: , Rfl:  .  cephALEXin (KEFLEX) 500 MG capsule, Take 1 capsule (500 mg total) by mouth 4 (four) times daily., Disp: 120 capsule, Rfl: 0 .  cholecalciferol (VITAMIN D) 1000 units tablet, Take 1,000 Units by mouth daily., Disp: , Rfl:  .  Probiotic Product (PROBIOTIC DAILY PO), Take 1 tablet by mouth daily., Disp: , Rfl: :  No Known Allergies:  Family History  Problem Relation Age of Onset  . Sickle cell anemia Brother     2  brothers passed with sickle cell anemia  :  Social History   Social History  . Marital status: Married    Spouse name: N/A  . Number of children: N/A  . Years of education: N/A   Occupational History  . Not on file.   Social History Main Topics  . Smoking status: Never Smoker  . Smokeless tobacco: Never Used  . Alcohol use No  . Drug use: No  . Sexual activity: Not on file   Other  Topics Concern  . Not on file   Social History Narrative  . No narrative on file  :  Pertinent items are noted in HPI.  Exam: Blood pressure (!) 138/52, pulse (!) 108, temperature 98.3 F (36.8 C), temperature source Oral, resp. rate 18, height 5\' 7"  (1.702 m), weight 141 lb 6.4 oz (64.1 kg), SpO2 100 %.  ECOG 1 General appearance: alert and cooperative. Without distress. Head: Normocephalic, without obvious abnormality Throat: lips, mucosa, and tongue normal; teeth and gums normal Neck: no adenopathy Back: negative Resp: clear to auscultation bilaterally Chest wall: no tenderness Cardio: regular rate and rhythm, S1, S2 normal, no murmur, click, rub or gallop GI: soft, non-tender; bowel sounds normal; no masses,  no organomegaly Extremities: extremities normal, atraumatic. Left knee swelling noted. No erythema or tenderness. Pulses: 2+ and symmetric Lymph nodes: Cervical, supraclavicular, and axillary nodes normal.  Labs reported in the history of present illness.   Assessment and Plan:   61 year old woman with the following issues:  1. Multifactorial anemia: She has a history of sickle cell trait although her baseline hemoglobin has been lower than expected. Her hemoglobin was around 7 in 2015. She did develop an element of iron deficiency as well as anemia of chronic disease. Her iron levels were low with increased TIBC and increased ferritin or indicate chronic disease and anemia. This is in the setting of her acute septic arthritis that required surgical intervention and intravenous antibiotics.  Her most recent hemoglobin is close to normal and does not require any intervention at this time. I have recommended repeating her CBC and iron studies in 3 months and replace as needed. Growth factor support might be considered if she develops symptomatic anemia in the future.  Other considerations that could be contributing to her anemia such as plasma cell disorder, myelodysplasia,  lymphoproliferative disorder are considered unlikely. Occult malignancy would be unlikely at this time. She had multiple imaging studies including chest x-ray and she is due for a repeat colonoscopy.  2. Septic arthritis: Appears to be clinically improving.  3. Elevated ferritin: This is related to chronic inflammation related to her recent septic arthritis. She also has elevated sedimentation rate and C-reactive protein was support the diagnosis. I do not see any evidence to suggest evidence of bone marrow disorder such as ineffective erythropoiesis myeloproliferative disorder. These will be a consideration if these findings persist.  4. Follow-up: Will be in 3 months to repeat laboratory testing and follow on her clinical status.

## 2016-08-04 NOTE — Telephone Encounter (Signed)
Appointments scheduled per 3/2 LOS. Patient given AVS report and calendars with future scheduled appointments. Patient requested to have 6/12 per am appointment times available.

## 2016-08-08 ENCOUNTER — Encounter: Payer: Self-pay | Admitting: Internal Medicine

## 2016-08-08 ENCOUNTER — Ambulatory Visit (INDEPENDENT_AMBULATORY_CARE_PROVIDER_SITE_OTHER): Payer: BLUE CROSS/BLUE SHIELD | Admitting: Internal Medicine

## 2016-08-08 VITALS — BP 133/67 | HR 109 | Temp 98.3°F | Ht 66.0 in | Wt 140.0 lb

## 2016-08-08 DIAGNOSIS — M00262 Other streptococcal arthritis, left knee: Secondary | ICD-10-CM | POA: Diagnosis not present

## 2016-08-08 DIAGNOSIS — R634 Abnormal weight loss: Secondary | ICD-10-CM

## 2016-08-08 DIAGNOSIS — D72823 Leukemoid reaction: Secondary | ICD-10-CM

## 2016-08-08 DIAGNOSIS — M0020 Other streptococcal arthritis, unspecified joint: Secondary | ICD-10-CM | POA: Diagnosis not present

## 2016-08-08 DIAGNOSIS — R630 Anorexia: Secondary | ICD-10-CM

## 2016-08-08 LAB — CBC WITH DIFFERENTIAL/PLATELET
BASOS PCT: 0 %
Basophils Absolute: 0 cells/uL (ref 0–200)
Eosinophils Absolute: 234 cells/uL (ref 15–500)
Eosinophils Relative: 1 %
HEMATOCRIT: 26.1 % — AB (ref 35.0–45.0)
Hemoglobin: 8.4 g/dL — ABNORMAL LOW (ref 11.7–15.5)
LYMPHS PCT: 14 %
Lymphs Abs: 3276 cells/uL (ref 850–3900)
MCH: 24.6 pg — ABNORMAL LOW (ref 27.0–33.0)
MCHC: 32.2 g/dL (ref 32.0–36.0)
MCV: 76.5 fL — ABNORMAL LOW (ref 80.0–100.0)
MONO ABS: 936 {cells}/uL (ref 200–950)
MONOS PCT: 4 %
MPV: 8.9 fL (ref 7.5–12.5)
Neutro Abs: 18954 cells/uL — ABNORMAL HIGH (ref 1500–7800)
Neutrophils Relative %: 81 %
PLATELETS: 700 10*3/uL — AB (ref 140–400)
RBC: 3.41 MIL/uL — AB (ref 3.80–5.10)
RDW: 20.4 % — ABNORMAL HIGH (ref 11.0–15.0)
WBC: 23.4 10*3/uL — AB (ref 3.8–10.8)

## 2016-08-08 NOTE — Progress Notes (Signed)
Patient ID: Angie Wright, female   DOB: 09-Mar-1956, 61 y.o.   MRN: [id number]  HPI Angie Wright is a 61yo F with sickle cell trait who was hospitalized for streptococcal arthritis of right knee. She had been on cefazolin then transitioned to oral abtx with cephalexin. she reports #15 loss in 1 month. Mainly due to loss of appetite  Outpatient Encounter Prescriptions as of 08/08/2016  Medication Sig  . acetaminophen (TYLENOL) 500 MG tablet Take 1,000 mg by mouth every 6 (six) hours as needed for headache (pain).  . calcium carbonate (OS-CAL - DOSED IN MG OF ELEMENTAL CALCIUM) 1250 (500 Ca) MG tablet Take 1 tablet by mouth daily with breakfast.  . cephALEXin (KEFLEX) 500 MG capsule Take 1 capsule (500 mg total) by mouth 4 (four) times daily.  . cholecalciferol (VITAMIN D) 1000 units tablet Take 1,000 Units by mouth daily.  . Probiotic Product (PROBIOTIC DAILY PO) Take 1 tablet by mouth daily.   No facility-administered encounter medications on file as of 08/08/2016.      Patient Active Problem List   Diagnosis Date Noted  . Generalized weakness   . Pneumonia 06/24/2016  . Leukocytosis 06/23/2016  . Thrush 06/07/2016  . Streptococcal arthritis of left knee ([place])   . Fever in adult   . Sepsis ([place])   . Right foot pain   . Septic arthritis of knee, left ([place]) 06/01/2016  . Sepsis, Gram positive ([place]) 06/01/2016  . Anemia 06/01/2016  . Sickle cell trait ([place]) 06/01/2016     Health Maintenance Due  Topic Date Due  . TETANUS/TDAP  02/21/1974  . PAP SMEAR  02/22/1976  . MAMMOGRAM  02/21/2005  . COLONOSCOPY  02/21/2005     Review of Systems +weight loss, loss of appetite. No diarrhea. No rash. No yeast infection. 10 point ros is otherwise negative Physical Exam   BP 133/67   Pulse (!) 109   Temp 98.3 F (36.8 C) (Oral)   Ht 5\' 6"  (1.676 m)   Wt 140 lb (63.5 kg)   BMI 22.60 kg/m   Physical Exam  Constitutional:  oriented to person, place, and time. appears well-developed and  well-nourished. No distress.  HENT: [place]/AT, PERRLA, no scleral icterus Mouth/Throat: Oropharynx is clear and moist. No oropharyngeal exudate.  Cardiovascular: Normal rate, regular rhythm and normal heart sounds. Exam reveals no gallop and no friction rub.  No murmur heard.  Pulmonary/Chest: Effort normal and breath sounds normal. No respiratory distress.  has no wheezes.  Neck = supple, no nuchal rigidity Ext = left knee is not warm just slightly swollen Lymphadenopathy: no cervical adenopathy. No axillary adenopathy Neurological: alert and oriented to person, place, and time.  Skin: Skin is warm and dry. No rash noted. No erythema.  Psychiatric: a normal mood and affect.  behavior is normal.   CBC Lab Results  Component Value Date   WBC 12.4 (H) 06/29/2016   RBC 2.97 (L) 06/29/2016   HGB 7.5 (L) 06/29/2016   HCT 23.8 (L) 06/29/2016   PLT 508 (H) 06/29/2016   MCV 80.1 06/29/2016   MCH 25.3 (L) 06/29/2016   MCHC 31.5 06/29/2016   RDW 23.6 (H) 06/29/2016   LYMPHSABS 0.7 06/26/2016   MONOABS 0.5 06/26/2016   EOSABS 0.5 06/26/2016    BMET Lab Results  Component Value Date   NA 137 06/28/2016   K 3.9 06/28/2016   CL 105 06/28/2016   CO2 25 06/28/2016   GLUCOSE 105 (H) 06/28/2016   BUN 5 (  L) 06/28/2016   CREATININE 0.66 06/28/2016   CALCIUM 9.1 06/28/2016   GFRNONAA >60 06/28/2016   GFRAA >60 06/28/2016    Lab Results  Component Value Date   ESRSEDRATE 34 (H) 08/08/2016   Lab Results  Component Value Date   CRP 52.1 (H) 08/08/2016     Assessment and Plan  Septic arhritis = will check sed rate and crp and cbc to determine to stop abtx  Addendum : Her inflammatory markers are still elevated, though much improved since at the end of the year . Plan to continue for additional 4 wk of oral abtx  Leukocytosis = she had marked elevation on labs though does not subscribe to any secondary systemic symptoms as fever or chills. This was similar on her presentation initially  as well as when we had asked her to go back to hospital in late December for wash out of knee. Will ask her to follow up with PCP and possibly hematology.   Appetite suppression/weight loss = thought to be due to abtx, though it is very signifficant to have lost so much weight in 4 weeks time, Recommend to increase nutritional intake protein drinks

## 2016-08-09 LAB — C-REACTIVE PROTEIN: CRP: 52.1 mg/L — AB (ref <8.0)

## 2016-08-09 LAB — SEDIMENTATION RATE: SED RATE: 34 mm/h — AB (ref 0–30)

## 2016-08-14 MED ORDER — CEPHALEXIN 500 MG PO CAPS: 500.0000 mg | ORAL_CAPSULE | Freq: Four times a day (QID) | ORAL | 0 refills | Status: DC

## 2016-09-28 ENCOUNTER — Encounter: Payer: Self-pay | Admitting: Internal Medicine

## 2016-09-28 ENCOUNTER — Ambulatory Visit (INDEPENDENT_AMBULATORY_CARE_PROVIDER_SITE_OTHER): Payer: BLUE CROSS/BLUE SHIELD | Admitting: Internal Medicine

## 2016-09-28 VITALS — BP 133/65 | HR 82 | Temp 98.0°F | Ht 67.0 in | Wt 148.0 lb

## 2016-09-28 DIAGNOSIS — R634 Abnormal weight loss: Secondary | ICD-10-CM | POA: Diagnosis not present

## 2016-09-28 DIAGNOSIS — D72823 Leukemoid reaction: Secondary | ICD-10-CM | POA: Diagnosis not present

## 2016-09-28 DIAGNOSIS — M0020 Other streptococcal arthritis, unspecified joint: Secondary | ICD-10-CM

## 2016-09-28 LAB — CBC WITH DIFFERENTIAL/PLATELET
Basophils Absolute: 0 cells/uL (ref 0–200)
Basophils Relative: 0 %
Eosinophils Absolute: 148 cells/uL (ref 15–500)
Eosinophils Relative: 1 %
HCT: 24.3 % — ABNORMAL LOW (ref 35.0–45.0)
Hemoglobin: 7.5 g/dL — ABNORMAL LOW (ref 11.7–15.5)
LYMPHS ABS: 2368 {cells}/uL (ref 850–3900)
Lymphocytes Relative: 16 %
MCH: 24 pg — ABNORMAL LOW (ref 27.0–33.0)
MCHC: 30.9 g/dL — AB (ref 32.0–36.0)
MCV: 77.9 fL — ABNORMAL LOW (ref 80.0–100.0)
MPV: 9.7 fL (ref 7.5–12.5)
Monocytes Absolute: 740 cells/uL (ref 200–950)
Monocytes Relative: 5 %
NEUTROS PCT: 78 %
Neutro Abs: 11544 cells/uL — ABNORMAL HIGH (ref 1500–7800)
PLATELETS: 472 10*3/uL — AB (ref 140–400)
RBC: 3.12 MIL/uL — AB (ref 3.80–5.10)
RDW: 19.3 % — AB (ref 11.0–15.0)
WBC: 14.8 10*3/uL — AB (ref 3.8–10.8)

## 2016-09-28 NOTE — Progress Notes (Signed)
RFV: follow up on septic arthritis of left knee  Patient ID: Angie Wright, female   DOB: 21-Sep-1955, 61 y.o.   MRN: [id number]  HPI  Angie Wright is a 61yo F who has sickle cell trait but hospitalized in late December for streptococcal arthritis, requring arthroscopic washout. Towards the end of her abtx course, she was readmitted at end of January for evaluation of ongoing left knee swelling, cough and marked leukocytosis. She underwent wash out of left knee on 1/23 y dr Stann Mainland who did extensive synovectomy. She was discharged on vancomycin til feb 5th to finish out her 6 wk Iv course of therapy. Since her markers were still slightly elevated, she was given 4 wk course of cephalexin.  She has finished her abtx for roughly 4 wk. She has finished her physical therapy and no longer uses walker or cane. Still does PT on her own at home. She denies any pain, warmth, swelling to her left knee. Still deconditioned from prolonged process  Her appetite is improving. She has gained 8 lb since we last saw her 6 wks ago.  She has upcoming colonoscopy with dr Cristina Gong next week. Also follows up with her pcp  I have reviewed her medical record since last visit  Outpatient Encounter Prescriptions as of 09/28/2016  Medication Sig  . acetaminophen (TYLENOL) 500 MG tablet Take 1,000 mg by mouth every 6 (six) hours as needed for headache (pain).  . calcium carbonate (OS-CAL - DOSED IN MG OF ELEMENTAL CALCIUM) 1250 (500 Ca) MG tablet Take 1 tablet by mouth daily with breakfast.  . cholecalciferol (VITAMIN D) 1000 units tablet Take 1,000 Units by mouth daily.  . Probiotic Product (PROBIOTIC DAILY PO) Take 1 tablet by mouth daily.  . cephALEXin (KEFLEX) 500 MG capsule Take 1 capsule (500 mg total) by mouth 4 (four) times daily. (Patient not taking: Reported on 09/28/2016)   No facility-administered encounter medications on file as of 09/28/2016.      Patient Active Problem List   Diagnosis Date Noted  .  Generalized weakness   . Pneumonia 06/24/2016  . Leukocytosis 06/23/2016  . Thrush 06/07/2016  . Streptococcal arthritis of left knee ([place])   . Fever in adult   . Sepsis ([place])   . Right foot pain   . Septic arthritis of knee, left ([place]) 06/01/2016  . Sepsis, Gram positive ([place]) 06/01/2016  . Anemia 06/01/2016  . Sickle cell trait ([place]) 06/01/2016     Health Maintenance Due  Topic Date Due  . TETANUS/TDAP  02/21/1974  . PAP SMEAR  02/22/1976  . MAMMOGRAM  02/21/2005  . COLONOSCOPY  02/21/2005     Review of Systems Review of Systems  Constitutional: Negative for fever, chills, diaphoresis, activity change, appetite change, fatigue and unexpected weight change.  HENT: Negative for congestion, sore throat, rhinorrhea, sneezing, trouble swallowing and sinus pressure.  Eyes: Negative for photophobia and visual disturbance.  Respiratory: Negative for cough, chest tightness, shortness of breath, wheezing and stridor.  Cardiovascular: Negative for chest pain, palpitations and leg swelling.  Gastrointestinal: Negative for nausea, vomiting, abdominal pain, diarrhea, constipation, blood in stool, abdominal distention and anal bleeding.  Genitourinary: Negative for dysuria, hematuria, flank pain and difficulty urinating.  Musculoskeletal: Negative for myalgias, back pain, joint swelling, arthralgias and gait problem.  Skin: Negative for color change, pallor, rash and wound.  Neurological: Negative for dizziness, tremors, weakness and light-headedness.  Hematological: Negative for adenopathy. Does not bruise/bleed easily.  Psychiatric/Behavioral: Negative for behavioral problems, confusion, sleep  disturbance, dysphoric mood, decreased concentration and agitation.    Physical Exam   BP 133/65   Pulse 82   Temp 98 F (36.7 C)   Ht 5\' 7"  (1.702 m)   Wt 148 lb (67.1 kg)   BMI 23.18 kg/m    Physical Exam  Constitutional:  oriented to person, place, and time. appears well-developed and  well-nourished. No distress.  HENT: [place]/AT, PERRLA, no scleral icterus Lymphadenopathy: no cervical adenopathy. No axillary adenopathy Neurological: alert and oriented to person, place, and time. Normal gait. Ext: no swelling to knee joints. Full range of motion Skin: Skin is warm and dry. No rash noted. No erythema.  Psychiatric: a normal mood and affect.  behavior is normal.   CBC Lab Results  Component Value Date   WBC 23.4 (H) 08/08/2016   RBC 3.41 (L) 08/08/2016   HGB 8.4 (L) 08/08/2016   HCT 26.1 (L) 08/08/2016   PLT 700 (H) 08/08/2016   MCV 76.5 (L) 08/08/2016   MCH 24.6 (L) 08/08/2016   MCHC 32.2 08/08/2016   RDW 20.4 (H) 08/08/2016   LYMPHSABS 3,276 08/08/2016   MONOABS 936 08/08/2016   EOSABS 234 08/08/2016    BMET Lab Results  Component Value Date   NA 137 06/28/2016   K 3.9 06/28/2016   CL 105 06/28/2016   CO2 25 06/28/2016   GLUCOSE 105 (H) 06/28/2016   BUN 5 (L) 06/28/2016   CREATININE 0.66 06/28/2016   CALCIUM 9.1 06/28/2016   GFRNONAA >60 06/28/2016   GFRAA >60 06/28/2016   Lab Results  Component Value Date   ESRSEDRATE 12 09/28/2016   Lab Results  Component Value Date   CRP 6.6 09/28/2016     Assessment and Plan  History of streptococcal arthritis = will check her sed rate and crp to see that they are trending down  Hx of leukocytosis = will repeat to see it is trending down. She does not have overt infectious reason to cause leukocytosis  Weight loss = appears to be gain weight back. Her baseline weight is in 170. I recommend to continue to supplement her diet with protein drinks until her appetite returns back to baseline.  rtc prn  Addendum: labs have normalized. No further abtx needed

## 2016-09-29 LAB — SEDIMENTATION RATE: SED RATE: 12 mm/h (ref 0–30)

## 2016-09-29 LAB — C-REACTIVE PROTEIN: CRP: 6.6 mg/L (ref <8.0)

## 2016-11-04 NOTE — Addendum Note (Signed)
Addendum  created 11/04/16 0837 by Duane Boston, MD   Sign clinical note

## 2016-11-08 ENCOUNTER — Telehealth: Payer: Self-pay | Admitting: Oncology

## 2016-11-08 NOTE — Telephone Encounter (Signed)
Rescheduled appt per patient . - patient is aware of new appt date and time.

## 2016-11-14 ENCOUNTER — Ambulatory Visit: Payer: BLUE CROSS/BLUE SHIELD | Admitting: Oncology

## 2016-11-14 ENCOUNTER — Other Ambulatory Visit: Payer: BLUE CROSS/BLUE SHIELD

## 2016-12-22 ENCOUNTER — Other Ambulatory Visit (HOSPITAL_BASED_OUTPATIENT_CLINIC_OR_DEPARTMENT_OTHER): Payer: BLUE CROSS/BLUE SHIELD

## 2016-12-22 ENCOUNTER — Telehealth: Payer: Self-pay | Admitting: Oncology

## 2016-12-22 ENCOUNTER — Ambulatory Visit (HOSPITAL_BASED_OUTPATIENT_CLINIC_OR_DEPARTMENT_OTHER): Payer: BLUE CROSS/BLUE SHIELD | Admitting: Oncology

## 2016-12-22 ENCOUNTER — Telehealth: Payer: Self-pay | Admitting: *Deleted

## 2016-12-22 VITALS — BP 147/73 | HR 74 | Temp 98.3°F | Resp 20 | Ht 67.0 in | Wt 152.2 lb

## 2016-12-22 DIAGNOSIS — D649 Anemia, unspecified: Secondary | ICD-10-CM

## 2016-12-22 DIAGNOSIS — D573 Sickle-cell trait: Secondary | ICD-10-CM

## 2016-12-22 DIAGNOSIS — D509 Iron deficiency anemia, unspecified: Secondary | ICD-10-CM

## 2016-12-22 LAB — CBC WITH DIFFERENTIAL/PLATELET
BASO%: 0.8 % (ref 0.0–2.0)
Basophils Absolute: 0.1 10*3/uL (ref 0.0–0.1)
EOS ABS: 0.1 10*3/uL (ref 0.0–0.5)
EOS%: 1.2 % (ref 0.0–7.0)
HEMATOCRIT: 23.1 % — AB (ref 34.8–46.6)
HEMOGLOBIN: 7.8 g/dL — AB (ref 11.6–15.9)
LYMPH#: 2.7 10*3/uL (ref 0.9–3.3)
LYMPH%: 30.1 % (ref 14.0–49.7)
MCH: 23.8 pg — ABNORMAL LOW (ref 25.1–34.0)
MCHC: 33.8 g/dL (ref 31.5–36.0)
MCV: 70.4 fL — ABNORMAL LOW (ref 79.5–101.0)
MONO#: 0.4 10*3/uL (ref 0.1–0.9)
MONO%: 4.6 % (ref 0.0–14.0)
NEUT%: 63.3 % (ref 38.4–76.8)
NEUTROS ABS: 5.6 10*3/uL (ref 1.5–6.5)
PLATELETS: 363 10*3/uL (ref 145–400)
RBC: 3.28 10*6/uL — ABNORMAL LOW (ref 3.70–5.45)
RDW: 17.8 % — AB (ref 11.2–14.5)
WBC: 8.9 10*3/uL (ref 3.9–10.3)
nRBC: 1 % — ABNORMAL HIGH (ref 0–0)

## 2016-12-22 LAB — FERRITIN: Ferritin: 789 ng/ml — ABNORMAL HIGH (ref 9–269)

## 2016-12-22 LAB — IRON AND TIBC
%SAT: 61 % — AB (ref 21–57)
IRON: 134 ug/dL (ref 41–142)
TIBC: 219 ug/dL — ABNORMAL LOW (ref 236–444)
UIBC: 85 ug/dL — AB (ref 120–384)

## 2016-12-22 NOTE — Telephone Encounter (Signed)
Spoke with patient, per dr Alen Blew, her iron is ok no need for infusion.

## 2016-12-22 NOTE — Progress Notes (Signed)
Hematology and Oncology Follow Up Visit  Angie Wright [id number] Apr 06, 1956 61 y.o. 12/22/2016 8:57 AM Angie Wright, MDWebb, [name], MD   Principle Diagnosis: 61 year old woman with microcytic anemia related to sickle cell trait as well as iron deficiency diagnosed in 2015.   Prior Therapy: She status post IV iron and Procrit cell transfusion in the past.  Current therapy: Observation and surveillance and iron replacement as needed.  Interim History: [name] presents today for a follow-up visit. Since the last visit, she reports no recent complaints. She underwent a colonoscopy and no abnormalities noted at this time. She will have a repeat colonoscopy in 10 years. She denied any hematochezia, melena or hemoptysis. She denies any fatigue, tiredness or dyspnea. She denied any strange cravings or ice cravings. She continues to perform activities of daily living without any decline. She denies any excessive fatigue or tiredness. She is not report any headaches, blurry vision, syncope or seizures. She does not report any fevers, chills or sweats. She does not report any cough, wheezing or hemoptysis. She does not report any nausea, vomiting or abdominal pain. She does not report any frequency urgency or hesitancy. She does not report any skeletal complaints. Remaining review of systems unremarkable.   Medications: I have reviewed the patient's current medications.  Current Outpatient Prescriptions  Medication Sig Dispense Refill  . acetaminophen (TYLENOL) 500 MG tablet Take 1,000 mg by mouth every 6 (six) hours as needed for headache (pain).    . calcium carbonate (OS-CAL - DOSED IN MG OF ELEMENTAL CALCIUM) 1250 (500 Ca) MG tablet Take 1 tablet by mouth daily with breakfast.    . cholecalciferol (VITAMIN D) 1000 units tablet Take 1,000 Units by mouth daily.    . Probiotic Product (PROBIOTIC DAILY PO) Take 1 tablet by mouth daily.     No current facility-administered medications for this visit.       Allergies: No Known Allergies  Past Medical History, Surgical history, Social history, and Family History were reviewed and updated.  Marland Kitchen Physical Exam: Blood pressure (!) 147/73, pulse 74, temperature 98.3 F (36.8 C), temperature source Oral, resp. rate 20, height 5\' 7"  (1.702 m), weight 152 lb 3.2 oz (69 kg), SpO2 98 %. ECOG: 0 General appearance: alert and cooperative Head: Normocephalic, without obvious abnormality Neck: no adenopathy Lymph nodes: Cervical, supraclavicular, and axillary nodes normal. Heart:regular rate and rhythm, S1, S2 normal, no murmur, click, rub or gallop Lung:chest clear, no wheezing, rales, normal symmetric air entry Abdomin: soft, non-tender, without masses or organomegaly EXT:no erythema, induration, or nodules   Lab Results: Lab Results  Component Value Date   WBC 8.9 12/22/2016   HGB 7.8 (L) 12/22/2016   HCT 23.1 (L) 12/22/2016   MCV 70.4 (L) 12/22/2016   PLT 363 12/22/2016     Chemistry      Component Value Date/Time   NA 137 06/28/2016 0519   K 3.9 06/28/2016 0519   CL 105 06/28/2016 0519   CO2 25 06/28/2016 0519   BUN 5 (L) 06/28/2016 0519   CREATININE 0.66 06/28/2016 0519      Component Value Date/Time   CALCIUM 9.1 06/28/2016 0519   ALKPHOS 360 (H) 06/24/2016 0647   AST 38 06/24/2016 0647   ALT 50 06/24/2016 0647   BILITOT 0.8 06/24/2016 0647       Impression and Plan:  61 year old woman with the following issues:  1. Multifactorial anemia: She has a history of sickle cell trait although her baseline hemoglobin has been lower  than expected. Her hemoglobin was around 7 in 2015. She did develop an element of iron deficiency as well as anemia of chronic disease.   Her hemoglobin is 7.8 today which could be close to her baseline. The plan is to repeat iron studies today and supplemental iron as needed. Risks and benefits of intravenous iron in the form of Feraheme was discussed today and she is agreeable to receive it if  her iron studies are low. I will also obtain a hemoglobin electrophoresis to document her hemoglobinopathy.  2. Follow-up: Will be in 4 months to repeat iron studies as needed.   [facility], MD 7/20/20188:57 AM

## 2016-12-22 NOTE — Telephone Encounter (Signed)
Scheduled appt per 7/20 los - Gave patient AVS and calender per los. - lab and f/u in 4 months.

## 2016-12-22 NOTE — Telephone Encounter (Signed)
-----   Message from Wyatt Portela, MD sent at 12/22/2016 10:11 AM EDT ----- Please let her know er iron is ok. No need for iron.

## 2017-02-09 ENCOUNTER — Telehealth: Payer: Self-pay | Admitting: Oncology

## 2017-02-09 NOTE — Telephone Encounter (Signed)
Patient schedule updated for November appointments.

## 2017-04-14 IMAGING — MR MR KNEE*L* W/O CM
4 of 6 series · 21 of 40 positions shown · non-contrast
Comparison: Radiographs 06/25/2016

CLINICAL DATA: Chronic left knee pain and swelling.

EXAM:
MRI OF THE LEFT KNEE WITHOUT CONTRAST
TECHNIQUE: Multiplanar, multisequence MR imaging of the knee was performed. No
intravenous contrast was administered.

[Series 2: PD fat-sat · axial · 4.0mm · 0.29mm/px · z∈[-43,+127]mm · 8 of 35 slices shown (1 of 4)]
[im 1/35]
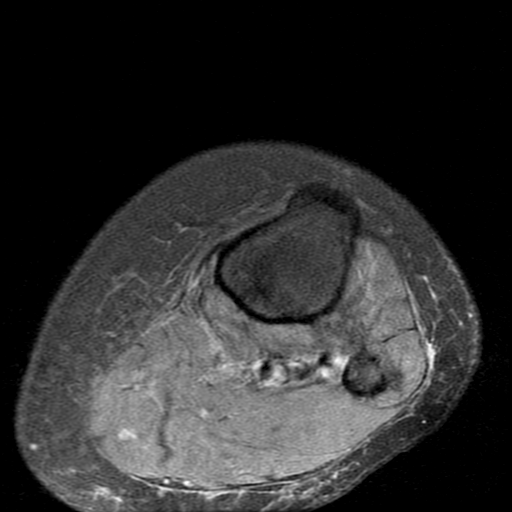
[im 5/35]
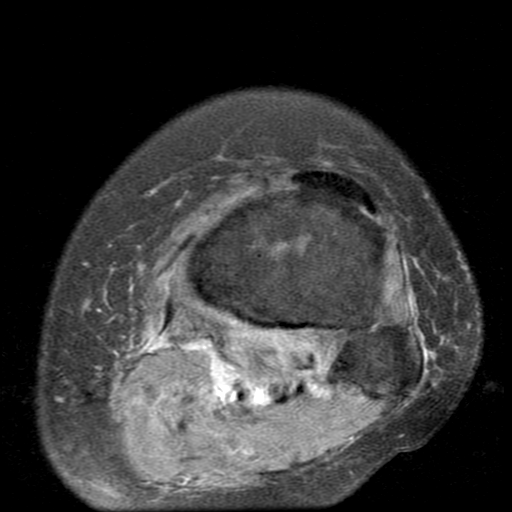
[im 10/35]
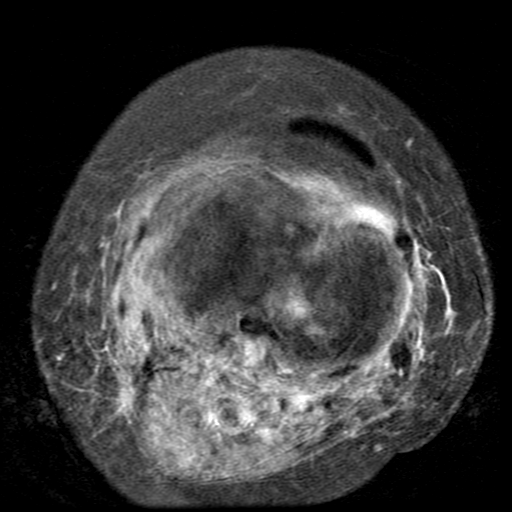
[im 15/35]
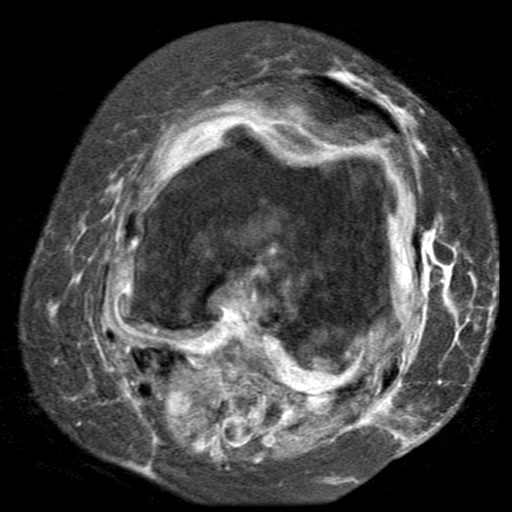
[im 20/35]
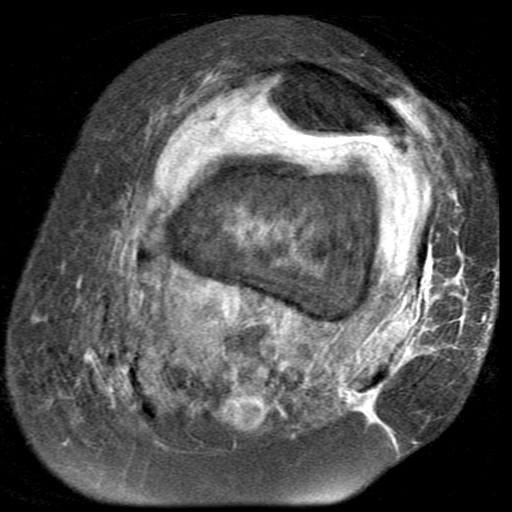
[im 25/35]
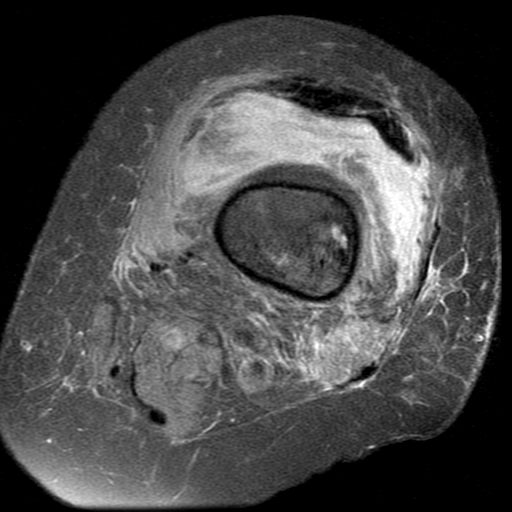
[im 30/35]
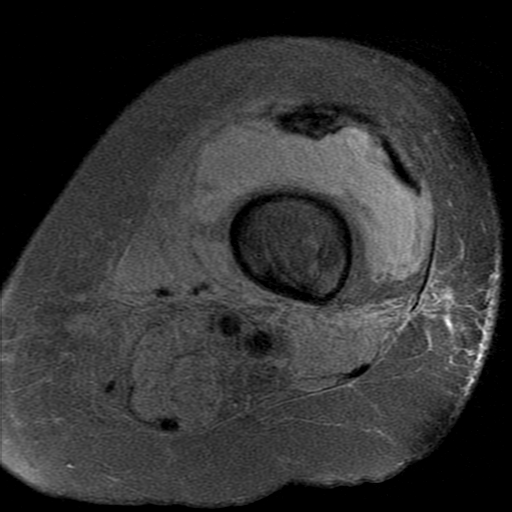
[im 35/35]
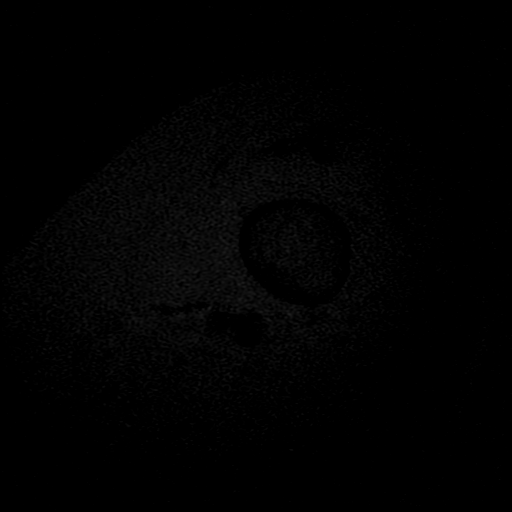

[Series 3: PD fat-sat · coronal · 4.0mm · 0.35mm/px · 7 of 31 slices shown (2 of 4)]
[im 1/31]
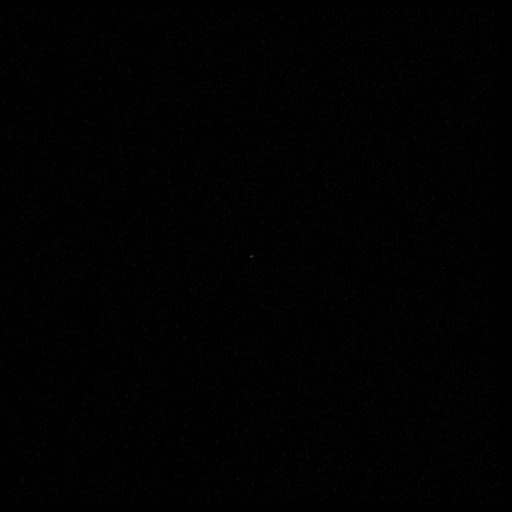
[im 6/31]
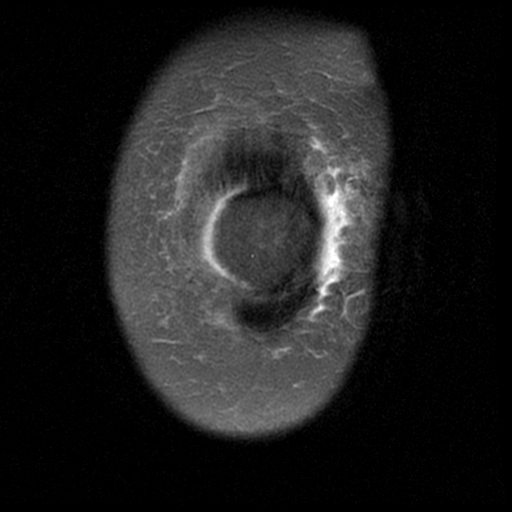
[im 11/31]
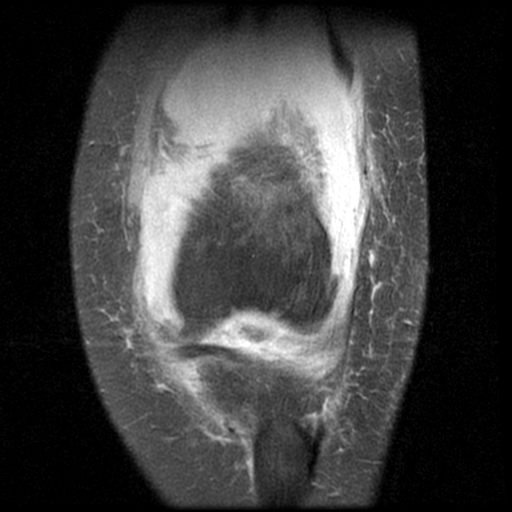
[im 16/31]
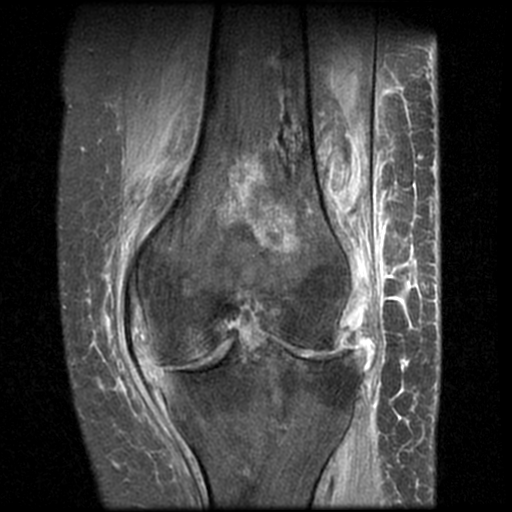
[im 21/31]
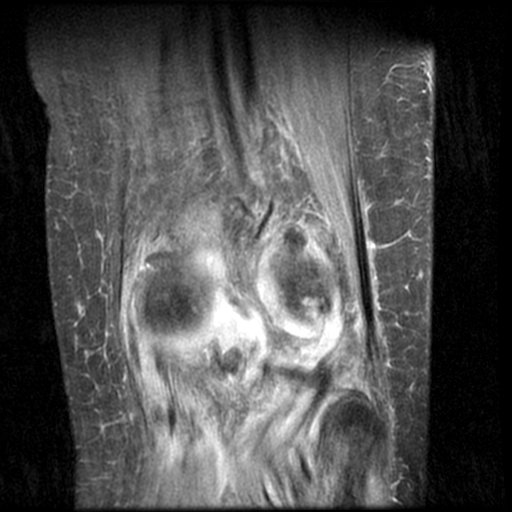
[im 26/31]
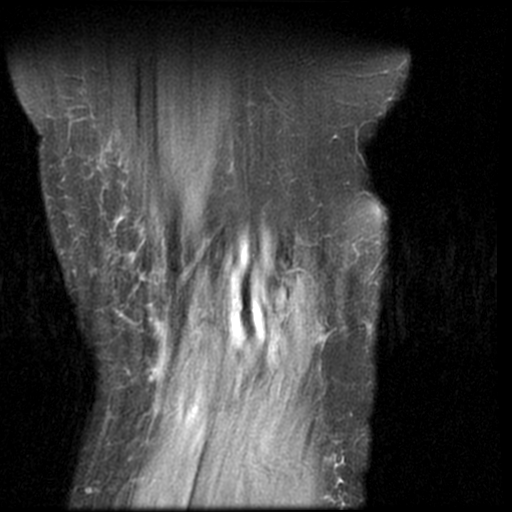
[im 31/31]
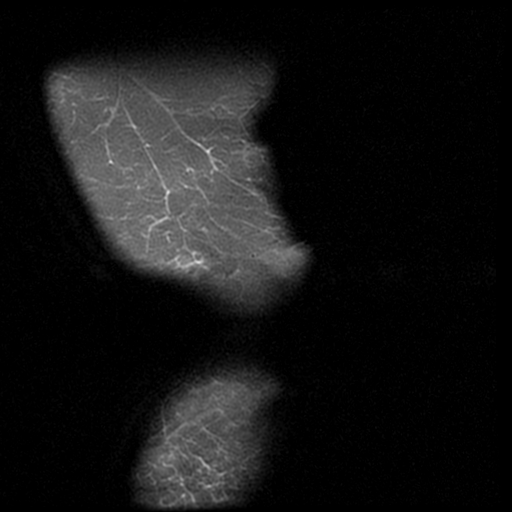

[Series 5: PD fat-sat · sagittal · 4.0mm · 0.31mm/px · 3 of 24 slices shown (3 of 4)]
[im 5/24]
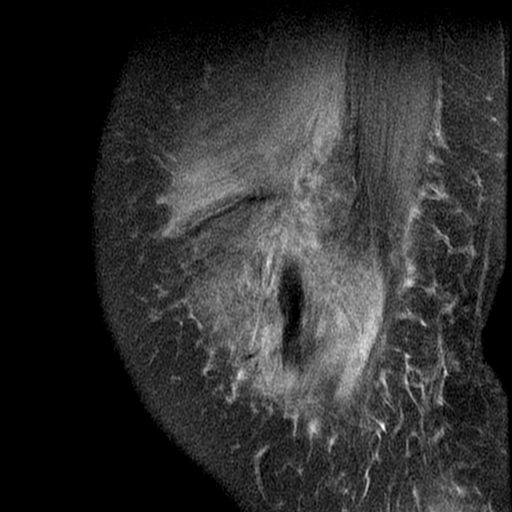
[im 14/24]
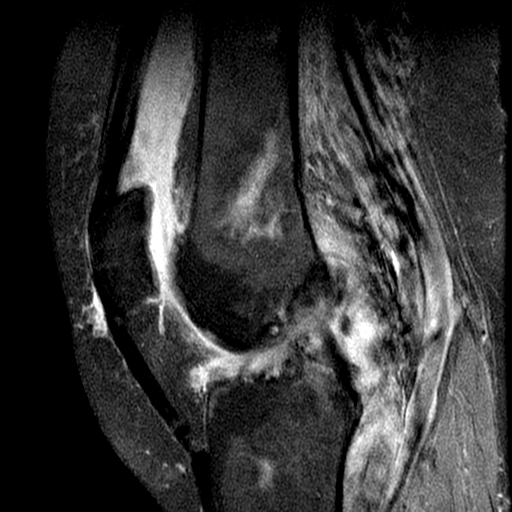
[im 24/24]
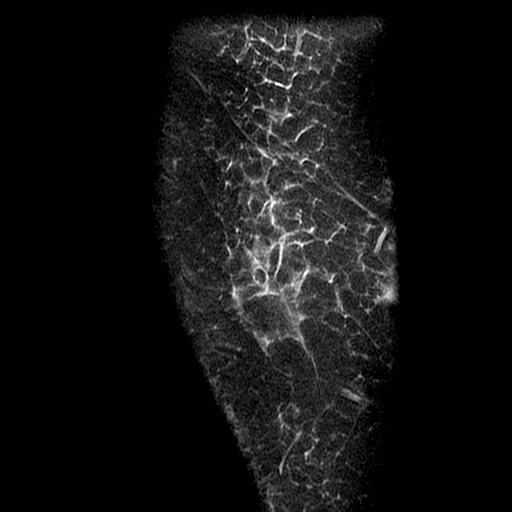

[Series 7: PD fat-sat · coronal · 2.0mm · 0.70mm/px · 3 of 20 slices shown (4 of 4)]
[im 1/20]
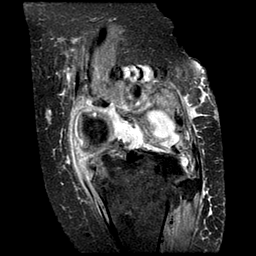
[im 10/20]
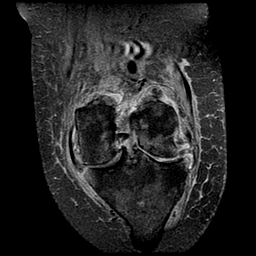
[im 20/20]
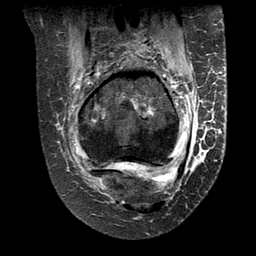

[21 of 40 positions shown; findings below may reference images not displayed]

FINDINGS: Exam is significantly limited by patient motion.

MENISCI

Medial meniscus:  Severely degenerated and torn.

Lateral meniscus:  Severely degenerated and torn.

LIGAMENTS

Cruciates: Severe mucoid degeneration of the ACL. The PCL is intact.

Collaterals:  Grossly intact.  Mcl and pes anserine bursitis noted.

CARTILAGE

Patellofemoral:  Moderate degenerative chondrosis/ chondromalacia.

Medial: Advanced degenerative chondrosis with full or near
full-thickness cartilage loss, joint space narrowing, osteophytic
spurring and subchondral edema.

Lateral: Advanced degenerative chondrosis with areas of full
thickness cartilage loss, joint space narrowing and spurring.

Joint:  Large joint effusion and moderate synovitis.

Popliteal Fossa:  Small Baker's cyst.

Extensor Mechanism: The patella retinacular structures are intact
and the quadriceps and patellar tendons are intact.

Bones: No acute bony findings. metaphyseal bone infarcts are noted.

Other: The knee musculature is grossly normal.
IMPRESSION: 1. Very limited examination due to patient motion.
2. Significant tricompartmental degenerative changes.
3. Degenerated and torn menisci.
4. Grossly intact ligamentous structures. Mucoid degeneration of the
ACL is noted.
5. Large joint effusion and moderate synovitis.

## 2017-04-25 ENCOUNTER — Other Ambulatory Visit: Payer: BLUE CROSS/BLUE SHIELD

## 2017-04-25 ENCOUNTER — Ambulatory Visit: Payer: BLUE CROSS/BLUE SHIELD | Admitting: Oncology

## 2017-05-04 ENCOUNTER — Ambulatory Visit: Payer: BLUE CROSS/BLUE SHIELD | Admitting: Oncology

## 2017-05-04 ENCOUNTER — Other Ambulatory Visit: Payer: BLUE CROSS/BLUE SHIELD

## 2017-05-11 ENCOUNTER — Other Ambulatory Visit (HOSPITAL_BASED_OUTPATIENT_CLINIC_OR_DEPARTMENT_OTHER): Payer: BLUE CROSS/BLUE SHIELD

## 2017-05-11 ENCOUNTER — Telehealth: Payer: Self-pay | Admitting: Oncology

## 2017-05-11 ENCOUNTER — Ambulatory Visit (HOSPITAL_BASED_OUTPATIENT_CLINIC_OR_DEPARTMENT_OTHER): Payer: BLUE CROSS/BLUE SHIELD | Admitting: Oncology

## 2017-05-11 VITALS — BP 160/67 | HR 95 | Temp 98.0°F | Resp 18 | Wt 160.4 lb

## 2017-05-11 DIAGNOSIS — D649 Anemia, unspecified: Secondary | ICD-10-CM

## 2017-05-11 DIAGNOSIS — D573 Sickle-cell trait: Secondary | ICD-10-CM | POA: Diagnosis not present

## 2017-05-11 DIAGNOSIS — D509 Iron deficiency anemia, unspecified: Secondary | ICD-10-CM

## 2017-05-11 LAB — CBC WITH DIFFERENTIAL/PLATELET
BASO%: 0.8 % (ref 0.0–2.0)
Basophils Absolute: 0.1 10*3/uL (ref 0.0–0.1)
EOS ABS: 0.1 10*3/uL (ref 0.0–0.5)
EOS%: 1.4 % (ref 0.0–7.0)
HEMATOCRIT: 24.9 % — AB (ref 34.8–46.6)
HGB: 8.2 g/dL — ABNORMAL LOW (ref 11.6–15.9)
LYMPH%: 22.9 % (ref 14.0–49.7)
MCH: 23.2 pg — AB (ref 25.1–34.0)
MCHC: 32.9 g/dL (ref 31.5–36.0)
MCV: 70.5 fL — AB (ref 79.5–101.0)
MONO#: 0.6 10*3/uL (ref 0.1–0.9)
MONO%: 5.4 % (ref 0.0–14.0)
NEUT#: 7.1 10*3/uL — ABNORMAL HIGH (ref 1.5–6.5)
NEUT%: 69.5 % (ref 38.4–76.8)
PLATELETS: 359 10*3/uL (ref 145–400)
RBC: 3.53 10*6/uL — ABNORMAL LOW (ref 3.70–5.45)
RDW: 18.7 % — ABNORMAL HIGH (ref 11.2–14.5)
WBC: 10.2 10*3/uL (ref 3.9–10.3)
lymph#: 2.3 10*3/uL (ref 0.9–3.3)
nRBC: 1 % — ABNORMAL HIGH (ref 0–0)

## 2017-05-11 LAB — IRON AND TIBC
%SAT: 32 % (ref 21–57)
IRON: 76 ug/dL (ref 41–142)
TIBC: 240 ug/dL (ref 236–444)
UIBC: 164 ug/dL (ref 120–384)

## 2017-05-11 LAB — FERRITIN: FERRITIN: 500 ng/mL — AB (ref 9–269)

## 2017-05-11 NOTE — Telephone Encounter (Signed)
Gave avs and calendar for June 2019  °

## 2017-05-11 NOTE — Progress Notes (Signed)
Hematology and Oncology Follow Up Visit  Angie Wright [id number] 1956-02-25 61 y.o. 05/11/2017 9:55 AM Angie Wright, MDWebb, [name], MD   Principle Diagnosis: 61 year old woman with microcytic anemia related to sickle cell trait as well as iron deficiency diagnosed in 2015.   Prior Therapy: She status post IV iron and Procrit cell transfusion in the past.   Current therapy: Observation and surveillance and iron replacement as needed.  Interim History: Angie Wright presents today for a follow-up visit. Since the last visit, she reports feeling well without any complaints. She denied any hematochezia, melena or hemoptysis. She denies any fatigue, tiredness or dyspnea. She denied any strange cravings or ice cravings. She continues to perform activities of daily living without any decline.  She denies any recent hospitalizations or illnesses.   She is not report any headaches, blurry vision, syncope or seizures. She does not report any fevers, chills or sweats. She does not report any cough, wheezing or hemoptysis. She does not report any nausea, vomiting or abdominal pain. She does not report any frequency urgency or hesitancy. She does not report any skeletal complaints. Remaining review of systems unremarkable.   Medications: I have reviewed the patient's current medications.  Current Outpatient Medications  Medication Sig Dispense Refill  . acetaminophen (TYLENOL) 500 MG tablet Take 1,000 mg by mouth every 6 (six) hours as needed for headache (pain).    . calcium carbonate (OS-CAL - DOSED IN MG OF ELEMENTAL CALCIUM) 1250 (500 Ca) MG tablet Take 1 tablet by mouth daily with breakfast.    . cholecalciferol (VITAMIN D) 1000 units tablet Take 1,000 Units by mouth daily.    . cholecalciferol (VITAMIN D) 1000 units tablet Vitamin D    . DUEXIS 800-26.6 MG TABS Take 1 tablet by mouth 3 (three) times daily.  2  . Probiotic Product (PROBIOTIC DAILY PO) Take 1 tablet by mouth daily.     No current  facility-administered medications for this visit.      Allergies: No Known Allergies  Past Medical History, Surgical history, Social history, and Family History were reviewed and updated.  Marland Kitchen Physical Exam: Blood pressure (!) 160/67, pulse 95, temperature 98 F (36.7 C), temperature source Oral, resp. rate 18, weight 160 lb 6 oz (72.7 kg), SpO2 100 %. ECOG: 0 General appearance: alert and cooperative appeared without distress. Head: Normocephalic, without obvious abnormality oral thrush or ulcers. Neck: no adenopathy Lymph nodes: Cervical, supraclavicular, and axillary nodes normal. Heart:regular rate and rhythm, S1, S2 normal, no murmur, click, rub or gallop Lung:chest clear, no wheezing, rales, normal symmetric air entry Abdomin: soft, non-tender, without masses or organomegaly EXT:no erythema, induration, or nodules   Lab Results: Lab Results  Component Value Date   WBC 10.2 05/11/2017   HGB 8.2 (L) 05/11/2017   HCT 24.9 (L) 05/11/2017   MCV 70.5 (L) 05/11/2017   PLT 359 05/11/2017     Chemistry      Component Value Date/Time   NA 137 06/28/2016 0519   K 3.9 06/28/2016 0519   CL 105 06/28/2016 0519   CO2 25 06/28/2016 0519   BUN 5 (L) 06/28/2016 0519   CREATININE 0.66 06/28/2016 0519      Component Value Date/Time   CALCIUM 9.1 06/28/2016 0519   ALKPHOS 360 (H) 06/24/2016 0647   AST 38 06/24/2016 0647   ALT 50 06/24/2016 0647   BILITOT 0.8 06/24/2016 0647       Impression and Plan:  61 year old woman with the following issues:  1.  Multifactorial anemia: She has a history of sickle cell trait with an element of iron deficiency.  Her hemoglobin today is close to baseline without any evidence of iron deficiency detected in July 2018.  The plan is to continue with active surveillance at this time and replace iron as needed.  I see no need for growth factor support at this time.  2. Follow-up: Will be in 6 months to repeat iron studies as needed.   Angie Button, MD 12/7/20189:55 AM

## 2017-05-17 LAB — HEMOGLOBINOPATHY EVALUATION
HGB C: 0 %
HGB S: 85.7 % — AB
HGB VARIANT: 0 %
Hemoglobin A2 Quantitation: 4.4 % — ABNORMAL HIGH (ref 1.8–3.2)
Hemoglobin F Quantitation: 9.9 % — ABNORMAL HIGH (ref 0.0–2.0)
Hgb A: 0 % — ABNORMAL LOW (ref 96.4–98.8)

## 2017-11-09 ENCOUNTER — Inpatient Hospital Stay: Payer: BLUE CROSS/BLUE SHIELD | Attending: Oncology | Admitting: Oncology

## 2017-11-09 ENCOUNTER — Inpatient Hospital Stay: Payer: BLUE CROSS/BLUE SHIELD

## 2017-11-09 ENCOUNTER — Telehealth: Payer: Self-pay

## 2017-11-09 VITALS — BP 152/60 | HR 88 | Temp 98.6°F | Resp 18 | Ht 67.0 in | Wt 167.0 lb

## 2017-11-09 DIAGNOSIS — D6489 Other specified anemias: Secondary | ICD-10-CM

## 2017-11-09 DIAGNOSIS — I10 Essential (primary) hypertension: Secondary | ICD-10-CM | POA: Diagnosis not present

## 2017-11-09 DIAGNOSIS — D649 Anemia, unspecified: Secondary | ICD-10-CM | POA: Diagnosis present

## 2017-11-09 DIAGNOSIS — D573 Sickle-cell trait: Secondary | ICD-10-CM

## 2017-11-09 DIAGNOSIS — D509 Iron deficiency anemia, unspecified: Secondary | ICD-10-CM | POA: Diagnosis not present

## 2017-11-09 LAB — CBC WITH DIFFERENTIAL/PLATELET
BASOS ABS: 0.1 10*3/uL (ref 0.0–0.1)
Basophils Relative: 1 %
EOS PCT: 1 %
Eosinophils Absolute: 0.1 10*3/uL (ref 0.0–0.5)
HCT: 22.6 % — ABNORMAL LOW (ref 34.8–46.6)
Hemoglobin: 7.7 g/dL — ABNORMAL LOW (ref 11.6–15.9)
LYMPHS ABS: 2 10*3/uL (ref 0.9–3.3)
Lymphocytes Relative: 22 %
MCH: 23.4 pg — ABNORMAL LOW (ref 25.1–34.0)
MCHC: 34.1 g/dL (ref 31.5–36.0)
MCV: 68.7 fL — AB (ref 79.5–101.0)
Monocytes Absolute: 0.5 10*3/uL (ref 0.1–0.9)
Monocytes Relative: 6 %
Neutro Abs: 6.3 10*3/uL (ref 1.5–6.5)
Neutrophils Relative %: 70 %
PLATELETS: 353 10*3/uL (ref 145–400)
RBC: 3.29 MIL/uL — AB (ref 3.70–5.45)
RDW: 19.9 % — AB (ref 11.2–14.5)
WBC: 8.9 10*3/uL (ref 3.9–10.3)
nRBC: 1 /100 WBC — ABNORMAL HIGH

## 2017-11-09 LAB — FERRITIN: FERRITIN: 441 ng/mL — AB (ref 9–269)

## 2017-11-09 LAB — IRON AND TIBC
Iron: 94 ug/dL (ref 41–142)
SATURATION RATIOS: 39 % (ref 21–57)
TIBC: 238 ug/dL (ref 236–444)
UIBC: 144 ug/dL

## 2017-11-09 NOTE — Progress Notes (Signed)
Hematology and Oncology Follow Up Visit  Angie Wright [id number] 08/15/1955 62 y.o. 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   Prior Therapy: She status post IV iron and packed red cell transfusion.  Current therapy: Active surveillance   Interim History: Angie Wright is here for a follow-up by herself.  She reports no changes in her health since the last visit.  She denies any excessive fatigue or tiredness.  She denies any changes in her activity level or performance status.  She denies any dyspnea on exertion or chest pain.  She denies any hospitalization or illnesses.  She does not report any medic easy or melena.   She is not report any headaches, blurry vision, syncope or seizures.  She denies any alteration of mental status or confusion.  She does not report any fevers, chills or sweats. She does not report any cough, wheezing or hemoptysis.  He denies any chest pain, palpitation or leg edema.  She does not report any nausea, vomiting or abdominal pain. She does not report any frequency urgency or hesitancy. She does not report any arthralgias or myalgias.  She denies any easy bruising.  Remaining review of systems is negative.  Medications: I have reviewed the patient's current medications.  Current Outpatient Medications  Medication Sig Dispense Refill  . acetaminophen (TYLENOL) 500 MG tablet Take 1,000 mg by mouth every 6 (six) hours as needed for headache (pain).    . calcium carbonate (OS-CAL - DOSED IN MG OF ELEMENTAL CALCIUM) 1250 (500 Ca) MG tablet Take 1 tablet by mouth daily with breakfast.    . cholecalciferol (VITAMIN D) 1000 units tablet Take 1,000 Units by mouth daily.    . cholecalciferol (VITAMIN D) 1000 units tablet Vitamin D    . DUEXIS 800-26.6 MG TABS Take 1 tablet by mouth 3 (three) times daily.  2  . Probiotic  Product (PROBIOTIC DAILY PO) Take 1 tablet by mouth daily.     No current facility-administered medications for this visit.      Allergies: No Known Allergies  Past Medical History, Surgical history, Social history, and Family History were reviewed and updated.  Marland Kitchen Physical Exam: Blood pressure (!) 152/60, pulse 88, temperature 98.6 F (37 C), resp. rate 18, height 5\' 7"  (1.702 m), weight 167 lb (75.8 kg), SpO2 99 %. ECOG: 0 General appearance: Well-appearing woman without distress. Head: Atraumatic without abnormalities. Oropharynx: Without any thrush or ulcers. Eyes: No scleral icterus. Lymph nodes: No lymphadenopathy noted in the cervical, supraclavicular, or axillary nodes. Heart:regular rate and rhythm, without any murmurs or gallops. Lung: Clear to auscultation without any rhonchi, wheezes or dullness to percussion. Abdomin: Soft, nontender with any rebound or guarding.  No shifting dullness or ascites. Skull skeletal: No joint deformity or effusion.   Lab Results: Lab Results  Component Value Date   WBC 10.2 05/11/2017   HGB 8.2 (L) 05/11/2017   HCT 24.9 (L) 05/11/2017   MCV 70.5 (L) 05/11/2017   PLT 359 05/11/2017     Chemistry      Component Value Date/Time   NA 137 06/28/2016 0519   K 3.9 06/28/2016 0519   CL 105 06/28/2016 0519   CO2 25 06/28/2016 0519   BUN 5 (L) 06/28/2016 0519   CREATININE 0.66 06/28/2016 0519      Component Value Date/Time   CALCIUM 9.1 06/28/2016  0519   ALKPHOS 360 (H) 06/24/2016 0647   AST 38 06/24/2016 0647   ALT 50 06/24/2016 0647   BILITOT 0.8 06/24/2016 0647       Impression and Plan:  62 year old woman with::  1. Multifactorial microcytic anemia due to iron deficiency as well as sickle cell trait.  Her baseline hemoglobin has been around 8 and does respond to packed red cell transfusion in the past.  Her iron studies obtained on May 11, 2017 were within normal range and did not require any intravenous iron.  The  natural course of these findings were discussed with the patient today.  Given her hemoglobinopathy, her hemoglobin level will be a baseline between 8 and 9 at the most and she is asymptomatic from that at this point.  No intervention is needed at this time unless she is symptomatic or she has a drop in her hemoglobin at that time she will require transfusion.  She does not exhibit any sickle cell crisis phenotype without any pain crisis noted at this time.  Her labs from today are currently pending but she is asymptomatic and does not require any intervention.  2.  Hypertension: Her blood pressure is mildly elevated today I have asked her to follow-up with this issue with her primary care provider with her next visit.  3. Follow-up: Will be in 8 months and sooner if needed.  15  minutes was spent with the patient face-to-face today.  More than 50% of time was dedicated to patient counseling, education and coordination of her care.    Angie Button, MD 6/7/20198:37 AM

## 2017-11-09 NOTE — Telephone Encounter (Signed)
Printed avs and calender of upcoming appointment. Per 6/7

## 2018-05-10 ENCOUNTER — Other Ambulatory Visit (HOSPITAL_COMMUNITY): Payer: Self-pay | Admitting: Physician Assistant

## 2018-05-10 DIAGNOSIS — K148 Other diseases of tongue: Secondary | ICD-10-CM

## 2018-05-15 ENCOUNTER — Ambulatory Visit (HOSPITAL_COMMUNITY)
Admission: RE | Admit: 2018-05-15 | Discharge: 2018-05-15 | Disposition: A | Discharge status: Home / Self Care | Payer: Self-pay | Source: Ambulatory Visit | Attending: Physician Assistant | Admitting: Physician Assistant

## 2018-05-15 DIAGNOSIS — K148 Other diseases of tongue: Secondary | ICD-10-CM

## 2018-05-15 MED ORDER — IOHEXOL 300 MG/ML  SOLN
75.0000 mL | Freq: Once | INTRAMUSCULAR | Status: AC | PRN
  Administered 2018-05-15: 75 mL via INTRAVENOUS

## 2018-07-06 HISTORY — PX: PEG TUBE PLACEMENT: SUR1034

## 2018-07-08 HISTORY — PX: RADIESSE INJECTION PHARYNGOPLASTY: SHX2287

## 2018-07-08 HISTORY — PX: MANDIBLE RECONSTRUCTION: SHX431

## 2018-07-08 HISTORY — PX: VESTIBULOPLASTY: SHX2662

## 2018-07-08 HISTORY — PX: OTHER SURGICAL HISTORY: SHX169

## 2018-07-08 HISTORY — PX: TRACHEOSTOMY: SUR1362

## 2018-07-08 HISTORY — PX: MODIFIED RADICAL NECK DISSECTION: SHX2045

## 2018-07-08 HISTORY — PX: FREE FLAP W/ MICROVASCULAR ANASTOMOSIS HEAD /NECK: SUR614

## 2018-07-08 HISTORY — PX: PHARYNGECTOMY: SUR1024

## 2018-07-08 HISTORY — PX: GLOSSECTOMY: SUR645

## 2018-07-08 HISTORY — PX: DIRECT LARYNGOSCOPY: SHX5326

## 2018-07-12 ENCOUNTER — Ambulatory Visit: Payer: BLUE CROSS/BLUE SHIELD | Admitting: Oncology

## 2018-07-12 ENCOUNTER — Other Ambulatory Visit: Payer: BLUE CROSS/BLUE SHIELD

## 2018-08-01 NOTE — Progress Notes (Signed)
Head and Neck Cancer Location of Tumor / Histology:  05/08/18 FINAL PATHOLOGIC DIAGNOSIS MICROSCOPIC EXAMINATION AND DIAGNOSIS "TONGUE LESION", BIOPSY: Invasive, p16 positive, poorly differentiated squamous cell Carcinoma  07/08/18 FINAL PATHOLOGIC DIAGNOSIS MICROSCOPIC EXAMINATION AND DIAGNOSIS AMENDED REPORT AMENDED TO CORRECT THE TISSUE LOCATION FOR SPECIMENS: P, Q, AND R FROM LEFT TO RIGHT AS PER DR. PATWA, THE UPDATED PATHOLOGIC STAGE CLASSIFICATION ([place] [facility]): ypT4a, ypN1, 07-18-18 A. "RIGHT INFRATEMPORAL FOSSA", BIOPSY: Negative for malignancy. B. "LINGUAL NERVE", BIOPSY: Negative for malignancy. C. "BONE MARROW", BIOPSY: Negative for malignancy. D. "HYPOGLOSSAL NERVE", BIOPSY: Negative for malignancy. E. "DEEP TISSUE TONGUE BASE", BIOPSY: Negative for malignancy. F. "LEFT ANTERIOR TUBERCLE", BIOPSY: Negative for malignancy. G. "LEFT PHARYNX", BIOPSY: Negative for malignancy. H. "GENIOLGLOSSUS", BIOPSY: Negative for malignancy. I. "GENIOHYOID", BIOPSY: Negative for malignancy. J. "LEFT BUCCAL MUCOSA", BIOPSY: Negative for malignancy. K. "ANTERIOR BONE MARROW", BIOPSY: Negative for malignancy. L. "RETROMOLAR TRIGONE", BIOPSY: Negative for malignancy. M. "LEFT FLOOR OF MOUTH", BIOPSY: Negative for malignancy. N. "MID-TONGUE DEEP MARGIN", BIOPSY: Negative for malignancy. O. LYMPH NODE, LEVEL 1, EXCISION: One (1) level I lymph node is negative for tumor (0/1). P. LYMPH NODES, "RIGHT LEVELS 2,3,4 AND 5B", EXCISION: Metastatic carcinoma in one (of 35) left levels II, III, IV, and VB lymph nodes (1/35). The largest metastatic focus measures 2 mm. Extranodal extension is not identified. Q. LYMPH NODES, "RIGHT LEVEL 2A", EXCISION: Multiple (11) left level IIA lymph nodes are negative for tumor (0/11). R. LYMPH NODES, "RIGHT LEVEL 2B",  EXCISION: Multiple (10) left level IIB lymph nodes are negative for tumor (0/10). S. LYMPH NODES, "LEFT LEVEL 1B", EXCISION: Multiple (9) left level IB lymph nodes are negative for tumor (0/9). Benign salivary gland tissue. T. LYMPH NODES, "LEFT LEVEL 2,3,AND 4", EXCISION: Multiple (28) left levels II, III, and IV lymph nodes are negative for tumor (0/28) U. LYMPH NODES, "LEFT LEVEL 2B", EXCISION: Multiple (6) left level IIB lymph nodes are negative for tumor (0/6). V. "RIGHT HEMI-GLOSSECTOMY, MANDIBULECTOMY, LEVEL 1B AND PHARYNX": Invasive squamous cell carcinoma with therapy related changes. Tumor measures 3.8 cm. Tumor extends into the bone of the mandible. Three (3) lymph nodes are negative for tumor (0/3). All surgical margins are negative for tumor (please see parts A to N for additional margins). Teeth (gross examination only). See CAP synoptic   Patient presented with symptoms of: He presented to Dr. Erik Obey on 41/6/60 for a one month history of right sided throat pain.   Biopsies of right hemi-glossectomy, mandibulectomy, level 1B and pharynx revealed: invasive squamous cell carcinoma with therapy related changes.   Nutrition Status Yes No Comments  Weight changes? [x]  []  She has lost about 30 lbs in the last few months.   Swallowing concerns? [x]  []  She will see ST in [place] for Swallow evaluation  PEG? [x]  []     Referrals Yes No Comments  Social Work? []  [x]    Dentistry? [x]  []  She has seen a surgeon before and after surgery. She has an appointment to see them again on 08/12/18 for extraction pending Dr. Pearlie Oyster evaluation.   Swallowing therapy? [x]  []  Appointment tomorrow at [facility] for swallow evaluationl  Nutrition? [x]  []    Med/Onc? [x]  []  Dr. Maxie Better at [facility]   Safety Issues Yes No Comments  Prior radiation? []  [x]    Pacemaker/ICD? []  [x]    Possible current pregnancy? []  [x]    Is the patient on  methotrexate? []  [x]     Tobacco/Marijuana/Snuff/ETOH use: No history of smoking or alcohol use.   Past/Anticipated interventions by otolaryngology,  if any:  07/08/2018 Dr. Hendricks Limes [facility]  PROCEDURES PERFORMED:  1. Direct laryngoscopy 2. Tracheostomy with bjork flap 3. Composite resection of right oral cavity cancer including: A. Right hemimandibulectomy B. Right subtotal glossectomy C Right pharyngectomy  4. Bilateral modified radical neck dissections level 1a,1b,2a,2b,3,4,5b 5. Leftosteocutaneous scapula free flap reconstruction of mandible with a 13cm x 9cm elliptical skin paddle.  6. Microvascular anastomosis: a. Leftcircumflex scapular artery to right facial artery b. Leftcircumflex scapular vein #1 to branch of right external juguar vein with3.27mm coupler C. b. Leftcircumflex scapular vein #2 to branch of right internal juguar vein with2.69mm coupler 7. Jaw reconstruction using Stryker recon plating system - mandibular plating 8. Pharyngoplasty 9. Complex vestibuloplasty  For SCC of the oral cavity  Next apt 08/08/18   Past/Anticipated interventions by medical oncology, if any:  07/05/18 Dr. Cyril Loosen [facility] TREATMENTS: - 05/27/12: Cycle 1 TPF (docetaxel 75, cis 100, 5FU 1g/m2 D1-4). - 06/17/18: Cycle 2 TPF (docetaxel 75, cis 100, 5FU 1g/m2 D1-4). - Tentatively scheduled for surgical resection on Mon 07/08/18.  PLAN: # OC SqCC.  - Favor proceeding with surgical resection; tentatively scheduled for Monday (2/3) - We will plan to follow up with the patient after surgery  Next apt 09/11/18   Current Complaints / other details:    BP 108/62 (BP Location: Right Arm, Patient Position: Sitting)   Pulse (!) 103   Temp 97.8 F (36.6 C) (Axillary)   Resp 18   Ht 5\' 7"  (1.702 m)   Wt 118 lb (53.5 kg)   SpO2 100%   BMI 18.48 kg/m    Wt Readings from Last 3 Encounters:  08/07/18 118 lb (53.5 kg)  11/09/17 167 lb (75.8 kg)  05/11/17 160 lb 6 oz (72.7 kg)

## 2018-08-02 ENCOUNTER — Telehealth: Payer: Self-pay | Admitting: *Deleted

## 2018-08-02 NOTE — Telephone Encounter (Addendum)
Oncology Nurse Navigator Documentation  Place new referral introductory call to Ms. Eble.  LVMM on home phone and husband's mobile asking for return call.   Addendum:  Received call back from Mr. Armacost.    Introduced myself as the H&N oncology nurse navigator that works with Dr.Squire to whom his wife has been referred by Dr. Hendricks Limes.    He confirmed understanding of referral and appt date/time of 3/4 9:30 NE / 10:00 Consult.  Briefly explained my role as their navigator, indicated I would be joining them during appt next week.  Confirmed understanding of [place] location, explained arrival and RadOnc registration process.  He stated familiar with procedures as he was treated for prostate cancer by Dr. Valere Dross several years ago.  Of Note:  He confirmed wife to have additional dental extractions at [facility]:  pre-op scheduled for 3/6, surgery 3/12.  He indicated Dr. Hendricks Limes trying to advance schedule.  Provided my contact information, encouraged him to call with questions/concerns before next week.  He verbalized understanding of information provided, expressed appreciation for my call.  Navigator Needs Assessment . Employment status:  Wife works, currently on STD through 09/04/18 at which time will need renewal.   . Support system:  Husband, his sister retired Therapist, sports who is available.  Adult children and other family members live locally. . Transportation:  He is able to provide, his availability flexible for appts. Marland Kitchen PCP:  Yes, Dr. Maurice Small, [place] at [place].  She is aware of dx/treatments to date. Marland Kitchen PCD:  Yes, sees regularly.  Gayleen Orem, RN, BSN Head & Neck Oncology Nurse [place] at [place] [phone]

## 2018-08-05 ENCOUNTER — Ambulatory Visit
Admission: RE | Admit: 2018-08-05 | Discharge: 2018-08-05 | Disposition: A | Discharge status: Home / Self Care | Payer: Self-pay | Source: Ambulatory Visit | Attending: Radiation Oncology | Admitting: Radiation Oncology

## 2018-08-05 ENCOUNTER — Telehealth: Payer: Self-pay | Admitting: *Deleted

## 2018-08-05 ENCOUNTER — Other Ambulatory Visit: Payer: Self-pay | Admitting: *Deleted

## 2018-08-05 DIAGNOSIS — C411 Malignant neoplasm of mandible: Secondary | ICD-10-CM

## 2018-08-05 NOTE — Telephone Encounter (Signed)
Oncology Nurse Navigator Documentation  Called Mr. Angie Wright, confirmed understanding of this Wed's 9:30 NE, 10:00 Consult; encouraged no later than 9:15 arrival for registration.  Informed him CT SIM scheduled for 11:00 should Dr. Isidore Moos wish to proceed with RT planning.  He voiced understanding.  Gayleen Orem, RN, BSN Head & Neck Oncology Nurse [place] at [place] [phone]

## 2018-08-07 ENCOUNTER — Encounter: Payer: Self-pay | Admitting: Radiation Oncology

## 2018-08-07 ENCOUNTER — Encounter: Payer: Self-pay | Admitting: *Deleted

## 2018-08-07 ENCOUNTER — Ambulatory Visit
Admission: RE | Admit: 2018-08-07 | Discharge: 2018-08-07 | Disposition: A | Discharge status: Home / Self Care | Payer: Self-pay | Source: Ambulatory Visit | Attending: Radiation Oncology | Admitting: Radiation Oncology

## 2018-08-07 ENCOUNTER — Ambulatory Visit
Admission: RE | Admit: 2018-08-07 | Discharge: 2018-08-07 | Disposition: A | Discharge status: Home / Self Care | Payer: 59 | Source: Ambulatory Visit | Attending: Radiation Oncology | Admitting: Radiation Oncology

## 2018-08-07 ENCOUNTER — Other Ambulatory Visit: Payer: Self-pay

## 2018-08-07 VITALS — BP 108/62 | HR 103 | Temp 97.8°F | Resp 18 | Ht 67.0 in | Wt 118.0 lb

## 2018-08-07 DIAGNOSIS — D573 Sickle-cell trait: Secondary | ICD-10-CM | POA: Insufficient documentation

## 2018-08-07 DIAGNOSIS — Z510 Encounter for antineoplastic radiation therapy: Secondary | ICD-10-CM | POA: Insufficient documentation

## 2018-08-07 DIAGNOSIS — C779 Secondary and unspecified malignant neoplasm of lymph node, unspecified: Secondary | ICD-10-CM | POA: Insufficient documentation

## 2018-08-07 DIAGNOSIS — D649 Anemia, unspecified: Secondary | ICD-10-CM | POA: Insufficient documentation

## 2018-08-07 DIAGNOSIS — Z79899 Other long term (current) drug therapy: Secondary | ICD-10-CM | POA: Insufficient documentation

## 2018-08-07 DIAGNOSIS — C041 Malignant neoplasm of lateral floor of mouth: Secondary | ICD-10-CM | POA: Insufficient documentation

## 2018-08-07 DIAGNOSIS — Z9221 Personal history of antineoplastic chemotherapy: Secondary | ICD-10-CM | POA: Insufficient documentation

## 2018-08-07 DIAGNOSIS — Z86711 Personal history of pulmonary embolism: Secondary | ICD-10-CM | POA: Insufficient documentation

## 2018-08-07 HISTORY — DX: Sickle-cell trait: D57.3

## 2018-08-07 HISTORY — DX: Malignant neoplasm of mouth, unspecified: C06.9

## 2018-08-07 NOTE — Progress Notes (Signed)
Radiation Oncology         [phone] ________________________________  Initial outpatient Consultation  Name: Angie Wright MRN: [id number]  Date: 08/07/2018  DOB: 11-12-1955  DX:IPJA, [name], MD  Magister, Beverely Low, MD   REFERRING PHYSICIAN: Magister, Beverely Low, MD  DIAGNOSIS:    ICD-10-CM   1. 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 female who presented with right-sided throat pain in November.  She saw a Surveyor, quantity and was referred to [facility] for management after tongue biopsy revealed invasive  P16+ poorly differentiated squamous cell carcinoma.  CT of the neck on 05/15/2018 revealed a large right lateral tongue mass consistent with malignancy involving the mandible.  There were mildly enlarged right level 1 and level 2 lymph node suspicious for metastatic disease.  CT of the chest on 05/16/2018 was negative for metastatic disease, positive for pulmonary emboli  The patient underwent induction chemotherapy with Dr. Maxie Better.  She received 2 cycles of TPF.  She then underwent surgical resection with reconstruction under the care of Dr. Hendricks Limes.  CT of the neck on June 14, 2018 showed evidence of progressive disease in the mouth with more osseous destruction of the mandible.  CT of the neck on in July 03, 2018 showed regression of disease, slightly.   We reviewed the patient's imaging at tumor board this morning  Her surgery took place on 07/08/2018  Pathologic stage is ypT4a ypN1.  The tumor measured 3.8 cm.  1 out of the 103 lymph nodes were positive.  There is no LVSI or  PNI or extracapsular extension.  Her margins are negative by at least 2 mm.  Closest margin is at the deep tongue margin.  Histologic grade, grade 3.     Nutrition Status Yes No Comments  Weight changes? [x]  []  She has lost about 30 lbs in the last few months.   Swallowing concerns? [x]  []  She will see ST in [place] for Swallow evaluation  PEG? [x]  []     Referrals Yes No Comments  Social Work? []  [x]    Dentistry? [x]  []  She has seen a surgeon before and after surgery. She has an appointment to see them again on 08/12/18 for extraction pending Dr. Pearlie Oyster evaluation.   Swallowing therapy? [x]  []  Appointment tomorrow at [facility] for swallow evaluationl  Nutrition? [x]  []    Med/Onc? [x]  []  Dr. Maxie Better at [facility]   Safety Issues Yes No Comments  Prior radiation? []  [x]    Pacemaker/ICD? []  [x]    Possible current pregnancy? []  [x]    Is the patient on methotrexate? []  [x]     Tobacco/Marijuana/Snuff/ETOH use: No history of smoking or alcohol use.   PREVIOUS RADIATION THERAPY: No  PAST MEDICAL HISTORY:  has a past medical history of Anemia, Sickle cell trait ([place]), and Squamous cell carcinoma of oral cavity ([place]).    PAST SURGICAL HISTORY: Past Surgical History:  Procedure Laterality Date  . circumflex  Left 07/08/2018   Left circumflex scapular arter to right facial artery, left circumflex scapular vein # 1 to branch of right external jugular vein with 3.40mm coupler, left circumfliexscapular vein # 2  to branch of right internal jugular bein with 2.5 mm coupler  . DIRECT LARYNGOSCOPY  07/08/2018   [facility] Dr. Hendricks Limes  . free flap reconstruction Left 07/08/2018   left osteocutaneous scapula free flap reconstruction of manible with a 13 cm X 9 cm elliptical skin paddle.   Marland Kitchen FREE FLAP W/ MICROVASCULAR ANASTOMOSIS HEAD Lavetta Nielsen  07/08/2018   Dr. Hendricks Limes at [facility]  . GLOSSECTOMY Right 07/08/2018   Right subtotal glossectomy  . hemimandibulectomy Right 07/08/2018   [facility], Dr. Hendricks Limes  . KNEE ARTHROSCOPY Left  06/01/2016   Procedure: ARTHROSCOPIC WASHOUT LEFT KNEE;  Surgeon: Melina Schools, MD;  Location: [place];  Service: Orthopedics;  Laterality: Left;  Marland Kitchen MANDIBLE RECONSTRUCTION  07/08/2018   jaw reconstruction using [employer] recon plating system- mandibular plating  . MODIFIED RADICAL NECK DISSECTION Bilateral 07/08/2018   level 1a,1b,2a,2b,3,4,5b  . PEG TUBE PLACEMENT  07/2018   at [facility]  . PHARYNGECTOMY Right 07/08/2018   [facility] Dr. Hendricks Limes  . RADIESSE INJECTION PHARYNGOPLASTY  07/08/2018   Dr. Hendricks Limes- [facility]  . TOTAL ANKLE ARTHROPLASTY Left 06/27/2016   Procedure: ARTHROSCOPY KNEE/IRRIGATION AND DEBRIDEMENT;  Surgeon: Nicholes Stairs, MD;  Location: [place];  Service: Orthopedics;  Laterality: Left;  . TRACHEOSTOMY  07/08/2018   with bjork flap at [facility] Dr. Hendricks Limes  . VESTIBULOPLASTY  07/08/2018   Complex vestibuloplasty, Dr. Hendricks Limes- [place]: family history includes Sickle cell anemia in her brother.  SOCIAL HISTORY:  reports that she has never smoked. She has never used smokeless tobacco. She reports that she does not drink alcohol or use drugs.  ALLERGIES: Patient has no known allergies.  MEDICATIONS:  Current Outpatient Medications  Medication Sig Dispense Refill  . acetaminophen (TYLENOL) 500 MG tablet Take 1,000 mg by mouth every 6 (six) hours as needed for headache (pain).    Marland Kitchen enoxaparin (LOVENOX) 60 MG/0.6ML injection INJECT 0.5 MLS (50 MG TOTAL) INTO THE SKIN EVERY 12 HOURS FOR 30 DAYS.    . folic acid (FOLVITE) 1 MG tablet Take 1 mg by mouth daily.    Marland Kitchen KLOR-CON M10 10 MEQ tablet TAKE 2 TABLETS (20 MEQ TOTAL) BY MOUTH 2 TIMES DAILY    . oxyCODONE (OXY IR/ROXICODONE) 5 MG immediate release tablet      No current facility-administered medications for this encounter.     REVIEW OF SYSTEMS:  A 10+ POINT REVIEW OF SYSTEMS WAS OBTAINED including neurology, dermatology, psychiatry, cardiac, respiratory, lymph, extremities, GI, GU, Musculoskeletal, constitutional, breasts,  reproductive, HEENT.  All pertinent positives are noted in the HPI.  All others are negative.   PHYSICAL EXAM:  height is 5\' 7"  (1.702 m) and weight is 118 lb (53.5 kg). Her axillary temperature is 97.8 F (36.6 C). Her blood pressure is 108/62 and her pulse is 103 (abnormal). Her respiration is 18 and oxygen saturation is 100%.   General: Alert and oriented, in no acute distress, in WC HEENT: Head is normocephalic. Extraocular movements are intact. Oropharynx is notable for large tissue flap in mouth, with some limited bleeding upon inspection.  Facial edema.  No evidence of tumor Neck: Neck is notable for healing scars from surgery Heart: Regular in rate and rhythm with no murmurs, rubs, or gallops. Chest: Clear to auscultation bilaterally, with no rhonchi, wheezes, or rales. Abdomen: Soft, nontender, nondistended, with no rigidity or guarding. + PEG tube Extremities: No cyanosis or edema. Lymphatics: see Neck Exam Skin: No concerning lesions. Musculoskeletal: symmetric strength and muscle tone throughout. Neurologic: Cranial nerves  II through XII are grossly intact. No obvious focalities. Speech is fluent. Coordination is intact. Psychiatric: Judgment and insight are intact. Affect is appropriate.   ECOG = 2  0 - Asymptomatic (Fully active, able to carry on all predisease activities without restriction)  1 - Symptomatic but completely ambulatory (Restricted in physically strenuous activity but ambulatory and able to carry out work of a light or sedentary nature. For example, light housework, office work)  2 - Symptomatic, <50% in bed during the day (Ambulatory and capable of all self care but unable to carry out any work activities. Up and about more than 50% of waking hours)  3 - Symptomatic, >50% in bed, but not bedbound (Capable of only limited self-care, confined to bed or chair 50% or more of waking hours)  4 - Bedbound (Completely disabled. Cannot carry on any self-care. Totally  confined to bed or chair)  5 - Death   Eustace Pen MM, Creech RH, Tormey DC, et al. [phone]). "Toxicity and response criteria of the [facility]". [place] Oncol. 5 (6): 649-55   LABORATORY DATA:  Lab Results  Component Value Date   WBC 8.9 11/09/2017   HGB 7.7 (L) 11/09/2017   HCT 22.6 (L) 11/09/2017   MCV 68.7 (L) 11/09/2017   PLT 353 11/09/2017   CMP     Component Value Date/Time   NA 137 06/28/2016 0519   K 3.9 06/28/2016 0519   CL 105 06/28/2016 0519   CO2 25 06/28/2016 0519   GLUCOSE 105 (H) 06/28/2016 0519   BUN 5 (L) 06/28/2016 0519   CREATININE 0.66 06/28/2016 0519   CALCIUM 9.1 06/28/2016 0519   PROT 7.1 06/24/2016 0647   ALBUMIN 1.8 (L) 06/24/2016 0647   AST 38 06/24/2016 0647   ALT 50 06/24/2016 0647   ALKPHOS 360 (H) 06/24/2016 0647   BILITOT 0.8 06/24/2016 0647   GFRNONAA >60 06/28/2016 0519   GFRAA >60 06/28/2016 0519      No results found for: TSH   RADIOGRAPHY:  As above     IMPRESSION/PLAN:  This is a delightful patient with head and neck cancer. I do recommend post op radiotherapy for this patient.  We discussed the potential risks, benefits, and side effects of radiotherapy. We talked in detail about acute and late effects. We discussed that some of the most bothersome acute effects may be mucositis, dysgeusia, salivary changes, skin irritation, hair loss, dehydration, weight loss and fatigue. We talked about late effects which include but are not necessarily limited to dysphagia, hypothyroidism, nerve injury, spinal cord injury, xerostomia, trismus,non healing soft tissue or bone injury, and neck edema. No guarantees of treatment were given. A consent form was signed and placed in the patient's medical record. The patient is enthusiastic about proceeding with treatment. I look forward to participating in the patient's care.    Simulation (treatment planning) will take place today  We also discussed that the treatment of head and  neck cancer is a multidisciplinary process to maximize treatment outcomes and quality of life. For this reason the following referrals have been or will be made:   Will refer to Nutritionist for nutrition support during and after treatment.  Continue Speech language pathology for swallowing and/or speech therapy.  Will refer to Social work for social support.   Continue Physical therapy due to risk of lymphedema in neck and deconditioning.   Baseline labs including TSH.  Of note I talked to Dr. Hendricks Limes and Dr. Desmond Dike in detail  about this patient.  The consensus is that while dental extractions before radiotherapy would reduce her risk of certain complications, it may also increase her risk of tumor repopulation before starting radiotherapy.  In the interest of starting radiotherapy in a timely manner, the patient will not undergo dental extractions.  I talked to Dr. Hendricks Limes about the most high risk areas that warrant targeting with radiotherapy.  I will do my best to reduce the morbidity of treatment through the radiotherapy design __________________________________________   Eppie Gibson, MD

## 2018-08-07 NOTE — Progress Notes (Signed)
Head and Neck Cancer Simulation, IMRT treatment planning note   Outpatient  Diagnosis:    ICD-10-CM   1. 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 An Aquaplast head and shoulder mask was custom fitted to the patient's anatomy. High-resolution CT axial imaging was obtained of the head and neck with contrast. I verified that the quality of the imaging is good for treatment planning. 1 Medically Necessary Treatment Device was fabricated and supervised by me: Aquaplast mask.  Treatment planning note I plan to treat the patient with IMRT to the oral cavity/neck. I plan to treat to a total dose of 60 Gray in 30  fractions. Dose calculation was ordered from dosimetry.  IMRT planning Note  IMRT is medically necessary and an important modality to deliver adequate dose to the patient's at risk tissues while sparing the patient's normal structures, including the: esophagus, parotid tissue, mandible, brain stem, spinal cord, oral cavity, brachial plexus.  This justifies the use of IMRT in the patient's treatment.    -----------------------------------  Eppie Gibson, MD

## 2018-08-07 NOTE — Progress Notes (Signed)
Oncology Nurse Navigator Documentation  Met with Angie Wright during initial consult with Dr. Isidore Moos.  She was accompanied by her husband and dtr.   . Further introduced myself as their Navigator, explained my role as a member of the Care Team.   . Provided New Patient Information packet, discussed contents: o Contact information for physician(s), myself, other members of the Care Team. o Advance Directive information ([place] blue pamphlet with LCSW contact info) o Fall Prevention Patient Safety Plan o Appointment Guideline o [place] [facility] [place] [facility] map with highlight of [place] o SLP information sheet o Symptom Management Clinic information . Provided introductory explanation of radiation treatment including SIM planning and purpose of Aquaplast head and shoulder mask, showed them example.   . She confirmed PEG placement during her 2/3 surgery.     I accompanied them to CT SIM.  She tolerated procedure without incident, denied conerns.  Showed them LINAC 1 treatment area, explained treatment registration and arrival procedures.  Escorted them to lobby, showed them kiosk / explained RT tmt registration procedure. They verbalized understanding of information provided.  I encouraged them to contact me with questions/concerns as treatments/procedures begin.   Gayleen Orem, RN, BSN Head & Neck Oncology Nurse [place] at [place] [phone]

## 2018-08-09 ENCOUNTER — Encounter: Payer: Self-pay | Admitting: Radiation Oncology

## 2018-08-09 ENCOUNTER — Other Ambulatory Visit: Payer: Self-pay | Admitting: Radiation Oncology

## 2018-08-09 DIAGNOSIS — R5383 Other fatigue: Principal | ICD-10-CM

## 2018-08-09 DIAGNOSIS — R5381 Other malaise: Secondary | ICD-10-CM

## 2018-08-09 DIAGNOSIS — Z1329 Encounter for screening for other suspected endocrine disorder: Secondary | ICD-10-CM

## 2018-08-09 DIAGNOSIS — C041 Malignant neoplasm of lateral floor of mouth: Secondary | ICD-10-CM

## 2018-08-15 ENCOUNTER — Telehealth: Payer: Self-pay | Admitting: *Deleted

## 2018-08-15 NOTE — Telephone Encounter (Signed)
[place] Work  Clinical Social Work was referred by radiation oncology for assessment of psychosocial needs.  Clinical Social Worker attempted contacted patient by phone  to offer support and assess for needs.  CSW left voicemail requesting patient return call.      Gwinda Maine, LCSW  Clinical Social Worker [facility] [place]

## 2018-08-16 DIAGNOSIS — Z510 Encounter for antineoplastic radiation therapy: Secondary | ICD-10-CM | POA: Diagnosis not present

## 2018-08-19 ENCOUNTER — Encounter: Payer: Self-pay | Admitting: *Deleted

## 2018-08-19 ENCOUNTER — Ambulatory Visit
Admission: RE | Admit: 2018-08-19 | Discharge: 2018-08-19 | Disposition: A | Discharge status: Home / Self Care | Payer: 59 | Source: Ambulatory Visit | Attending: Radiation Oncology | Admitting: Radiation Oncology

## 2018-08-19 DIAGNOSIS — C041 Malignant neoplasm of lateral floor of mouth: Secondary | ICD-10-CM

## 2018-08-19 DIAGNOSIS — Z510 Encounter for antineoplastic radiation therapy: Secondary | ICD-10-CM | POA: Diagnosis not present

## 2018-08-19 MED ORDER — SONAFINE EX EMUL: 1.0000 "application " | Freq: Two times a day (BID) | CUTANEOUS | Status: DC

## 2018-08-19 NOTE — Progress Notes (Signed)
Pt here for patient teaching.  Pt given Radiation and You booklet, skin care instructions and Sonafine.  Reviewed areas of pertinence such as fatigue, mouth changes, skin changes, throat changes and taste changes . Pt able to give teach back of to pat skin, use unscented/gentle soap and drink plenty of water,apply Sonafine bid and avoid applying anything to skin within 4 hours of treatment. Pt demonstrated understanding, needs reinforcement, no evidence of learning, refused teaching and  of information given and will contact nursing with any questions or concerns.     Http://rtanswers.org/treatmentinformation/whattoexpect/index

## 2018-08-19 NOTE — Progress Notes (Signed)
Oncology Nurse Navigator Documentation  To provide support, encouragement and care continuity, met with Ms. Angie Wright for her initial RT.  She was accompanied by her husband.  I reviewed the 2-step treatment process, answered questions.   Mr. Drier observed the treatment, expressed appreciation for the learning opportunity.  [name] completed treatment without difficulty, denied questions/concerns.  I reviewed the registration/arrival procedure for subsequent treatments.  I escorted them to PUT with Dr. Isidore Moos.  I encouraged them to call me with questions/concerns as tmts proceed.  Angie Orem, RN, BSN Head & Neck Oncology Nurse [place] at [place] [phone]

## 2018-08-20 ENCOUNTER — Other Ambulatory Visit: Payer: Self-pay

## 2018-08-20 ENCOUNTER — Ambulatory Visit
Admission: RE | Admit: 2018-08-20 | Discharge: 2018-08-20 | Disposition: A | Discharge status: Home / Self Care | Payer: 59 | Source: Ambulatory Visit | Attending: Radiation Oncology | Admitting: Radiation Oncology

## 2018-08-20 DIAGNOSIS — Z510 Encounter for antineoplastic radiation therapy: Secondary | ICD-10-CM | POA: Diagnosis not present

## 2018-08-21 ENCOUNTER — Ambulatory Visit
Admission: RE | Admit: 2018-08-21 | Discharge: 2018-08-21 | Disposition: A | Discharge status: Home / Self Care | Payer: 59 | Source: Ambulatory Visit | Attending: Radiation Oncology | Admitting: Radiation Oncology

## 2018-08-21 DIAGNOSIS — Z510 Encounter for antineoplastic radiation therapy: Secondary | ICD-10-CM | POA: Diagnosis not present

## 2018-08-22 ENCOUNTER — Ambulatory Visit
Admission: RE | Admit: 2018-08-22 | Discharge: 2018-08-22 | Disposition: A | Discharge status: Home / Self Care | Payer: 59 | Source: Ambulatory Visit | Attending: Radiation Oncology | Admitting: Radiation Oncology

## 2018-08-22 ENCOUNTER — Other Ambulatory Visit: Payer: Self-pay

## 2018-08-22 DIAGNOSIS — Z510 Encounter for antineoplastic radiation therapy: Secondary | ICD-10-CM | POA: Diagnosis not present

## 2018-08-23 ENCOUNTER — Ambulatory Visit
Admission: RE | Admit: 2018-08-23 | Discharge: 2018-08-23 | Disposition: A | Discharge status: Home / Self Care | Payer: 59 | Source: Ambulatory Visit | Attending: Radiation Oncology | Admitting: Radiation Oncology

## 2018-08-23 ENCOUNTER — Other Ambulatory Visit: Payer: Self-pay

## 2018-08-23 DIAGNOSIS — Z510 Encounter for antineoplastic radiation therapy: Secondary | ICD-10-CM | POA: Diagnosis not present

## 2018-08-26 ENCOUNTER — Other Ambulatory Visit: Payer: Self-pay | Admitting: Radiation Oncology

## 2018-08-26 ENCOUNTER — Ambulatory Visit
Admission: RE | Admit: 2018-08-26 | Discharge: 2018-08-26 | Disposition: A | Discharge status: Home / Self Care | Payer: 59 | Source: Ambulatory Visit | Attending: Radiation Oncology | Admitting: Radiation Oncology

## 2018-08-26 ENCOUNTER — Other Ambulatory Visit: Payer: Self-pay

## 2018-08-26 DIAGNOSIS — Z510 Encounter for antineoplastic radiation therapy: Secondary | ICD-10-CM | POA: Diagnosis not present

## 2018-08-26 DIAGNOSIS — C041 Malignant neoplasm of lateral floor of mouth: Secondary | ICD-10-CM

## 2018-08-26 MED ORDER — LIDOCAINE VISCOUS HCL 2 % MT SOLN: OROMUCOSAL | 5 refills | Status: AC

## 2018-08-27 ENCOUNTER — Ambulatory Visit
Admission: RE | Admit: 2018-08-27 | Discharge: 2018-08-27 | Disposition: A | Discharge status: Home / Self Care | Payer: 59 | Source: Ambulatory Visit | Attending: Radiation Oncology | Admitting: Radiation Oncology

## 2018-08-27 ENCOUNTER — Other Ambulatory Visit: Payer: Self-pay

## 2018-08-27 DIAGNOSIS — Z510 Encounter for antineoplastic radiation therapy: Secondary | ICD-10-CM | POA: Diagnosis not present

## 2018-08-28 ENCOUNTER — Other Ambulatory Visit: Payer: Self-pay

## 2018-08-28 ENCOUNTER — Ambulatory Visit
Admission: RE | Admit: 2018-08-28 | Discharge: 2018-08-28 | Disposition: A | Discharge status: Home / Self Care | Payer: 59 | Source: Ambulatory Visit | Attending: Radiation Oncology | Admitting: Radiation Oncology

## 2018-08-28 DIAGNOSIS — Z510 Encounter for antineoplastic radiation therapy: Secondary | ICD-10-CM | POA: Diagnosis not present

## 2018-08-29 ENCOUNTER — Ambulatory Visit
Admission: RE | Admit: 2018-08-29 | Discharge: 2018-08-29 | Disposition: A | Discharge status: Home / Self Care | Payer: 59 | Source: Ambulatory Visit | Attending: Radiation Oncology | Admitting: Radiation Oncology

## 2018-08-29 ENCOUNTER — Other Ambulatory Visit: Payer: Self-pay

## 2018-08-29 DIAGNOSIS — Z510 Encounter for antineoplastic radiation therapy: Secondary | ICD-10-CM | POA: Diagnosis not present

## 2018-08-30 ENCOUNTER — Ambulatory Visit
Admission: RE | Admit: 2018-08-30 | Discharge: 2018-08-30 | Disposition: A | Discharge status: Home / Self Care | Payer: 59 | Source: Ambulatory Visit | Attending: Radiation Oncology | Admitting: Radiation Oncology

## 2018-08-30 ENCOUNTER — Other Ambulatory Visit: Payer: Self-pay

## 2018-08-30 DIAGNOSIS — Z510 Encounter for antineoplastic radiation therapy: Secondary | ICD-10-CM | POA: Diagnosis not present

## 2018-09-02 ENCOUNTER — Encounter: Payer: Self-pay | Admitting: Nutrition

## 2018-09-02 ENCOUNTER — Ambulatory Visit
Admission: RE | Admit: 2018-09-02 | Discharge: 2018-09-02 | Disposition: A | Discharge status: Home / Self Care | Payer: 59 | Source: Ambulatory Visit | Attending: Radiation Oncology | Admitting: Radiation Oncology

## 2018-09-02 ENCOUNTER — Other Ambulatory Visit: Payer: Self-pay

## 2018-09-02 ENCOUNTER — Inpatient Hospital Stay: Payer: 59 | Attending: Radiation Oncology | Admitting: Nutrition

## 2018-09-02 DIAGNOSIS — Z510 Encounter for antineoplastic radiation therapy: Secondary | ICD-10-CM | POA: Diagnosis not present

## 2018-09-02 NOTE — Progress Notes (Signed)
RD working remotely.  63 yo female diagnosed with Floor of Mouth cancer, p16 + receiving 60 gy in 30 fxns. She is a patient of Dr. Isidore Moos.  PMH includes sickle cell trait and anemia.  Medications include Folvite and Lidocaine.  Labs were reviewed.  Ht: 67 inches Weight: 125 pounds. Weight in June 2019: 167 pounds. BMI: 19.6  Contacted patient and spoke with her and her husband. She is tolerating 5 bottles Osmolite 1.5 with 60 mL free water before and after bolus 5 times daily. She gives 30 mL Promod once daily. She gives an additional 480 mL free water daily. Denies nausea, vomiting, constipation, and diarrhea. Reports weight has improved to 125 pounds today measured in XRT from 118 pounds March 4. Husband states he is going to reorder supplies and TF today.  Estimated Nutrition Needs: 1700 - 2000 kcal, 85-105 grams protein, 2.0 L fluid.  Nutrition Diagnosis: Unintended weight loss related to floor of mouth cancer as evidenced by 25% weight loss over 9 months.  Intervention: Educated to continue 5 bottles Osmolite 1.5 with 30 mL Promod daily.  TF + Promod and free water provides 1875 kcal, 104.5 grams Protein, 1985 mL free water meeting estimated nutrition needs. Encouraged patient and husband to contact me with questions or concerns.  Monitoring, Evaluation, Goals: Patient will tolerate TF and Promod to meet estimated nutrition needs for weight maintenance and healing.  Next Visit: Will contact patient weekly. They have a contact number in case of questions. Monday, April 1 by phone.

## 2018-09-03 ENCOUNTER — Ambulatory Visit
Admission: RE | Admit: 2018-09-03 | Discharge: 2018-09-03 | Disposition: A | Discharge status: Home / Self Care | Payer: 59 | Source: Ambulatory Visit | Attending: Radiation Oncology | Admitting: Radiation Oncology

## 2018-09-03 ENCOUNTER — Telehealth: Payer: Self-pay | Admitting: *Deleted

## 2018-09-03 ENCOUNTER — Other Ambulatory Visit: Payer: Self-pay

## 2018-09-03 DIAGNOSIS — Z510 Encounter for antineoplastic radiation therapy: Secondary | ICD-10-CM | POA: Diagnosis not present

## 2018-09-03 NOTE — Telephone Encounter (Signed)
On 09-03-18 fax medical records to [facility] absence manag. It was consult note, sim and planning note.

## 2018-09-04 ENCOUNTER — Ambulatory Visit
Admission: RE | Admit: 2018-09-04 | Discharge: 2018-09-04 | Disposition: A | Discharge status: Home / Self Care | Payer: 59 | Source: Ambulatory Visit | Attending: Radiation Oncology | Admitting: Radiation Oncology

## 2018-09-04 ENCOUNTER — Other Ambulatory Visit: Payer: Self-pay

## 2018-09-04 DIAGNOSIS — C041 Malignant neoplasm of lateral floor of mouth: Secondary | ICD-10-CM | POA: Diagnosis not present

## 2018-09-04 DIAGNOSIS — Z510 Encounter for antineoplastic radiation therapy: Secondary | ICD-10-CM | POA: Insufficient documentation

## 2018-09-05 ENCOUNTER — Ambulatory Visit
Admission: RE | Admit: 2018-09-05 | Discharge: 2018-09-05 | Disposition: A | Discharge status: Home / Self Care | Payer: 59 | Source: Ambulatory Visit | Attending: Radiation Oncology | Admitting: Radiation Oncology

## 2018-09-05 ENCOUNTER — Other Ambulatory Visit: Payer: Self-pay

## 2018-09-05 DIAGNOSIS — Z510 Encounter for antineoplastic radiation therapy: Secondary | ICD-10-CM | POA: Diagnosis not present

## 2018-09-06 ENCOUNTER — Ambulatory Visit
Admission: RE | Admit: 2018-09-06 | Discharge: 2018-09-06 | Disposition: A | Discharge status: Home / Self Care | Payer: 59 | Source: Ambulatory Visit | Attending: Radiation Oncology | Admitting: Radiation Oncology

## 2018-09-06 ENCOUNTER — Other Ambulatory Visit: Payer: Self-pay

## 2018-09-06 DIAGNOSIS — Z510 Encounter for antineoplastic radiation therapy: Secondary | ICD-10-CM | POA: Diagnosis not present

## 2018-09-09 ENCOUNTER — Other Ambulatory Visit: Payer: Self-pay

## 2018-09-09 ENCOUNTER — Inpatient Hospital Stay: Payer: 59 | Attending: Radiation Oncology | Admitting: Nutrition

## 2018-09-09 ENCOUNTER — Ambulatory Visit
Admission: RE | Admit: 2018-09-09 | Discharge: 2018-09-09 | Disposition: A | Discharge status: Home / Self Care | Payer: 59 | Source: Ambulatory Visit | Attending: Radiation Oncology | Admitting: Radiation Oncology

## 2018-09-09 DIAGNOSIS — Z510 Encounter for antineoplastic radiation therapy: Secondary | ICD-10-CM | POA: Diagnosis not present

## 2018-09-09 NOTE — Progress Notes (Signed)
RD working remotely.  Patient receiving radiation for floor of mouth cancer. Contacted patient who reports she weighed 125 pounds this morning at MD visit. Denies N, V, D, or C. States she is tolerating 5 bottles of Osmolite 1.5 daily with 30 mL Promod once daily.  Estimated Nutrition Needs: 1700 - 2000 kcal, 85-105 grams protein, 2 L fluid  Nutrition Diagnosis: Unintended weight loss stable.  Intervention: Educated patient to continue Osmolite 1.5 and Promod as directed. TF + Promod and free water provides 1875 kcal, 104.5 grams protein, 1985 mL free water.  Monitoring, Evaluation, Goals: Patient will continue to tolerate TF and Promod to meet estimated nutrition needs for weight maintenance.  Next Visit: Monday, April 13, by telephone.

## 2018-09-10 ENCOUNTER — Other Ambulatory Visit: Payer: Self-pay

## 2018-09-10 ENCOUNTER — Ambulatory Visit
Admission: RE | Admit: 2018-09-10 | Discharge: 2018-09-10 | Disposition: A | Discharge status: Home / Self Care | Payer: 59 | Source: Ambulatory Visit | Attending: Radiation Oncology | Admitting: Radiation Oncology

## 2018-09-10 DIAGNOSIS — Z510 Encounter for antineoplastic radiation therapy: Secondary | ICD-10-CM | POA: Diagnosis not present

## 2018-09-11 ENCOUNTER — Other Ambulatory Visit: Payer: Self-pay

## 2018-09-11 ENCOUNTER — Ambulatory Visit
Admission: RE | Admit: 2018-09-11 | Discharge: 2018-09-11 | Disposition: A | Discharge status: Home / Self Care | Payer: 59 | Source: Ambulatory Visit | Attending: Radiation Oncology | Admitting: Radiation Oncology

## 2018-09-11 DIAGNOSIS — Z510 Encounter for antineoplastic radiation therapy: Secondary | ICD-10-CM | POA: Diagnosis not present

## 2018-09-12 ENCOUNTER — Ambulatory Visit
Admission: RE | Admit: 2018-09-12 | Discharge: 2018-09-12 | Disposition: A | Discharge status: Home / Self Care | Payer: 59 | Source: Ambulatory Visit | Attending: Radiation Oncology | Admitting: Radiation Oncology

## 2018-09-12 ENCOUNTER — Other Ambulatory Visit: Payer: Self-pay

## 2018-09-12 DIAGNOSIS — Z510 Encounter for antineoplastic radiation therapy: Secondary | ICD-10-CM | POA: Diagnosis not present

## 2018-09-13 ENCOUNTER — Other Ambulatory Visit: Payer: Self-pay

## 2018-09-13 ENCOUNTER — Ambulatory Visit
Admission: RE | Admit: 2018-09-13 | Discharge: 2018-09-13 | Disposition: A | Discharge status: Home / Self Care | Payer: 59 | Source: Ambulatory Visit | Attending: Radiation Oncology | Admitting: Radiation Oncology

## 2018-09-13 DIAGNOSIS — Z510 Encounter for antineoplastic radiation therapy: Secondary | ICD-10-CM | POA: Diagnosis not present

## 2018-09-16 ENCOUNTER — Other Ambulatory Visit: Payer: Self-pay

## 2018-09-16 ENCOUNTER — Ambulatory Visit
Admission: RE | Admit: 2018-09-16 | Discharge: 2018-09-16 | Disposition: A | Discharge status: Home / Self Care | Payer: 59 | Source: Ambulatory Visit | Attending: Radiation Oncology | Admitting: Radiation Oncology

## 2018-09-16 ENCOUNTER — Inpatient Hospital Stay: Payer: 59 | Admitting: Nutrition

## 2018-09-16 DIAGNOSIS — Z510 Encounter for antineoplastic radiation therapy: Secondary | ICD-10-CM | POA: Diagnosis not present

## 2018-09-16 NOTE — Progress Notes (Signed)
RD working remotely.  Nutrition follow up completed with patient receiving radiation treatment for floor of mouth cancer. Patient reports she has not weighed this week. Weight was 125 pounds last week. Continues 5 bottles of Osmolite 1.5 daily with 30 mL promod once daily. States she feels well and denies N, V, C, or D. Denies problems with feeding tube. She has enough supplies.  Estimated nutrition needs: 1700-2000 kcal, 85-105 grams protein, 2 L fluid.  Nutrition Diagnosis: Unintended weight loss cannot be evaluated.  Intervention: Continue TF and Promod providing 1875 kcal, 104.5 grams protein and 1985 mL free water.  Monitoring, Evaluation, Goals: Will monitor for tolerance and wt loss.  Next visit: Monday, April 20.

## 2018-09-17 ENCOUNTER — Other Ambulatory Visit: Payer: Self-pay

## 2018-09-17 ENCOUNTER — Ambulatory Visit
Admission: RE | Admit: 2018-09-17 | Discharge: 2018-09-17 | Disposition: A | Discharge status: Home / Self Care | Payer: 59 | Source: Ambulatory Visit | Attending: Radiation Oncology | Admitting: Radiation Oncology

## 2018-09-17 DIAGNOSIS — Z510 Encounter for antineoplastic radiation therapy: Secondary | ICD-10-CM | POA: Diagnosis not present

## 2018-09-18 ENCOUNTER — Other Ambulatory Visit: Payer: Self-pay

## 2018-09-18 ENCOUNTER — Ambulatory Visit
Admission: RE | Admit: 2018-09-18 | Discharge: 2018-09-18 | Disposition: A | Discharge status: Home / Self Care | Payer: 59 | Source: Ambulatory Visit | Attending: Radiation Oncology | Admitting: Radiation Oncology

## 2018-09-18 DIAGNOSIS — Z510 Encounter for antineoplastic radiation therapy: Secondary | ICD-10-CM | POA: Diagnosis not present

## 2018-09-19 ENCOUNTER — Ambulatory Visit
Admission: RE | Admit: 2018-09-19 | Discharge: 2018-09-19 | Disposition: A | Discharge status: Home / Self Care | Payer: 59 | Source: Ambulatory Visit | Attending: Radiation Oncology | Admitting: Radiation Oncology

## 2018-09-19 ENCOUNTER — Other Ambulatory Visit: Payer: Self-pay

## 2018-09-19 DIAGNOSIS — Z510 Encounter for antineoplastic radiation therapy: Secondary | ICD-10-CM | POA: Diagnosis not present

## 2018-09-20 ENCOUNTER — Ambulatory Visit
Admission: RE | Admit: 2018-09-20 | Discharge: 2018-09-20 | Disposition: A | Discharge status: Home / Self Care | Payer: 59 | Source: Ambulatory Visit | Attending: Radiation Oncology | Admitting: Radiation Oncology

## 2018-09-20 ENCOUNTER — Other Ambulatory Visit: Payer: Self-pay

## 2018-09-20 DIAGNOSIS — Z510 Encounter for antineoplastic radiation therapy: Secondary | ICD-10-CM | POA: Diagnosis not present

## 2018-09-23 ENCOUNTER — Inpatient Hospital Stay: Payer: 59 | Admitting: Nutrition

## 2018-09-23 ENCOUNTER — Ambulatory Visit
Admission: RE | Admit: 2018-09-23 | Discharge: 2018-09-23 | Disposition: A | Discharge status: Home / Self Care | Payer: 59 | Source: Ambulatory Visit | Attending: Radiation Oncology | Admitting: Radiation Oncology

## 2018-09-23 ENCOUNTER — Other Ambulatory Visit: Payer: Self-pay

## 2018-09-23 DIAGNOSIS — Z510 Encounter for antineoplastic radiation therapy: Secondary | ICD-10-CM | POA: Diagnosis not present

## 2018-09-23 NOTE — Progress Notes (Signed)
RD working remotely.  Nutrition follow up scheduled but could not be completed. Contacted patient and left message with my contact information for her to return call. She finishes radiation treatment this Friday, April 24.

## 2018-09-24 ENCOUNTER — Other Ambulatory Visit: Payer: Self-pay

## 2018-09-24 ENCOUNTER — Ambulatory Visit
Admission: RE | Admit: 2018-09-24 | Discharge: 2018-09-24 | Disposition: A | Discharge status: Home / Self Care | Payer: 59 | Source: Ambulatory Visit | Attending: Radiation Oncology | Admitting: Radiation Oncology

## 2018-09-24 DIAGNOSIS — Z510 Encounter for antineoplastic radiation therapy: Secondary | ICD-10-CM | POA: Diagnosis not present

## 2018-09-25 ENCOUNTER — Ambulatory Visit
Admission: RE | Admit: 2018-09-25 | Discharge: 2018-09-25 | Disposition: A | Discharge status: Home / Self Care | Payer: 59 | Source: Ambulatory Visit | Attending: Radiation Oncology | Admitting: Radiation Oncology

## 2018-09-25 ENCOUNTER — Other Ambulatory Visit: Payer: Self-pay

## 2018-09-25 ENCOUNTER — Telehealth: Payer: Self-pay | Admitting: *Deleted

## 2018-09-25 DIAGNOSIS — Z510 Encounter for antineoplastic radiation therapy: Secondary | ICD-10-CM | POA: Diagnosis not present

## 2018-09-25 NOTE — Telephone Encounter (Signed)
Oncology Nurse Navigator Documentation  Received e-mail from [place], [facility], confirming Angie Wright has post-RT follow-up with Dr. Hendricks Limes [facility] 4/29 1:30.  Dr. Isidore Moos informed.  Gayleen Orem, RN, BSN Head & Neck Oncology Nurse [place] at [place] [phone]

## 2018-09-26 ENCOUNTER — Ambulatory Visit
Admission: RE | Admit: 2018-09-26 | Discharge: 2018-09-26 | Disposition: A | Discharge status: Home / Self Care | Payer: 59 | Source: Ambulatory Visit | Attending: Radiation Oncology | Admitting: Radiation Oncology

## 2018-09-26 ENCOUNTER — Other Ambulatory Visit: Payer: Self-pay

## 2018-09-26 DIAGNOSIS — Z510 Encounter for antineoplastic radiation therapy: Secondary | ICD-10-CM | POA: Diagnosis not present

## 2018-09-27 ENCOUNTER — Ambulatory Visit
Admission: RE | Admit: 2018-09-27 | Discharge: 2018-09-27 | Disposition: A | Discharge status: Home / Self Care | Payer: 59 | Source: Ambulatory Visit | Attending: Radiation Oncology | Admitting: Radiation Oncology

## 2018-09-27 ENCOUNTER — Encounter: Payer: Self-pay | Admitting: Radiation Oncology

## 2018-09-27 ENCOUNTER — Other Ambulatory Visit: Payer: Self-pay

## 2018-09-27 DIAGNOSIS — Z510 Encounter for antineoplastic radiation therapy: Secondary | ICD-10-CM | POA: Diagnosis not present

## 2018-09-30 ENCOUNTER — Ambulatory Visit: Payer: 59

## 2018-10-01 ENCOUNTER — Ambulatory Visit: Payer: 59

## 2018-10-02 ENCOUNTER — Ambulatory Visit: Payer: 59

## 2018-10-03 ENCOUNTER — Ambulatory Visit: Payer: 59

## 2018-10-04 ENCOUNTER — Ambulatory Visit: Payer: 59

## 2018-10-07 ENCOUNTER — Ambulatory Visit: Payer: 59

## 2018-10-08 ENCOUNTER — Ambulatory Visit: Payer: 59

## 2018-10-09 ENCOUNTER — Ambulatory Visit: Payer: 59

## 2018-10-15 ENCOUNTER — Encounter: Payer: Self-pay | Admitting: Radiation Oncology

## 2018-10-15 ENCOUNTER — Other Ambulatory Visit: Payer: Self-pay

## 2018-10-15 ENCOUNTER — Ambulatory Visit
Admission: RE | Admit: 2018-10-15 | Discharge: 2018-10-15 | Disposition: A | Discharge status: Home / Self Care | Payer: 59 | Source: Ambulatory Visit | Attending: Radiation Oncology | Admitting: Radiation Oncology

## 2018-10-15 DIAGNOSIS — Z79899 Other long term (current) drug therapy: Secondary | ICD-10-CM | POA: Diagnosis not present

## 2018-10-15 DIAGNOSIS — Z923 Personal history of irradiation: Secondary | ICD-10-CM | POA: Insufficient documentation

## 2018-10-15 DIAGNOSIS — Z7901 Long term (current) use of anticoagulants: Secondary | ICD-10-CM | POA: Diagnosis not present

## 2018-10-15 DIAGNOSIS — C041 Malignant neoplasm of lateral floor of mouth: Secondary | ICD-10-CM | POA: Diagnosis not present

## 2018-10-15 HISTORY — DX: Personal history of irradiation: Z92.3

## 2018-10-15 NOTE — Progress Notes (Signed)
  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 She voiced no pain. Tube feedings going well, using it five times per day. She did develop significant fatigue and thick saliva as she progressed through treatment. She continues to have rough mucosa where the tissue abuts her anterior mandibular teeth. The skin to her radiation site shows hyperpigmentation changes but is intact with no moist desquamation. She is using Sonafine twice daily as directed.  Plan: The patient will follow-up with radiation oncology in 2 weeks. Gayleen Orem, RN will arrange follow-up for the patient in 1-2 weeks with Dr. Hendricks Limes.  ________________________________________________  Eppie Gibson, MD  This document serves as a record of services personally performed by Eppie Gibson, MD. It was created on her behalf by Rae Lips, a trained medical scribe. The creation of this record is based on the scribe's personal observations and the provider's statements to them. This document has been checked and approved by the attending provider.

## 2018-10-15 NOTE — Patient Instructions (Signed)
Coronavirus (COVID-19) Are you at risk?  Are you at risk for the Coronavirus (COVID-19)?  To be considered HIGH RISK for Coronavirus (COVID-19), you have to meet the following criteria:  . Traveled to [place], [place], [place], [place] or [place]; or in the [place] to [place], [place], [place], or [place]; and have fever, cough, and shortness of breath within the last 2 weeks of travel OR . Been in close contact with a person diagnosed with COVID-19 within the last 2 weeks and have fever, cough, and shortness of breath . IF YOU DO NOT MEET THESE CRITERIA, YOU ARE CONSIDERED LOW RISK FOR COVID-19.  What to do if you are HIGH RISK for COVID-19?  . If you are having a medical emergency, call 911. . Seek medical care right away. Before you go to a doctor's office, urgent care or emergency department, call ahead and tell them about your recent travel, contact with someone diagnosed with COVID-19, and your symptoms. You should receive instructions from your physician's office regarding next steps of care.  . When you arrive at healthcare provider, tell the healthcare staff immediately you have returned from visiting [place], [place], [place], [place] or [place]; or traveled in the [place] to [place], [place], [place], or [place]; in the last two weeks or you have been in close contact with a person diagnosed with COVID-19 in the last 2 weeks.   . Tell the health care staff about your symptoms: fever, cough and shortness of breath. . After you have been seen by a medical provider, you will be either: o Tested for (COVID-19) and discharged home on quarantine except to seek medical care if symptoms worsen, and asked to  - Stay home and avoid contact with others until you get your results (4-5 days)  - Avoid travel on public transportation if possible (such as bus, train, or airplane) or o Sent to the Emergency Department by EMS for evaluation, COVID-19 testing, and possible  admission depending on your condition and test results.  What to do if you are LOW RISK for COVID-19?  Reduce your risk of any infection by using the same precautions used for avoiding the common cold or flu:  . Wash your hands often with soap and warm water for at least 20 seconds.  If soap and water are not readily available, use an alcohol-based hand sanitizer with at least 60% alcohol.  . If coughing or sneezing, cover your mouth and nose by coughing or sneezing into the elbow areas of your shirt or coat, into a tissue or into your sleeve (not your hands). . Avoid shaking hands with others and consider head nods or verbal greetings only. . Avoid touching your eyes, nose, or mouth with unwashed hands.  . Avoid close contact with people who are sick. . Avoid places or events with large numbers of people in one location, like concerts or sporting events. . Carefully consider travel plans you have or are making. . If you are planning any travel outside or inside the [place], visit the CDC's Travelers' Health webpage for the latest health notices. . If you have some symptoms but not all symptoms, continue to monitor at home and seek medical attention if your symptoms worsen. . If you are having a medical emergency, call 911.   ADDITIONAL HEALTHCARE OPTIONS FOR PATIENTS  [facility] / e-Visit: [email or web address]         [facility]: [phone]  [place]   [facility]: [phone]                   [facility]: [phone]   

## 2018-10-15 NOTE — Progress Notes (Signed)
Radiation Oncology         [phone] ________________________________  Name: Angie Wright MRN: [id number]  Date: 10/15/2018  DOB: Apr 01, 1956  Follow-Up Visit Note  CC: Maurice Small, MD  Magister, Beverely Low, MD  Diagnosis and Prior Radiotherapy:       ICD-10-CM   1. 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  She was last seen by Dr. Hendricks Limes on 10/09/2018 and when he evaluated her he felt that the erosion of tissue near her mandibular teeth could be observed - no clear stated concern for recurrence - eventually, she'll undergo revision of the flap.  She reports sharp pains below her chin area that come and go.  Using a feeding tube?: Yes, she is instilling 5 nutritional supplements daily.  Weight changes, if any:  Wt Readings from Last 3 Encounters:  10/15/18 127 lb (57.6 kg)  08/07/18 118 lb (53.5 kg)  11/09/17 167 lb (75.8 kg)   Swallowing issues, if any: She is swallowing water by mouth. She will see a swallowing therapist next month at [facility] Smoking or chewing tobacco? No Using fluoride trays daily? N/A, She is using fluoride toothpaste. Has seen Dr Desmond Dike of dentistry at [facility] in the past. Last ENT visit was on: She will see Dr. Hendricks Limes on 11/13/18, and Speech Therapy as well the same day.  Other notable issues, if any:  Her skin is hyperpigmented. She continues to use sonafine.              ALLERGIES:  has No Known Allergies.  Meds: Current Outpatient Medications   Medication Sig Dispense Refill  . acetaminophen (TYLENOL) 500 MG tablet Take 1,000 mg by mouth every 6 (six) hours as needed for headache (pain).    . DENTA 5000 PLUS 1.1 % CREA dental cream APPLY A THIN RIBBON TO TOOTHBRUSH,BRUSH THOROUGHLY TWICE DAILY MORNING & EVENING FOR 2 MINS    . ELIQUIS 5 MG TABS tablet Take 5 mg by mouth 2 (two) times daily.    . folic acid (FOLVITE) 1 MG tablet Take 1 mg by mouth daily.    Marland Kitchen KLOR-CON M10 10 MEQ tablet TAKE 2 TABLETS (20 MEQ TOTAL) BY MOUTH 2 TIMES DAILY    . enoxaparin (LOVENOX) 60 MG/0.6ML injection INJECT 0.5 MLS (50 MG TOTAL) INTO THE SKIN EVERY 12 HOURS FOR 30 DAYS.    Marland Kitchen lidocaine (XYLOCAINE) 2 % solution Patient: Mix 1part 2% viscous lidocaine, 1part H20. Swish & swallow 66mL of diluted mixture up to QID PRN soreness (Patient not taking: Reported on 10/15/2018) 150 mL 5  . oxyCODONE (OXY IR/ROXICODONE) 5 MG immediate release tablet      No current facility-administered medications for this encounter.     Physical Findings: The patient is in no acute distress. Patient is alert and oriented. Wt Readings from Last 3 Encounters:  10/15/18 127 lb (57.6 kg)  08/07/18 118 lb (53.5 kg)  11/09/17 167 lb (75.8 kg)  height is 5\' 7"  (1.702 m) and weight is 127 lb (57.6 kg). Her oral temperature is 98.5 F (36.9 C). Her blood pressure is 128/62 and her pulse is 96. Her respiration is 20 and oxygen saturation is 100%. .  General: Alert and oriented, in no acute distress HEENT: Head is normocephalic. Extraocular movements are intact. Oropharynx is notable for stable tissue erosion at anterior left tissue flap adjacent to mandibular teeth; no thrush Neck: Neck is notable for no obvious masses, lymphedema in right chin/jaw Skin: Skin in treatment fields shows satisfactory healing - intact Lymphatics: see Neck Exam Psychiatric: Judgment and insight are intact. Affect is appropriate.   Lab Findings: Lab Results  Component Value Date   WBC 8.9  11/09/2017   HGB 7.7 (L) 11/09/2017   HCT 22.6 (L) 11/09/2017   MCV 68.7 (L) 11/09/2017   PLT 353 11/09/2017    No results found for: TSH  Radiographic Findings: No results found.  Impression/Plan:    1) Head and Neck Cancer Status: healing well from RT  2) Nutritional Status: continue tube feeds and SLP  PEG tube: yes  3) Risk Factors: The patient has been educated about risk factors including alcohol and tobacco abuse; they understand that avoidance of alcohol and tobacco is important to prevent recurrences as well as other cancers  4) Swallowing: as tolerated, continue SLP  5) Dental: Encouraged to continue regular followup with dentistry, and dental hygiene including fluoride treatment per Dr Desmond Dike  6) Thyroid function: No results found for: TSH  7) Other:  Follow-up in 1 months with ENT and undergo eventual revision of tissue flap. I  contacted Dr Hendricks Limes and the [facility] team to verify that she will undergo restaging scans at [facility] since her baseline scans were done there.  I'll see her back her in September. The patient was encouraged to call with any issues or questions before then.  Sharp pains below her chin area that come and go - likely reactivated nerve endings - continue to follow.  I spent 15 minutes face to face with the patient and more than 50% of that time was spent in counseling and/or coordination of care. _____________________________________   Eppie Gibson, MD  This document serves as a record of services personally performed by Eppie Gibson, MD. It was created on her behalf by Rae Lips, a trained medical scribe. The creation of this record is based on the scribe's personal observations and the provider's statements to them. This document has been checked and approved by the attending provider.

## 2018-10-15 NOTE — Progress Notes (Signed)
Angie Wright presents for follow up of radiation completed 09/27/18 to her head and neck/ floor of mouth.   Pain issues, if any: She reports sharp pains below her chin area.  Using a feeding tube?: Yes, she is instilling 5 nutritional supplements daily.  Weight changes, if any:  Wt Readings from Last 3 Encounters:  10/15/18 127 lb (57.6 kg)  08/07/18 118 lb (53.5 kg)  11/09/17 167 lb (75.8 kg)   Swallowing issues, if any: She is swallowing water by mouth. She will see a swallowing therapist next month at [facility] Smoking or chewing tobacco? No Using fluoride trays daily? N/A, She is using fluoride toothpaste.  Last ENT visit was on: She will see Dr. Hendricks Limes on 11/13/18, and Speech Therapy as well the same day.  Other notable issues, if any:   Her skin is hyperpigmented. She continues to use sonafine.   BP 128/62 (BP Location: Right Arm, Patient Position: Sitting)   Pulse 96   Temp 98.5 F (36.9 C) (Oral)   Resp 20   Ht 5\' 7"  (1.702 m)   Wt 127 lb (57.6 kg)   SpO2 100%   BMI 19.89 kg/m

## 2018-10-21 ENCOUNTER — Other Ambulatory Visit: Payer: Self-pay | Admitting: Radiation Oncology

## 2018-10-21 ENCOUNTER — Telehealth: Payer: Self-pay | Admitting: *Deleted

## 2018-10-21 ENCOUNTER — Encounter: Payer: Self-pay | Admitting: Radiation Oncology

## 2018-10-21 DIAGNOSIS — Z1329 Encounter for screening for other suspected endocrine disorder: Secondary | ICD-10-CM

## 2018-10-21 DIAGNOSIS — R634 Abnormal weight loss: Secondary | ICD-10-CM

## 2018-10-21 NOTE — Telephone Encounter (Signed)
Called patient to inform of lab and fu appt. On 02-21-19, spoke with patient's husband- Iona Beard and he is aware of these appts.

## 2018-11-19 ENCOUNTER — Telehealth: Payer: Self-pay | Admitting: Nutrition

## 2018-11-19 NOTE — Telephone Encounter (Signed)
Left voicemail to confirm appt and verify info. °

## 2018-11-20 ENCOUNTER — Inpatient Hospital Stay: Payer: 59 | Attending: Radiation Oncology | Admitting: Nutrition

## 2018-11-20 NOTE — Progress Notes (Signed)
RD working remotely.  Contacted patient who was unavailable. Left message and phone number for return call.

## 2018-11-26 ENCOUNTER — Ambulatory Visit: Payer: 59 | Admitting: Nutrition

## 2018-11-26 NOTE — Progress Notes (Signed)
RD working remotely.  Nutrition follow up completed with patient who completed radiation treatment on April 24 for floor of mouth cancer. Reports she is doing well. Has started milkshakes, water, and chicken broth by mouth. Continues 5 bottles Osmolite 1.5 via tube without difficulty. Reports weight increased to 129 pounds. She denies needs or questions.  Unintended weight loss resolved. No further interventions required at this time. She has my contact information.

## 2019-01-15 ENCOUNTER — Inpatient Hospital Stay: Payer: 59 | Attending: Radiation Oncology | Admitting: Nutrition

## 2019-01-15 NOTE — Progress Notes (Signed)
Nutrition follow-up completed with patient over the telephone. Patient completed radiation treatment on April 24 for floor of mouth cancer. She reports she has been able to increase her oral intake and is tolerating mashed potatoes, macaroni and cheese, pudding, and Ensure Plus by mouth. She continues to use 4 bottles Osmolite 1.5 via PEG. Reports her weight has improved to 135 pounds on home scale. Patient is hoping to have feeding tube removed soon.  I educated patient to increase oral intake throughout the day.  Encourage soft smooth foods. Recommended patient substitute 1 bottle Ensure Plus by mouth and reduce 1 bottle Osmolite 1.5 until she is tolerating all oral intake. Stressed importance of weight maintenance. Patient reports understanding. She has my contact information for questions.  **Disclaimer: This note was dictated with voice recognition software. Similar sounding words can inadvertently be transcribed and this note may contain transcription errors which may not have been corrected upon publication of note.**

## 2019-02-15 ENCOUNTER — Encounter: Payer: Self-pay | Admitting: *Deleted

## 2019-02-15 NOTE — Progress Notes (Signed)
Oncology Nurse Navigator Documentation  Flowsheet/spreadsheet update.  Rick Diehl, RN, BSN Head & Neck Oncology Nurse Navigator [facility] [phone]   

## 2019-02-19 NOTE — Progress Notes (Signed)
I called the patient today about their upcoming follow-up appointment in radiation oncology.   Given concerns about the COVID-19 pandemic, I offered a phone assessment with the patient to determine if coming to the clinic was necessary. The patient accepted.  I let the patient know that I had spoken with Dr. Isidore Moos, and she wanted them to know the importance of washing their hands for at least 20 seconds at a time, especially after going out in public, and before they eat. Limit going out in public whenever possible. Do not touch your face, unless your hands are clean, such as when bathing. Get plenty of rest, eat well, and stay hydrated.   Symptomatically, the patient is doing relatively well. She is currently being treated by Medical Oncology at [facility] for therapy related to progressive cancer. I spoke with her husband today and he reports no concerns related to her radiation treatment. He is agreeable to cancel her appointment with Dr. Isidore Moos.   All questions were answered to the patient's satisfaction.  I encouraged the patient to call with any further questions. Otherwise, the plan is follow up as needed.    Patient is pleased with this plan, and we will cancel their upcoming follow-up to reduce the risk of COVID-19 transmission.

## 2019-02-21 ENCOUNTER — Ambulatory Visit: Payer: 59

## 2019-02-21 ENCOUNTER — Ambulatory Visit
Admission: RE | Admit: 2019-02-21 | Discharge: 2019-02-21 | Disposition: A | Discharge status: Home / Self Care | Payer: 59 | Source: Ambulatory Visit | Attending: Radiation Oncology | Admitting: Radiation Oncology

## 2019-02-27 ENCOUNTER — Inpatient Hospital Stay: Payer: 59 | Attending: Radiation Oncology | Admitting: Nutrition

## 2019-02-27 NOTE — Progress Notes (Signed)
Contacted patient by phone for follow-up.  Patient was unavailable.  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 Encourage patient to contact dietitian at [facility] for any issues related to current treatment.  Patient has my contact information for questions or concerns related to radiation.  **Disclaimer: This note was dictated with voice recognition software. Similar sounding words can inadvertently be transcribed and this note may contain transcription errors which may not have been corrected upon publication of note.**

## 2019-03-03 IMAGING — CT CT NECK W/ CM
3 of 4 series · 13 of 33 positions shown, 16 images · IV contrast (omnipaque)
Comparison: None.

CLINICAL DATA: Right lateral tongue ulcerative lesion status post
recent biopsy.

EXAM:
CT NECK WITH CONTRAST
TECHNIQUE: Multidetector CT imaging of the neck was performed using the
standard protocol following the bolus administration of intravenous
contrast.
CONTRAST:  75mL OMNIPAQUE IOHEXOL 300 MG/ML  SOLN

[Series 3: axial neck · axial · 0.34mm/px · z∈[+1126,+1246]mm · 5 of 92 slices shown, 7 images]
[im 16/92  soft-tissue]
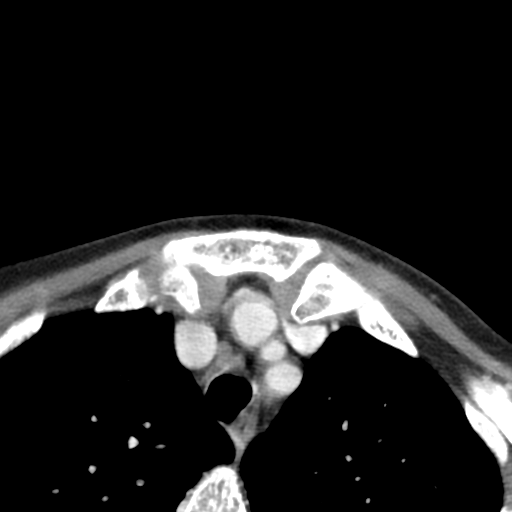
[im 16/92  bone]
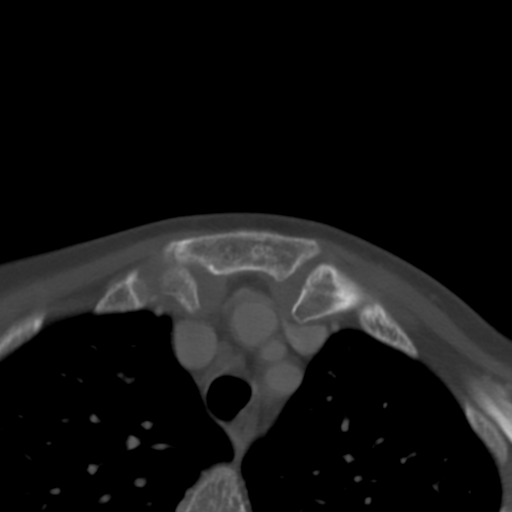
[im 31/92  bone]
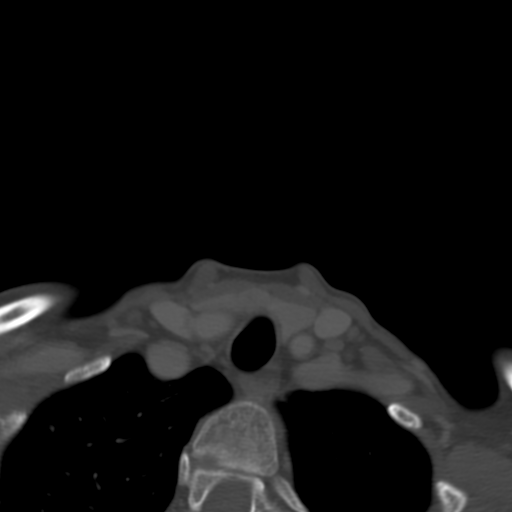
[im 46/92  bone]
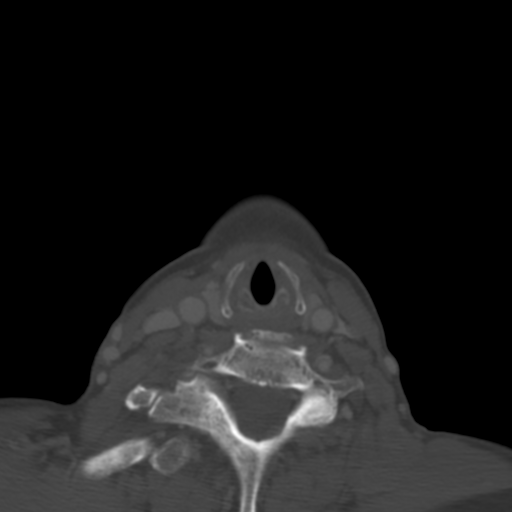
[im 61/92  bone]
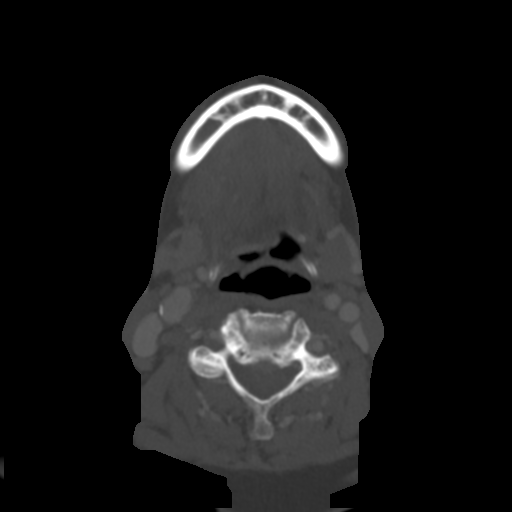
[im 76/92  soft-tissue]
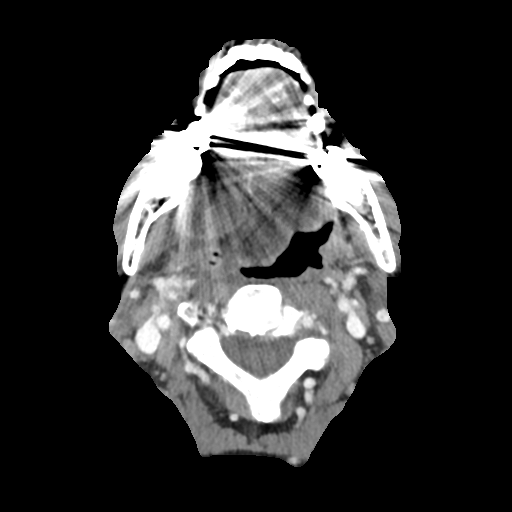
[im 76/92  bone]
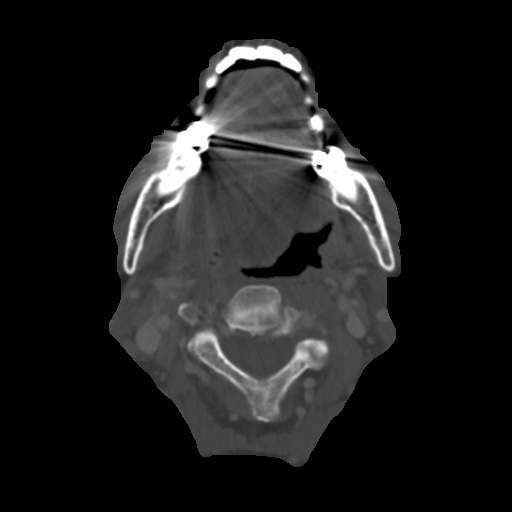

[Series 8: cor neck · coronal · 0.28mm/px · 3 of 78 slices shown]
[im 16/78  bone]
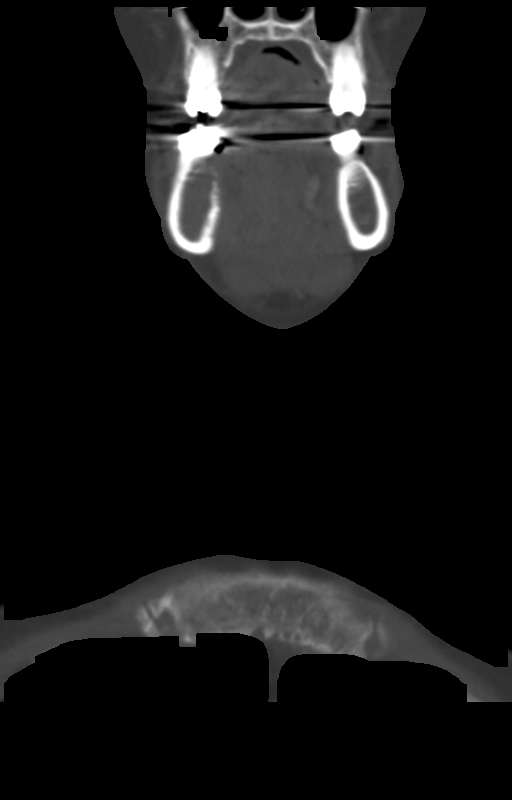
[im 31/78  bone]
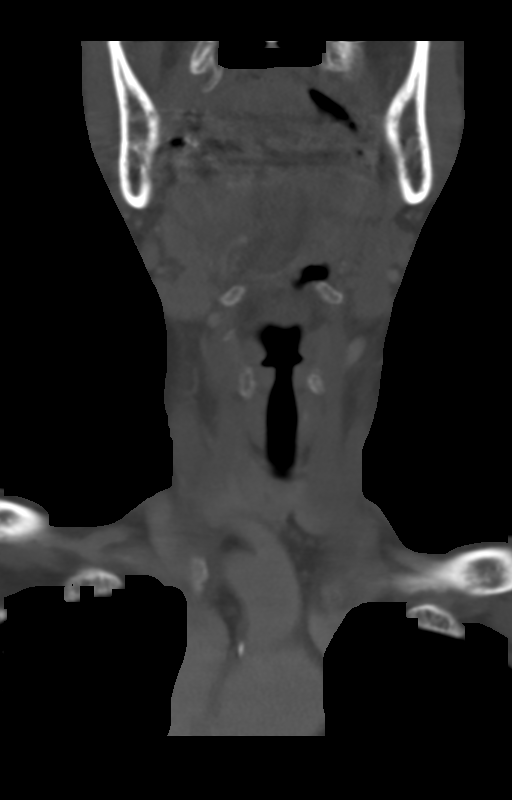
[im 47/78  bone]
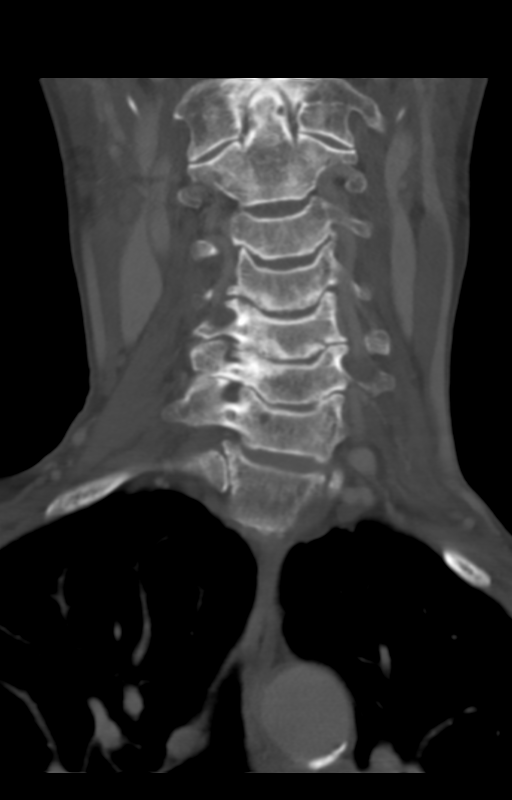

[Series 9: sag neck · sagittal · 0.34mm/px · 5 of 65 slices shown, 6 images]
[im 22/65  bone]
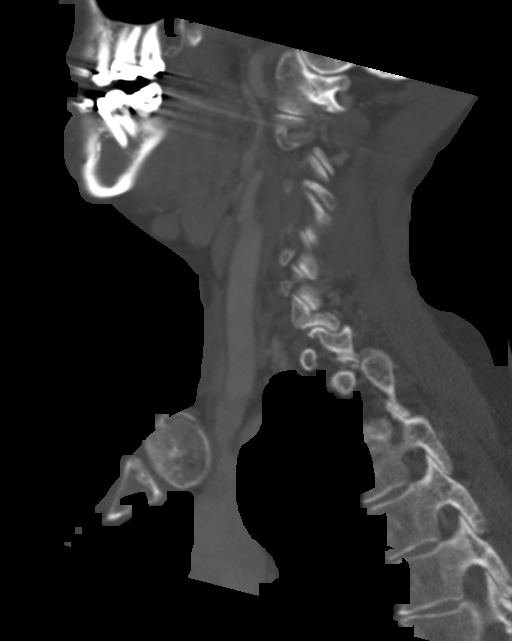
[im 27/65  bone]
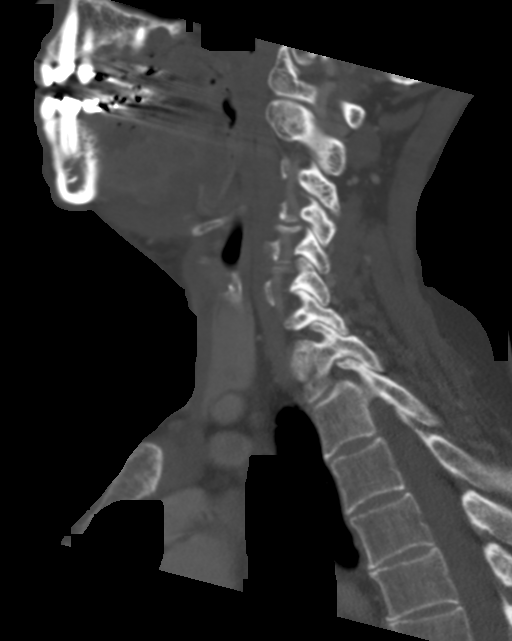
[im 33/65  soft-tissue]
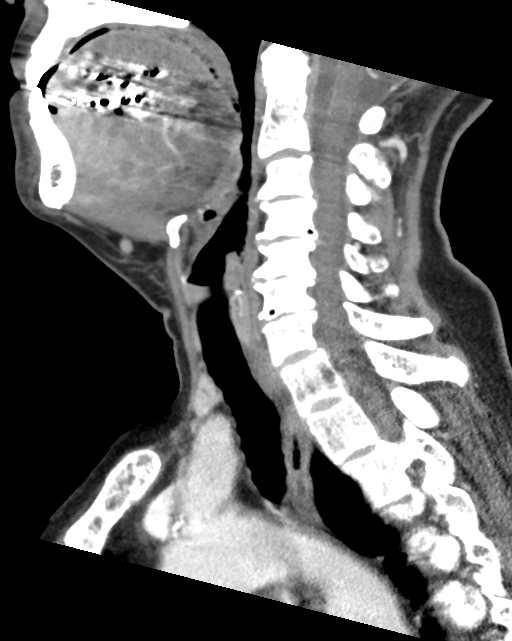
[im 33/65  bone]
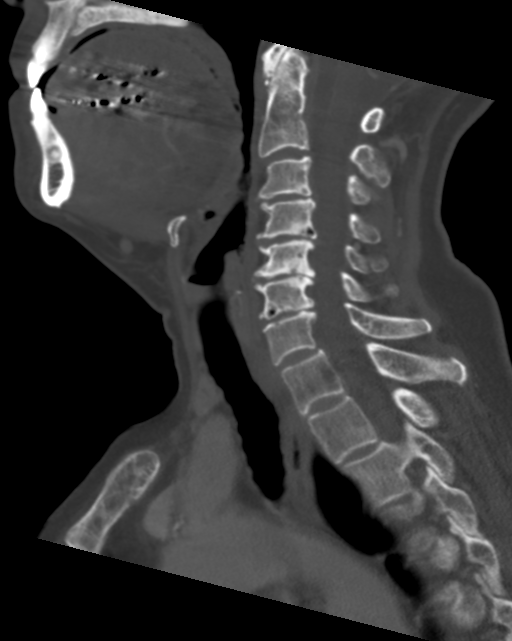
[im 38/65  bone]
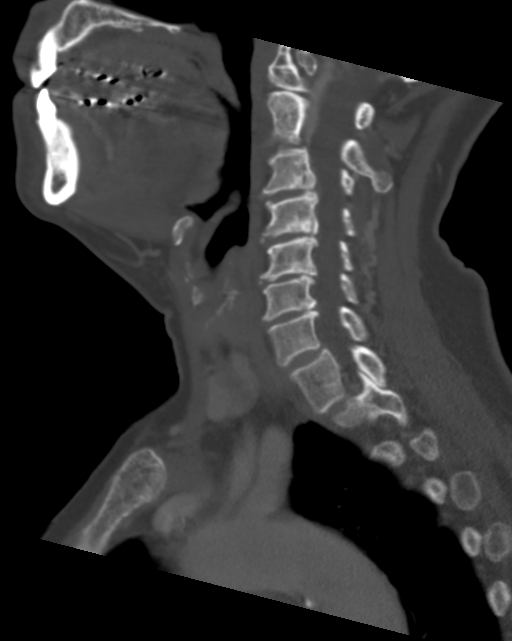
[im 43/65  bone]
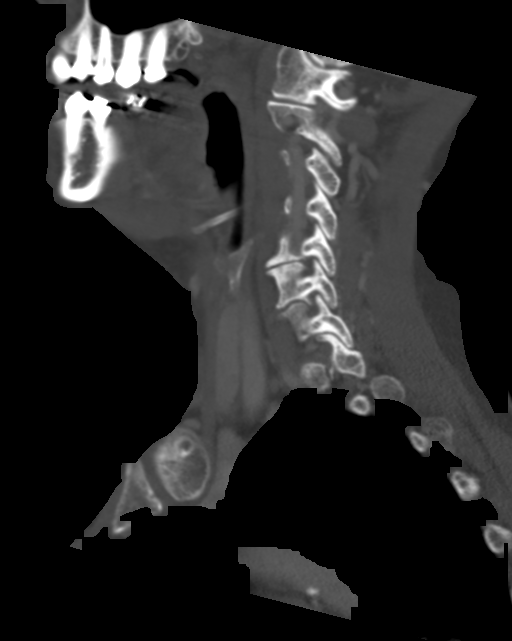

[13 of 33 positions shown; findings below may reference images not displayed]

FINDINGS: Pharynx and larynx: A large, heterogeneously enhancing, partially
necrotic mass diffusely involves the right lateral oral tongue and
floor of mouth with portions obscured by dental streak artifact. The
mass measures approximately 5.4 x 2.2 x 3.8 cm with posterior
extension to the region of the glossotonsillar sulcus. There is
right-sided intrinsic and extrinsic tongue muscle involvement with
extension to if not slightly across the midline. There are
permeative changes within the lingual cortex of the adjacent
mandible on the right.

Salivary glands: Mild dilatation of the proximal right submandibular
duct from obstruction by the oral cavity mass. Mild diffuse
hyperenhancement of the right submandibular gland. Incomplete
imaging of the superior aspects of the parotid glands.

Thyroid: Subcentimeter low-density thyroid nodules.

Lymph nodes: Small but asymmetrically enlarged right-sided lymph
nodes measure up to 8 mm in short axis in level IB and up to 7 mm in
level IIA.

Vascular: Major vascular structures of the neck are patent.

Visualized paranasal sinuses: Clear.

Skeleton: Moderate multilevel cervical disc degeneration.

Upper chest: No apical lung consolidation or mass.

Other: None.
IMPRESSION: 1. Large right lateral tongue mass consistent with malignancy with
involvement of the mandible.
2. Mildly enlarged right level I and II lymph nodes suspicious for
metastatic disease.
3. Mild right submandibular sialadenitis related to ductal
obstruction by the mass.

## 2019-06-06 DEATH — deceased
